# Patient Record
Sex: Male | Born: 1955 | Race: White | Hispanic: No | Marital: Married | Smoking: Never smoker
Health system: Southern US, Community
[De-identification: ages and names within clinical notes are randomized; demographics above are authoritative.]

## PROBLEM LIST (undated history)

## (undated) DIAGNOSIS — E785 Hyperlipidemia, unspecified: Secondary | ICD-10-CM

## (undated) DIAGNOSIS — E119 Type 2 diabetes mellitus without complications: Secondary | ICD-10-CM

## (undated) DIAGNOSIS — I10 Essential (primary) hypertension: Secondary | ICD-10-CM

## (undated) DIAGNOSIS — N189 Chronic kidney disease, unspecified: Secondary | ICD-10-CM

## (undated) DIAGNOSIS — C44329 Squamous cell carcinoma of skin of other parts of face: Secondary | ICD-10-CM

## (undated) DIAGNOSIS — Z9989 Dependence on other enabling machines and devices: Secondary | ICD-10-CM

## (undated) DIAGNOSIS — E781 Pure hyperglyceridemia: Secondary | ICD-10-CM

## (undated) DIAGNOSIS — Z8739 Personal history of other diseases of the musculoskeletal system and connective tissue: Secondary | ICD-10-CM

## (undated) DIAGNOSIS — Z98811 Dental restoration status: Secondary | ICD-10-CM

## (undated) DIAGNOSIS — M1990 Unspecified osteoarthritis, unspecified site: Secondary | ICD-10-CM

## (undated) DIAGNOSIS — E66813 Obesity, class 3: Secondary | ICD-10-CM

## (undated) DIAGNOSIS — G4733 Obstructive sleep apnea (adult) (pediatric): Secondary | ICD-10-CM

## (undated) DIAGNOSIS — H5440 Blindness, one eye, unspecified eye: Secondary | ICD-10-CM

## (undated) DIAGNOSIS — M24562 Contracture, left knee: Secondary | ICD-10-CM

## (undated) DIAGNOSIS — Z87442 Personal history of urinary calculi: Secondary | ICD-10-CM

## (undated) DIAGNOSIS — Z8489 Family history of other specified conditions: Secondary | ICD-10-CM

## (undated) DIAGNOSIS — G4730 Sleep apnea, unspecified: Secondary | ICD-10-CM

## (undated) HISTORY — DX: Hyperlipidemia, unspecified: E78.5

## (undated) HISTORY — DX: Essential (primary) hypertension: I10

## (undated) HISTORY — DX: Type 2 diabetes mellitus without complications: E11.9

## (undated) HISTORY — PX: LIPOMA EXCISION: SHX5283

## (undated) HISTORY — PX: BACK SURGERY: SHX140

## (undated) HISTORY — DX: Morbid (severe) obesity due to excess calories: E66.01

## (undated) HISTORY — PX: INGUINAL HERNIA REPAIR: SUR1180

## (undated) HISTORY — PX: JOINT REPLACEMENT: SHX530

## (undated) HISTORY — DX: Chronic kidney disease, unspecified: N18.9

## (undated) HISTORY — DX: Sleep apnea, unspecified: G47.30

## (undated) HISTORY — DX: Unspecified osteoarthritis, unspecified site: M19.90

## (undated) HISTORY — DX: Obesity, class 3: E66.813

## (undated) HISTORY — PX: KNEE ARTHROSCOPY: SHX127

---
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
## 1998-01-19 ENCOUNTER — Emergency Department: Admit: 1998-01-19 | Payer: Self-pay | Source: Emergency Department | Admitting: Emergency Medicine

## 1998-10-09 ENCOUNTER — Emergency Department: Admit: 1998-10-09 | Payer: Self-pay | Source: Emergency Department | Admitting: Emergency Medicine

## 2001-06-18 ENCOUNTER — Observation Stay
Admission: AD | Admit: 2001-06-18 | Disposition: A | Discharge status: Home / Self Care | Payer: Self-pay | Source: Ambulatory Visit | Admitting: Internal Medicine

## 2004-03-22 ENCOUNTER — Ambulatory Visit
Admit: 2004-03-22 | Disposition: A | Discharge status: Home / Self Care | Payer: Self-pay | Source: Ambulatory Visit | Admitting: Family Medicine

## 2005-05-25 ENCOUNTER — Ambulatory Visit: Payer: Self-pay | Admitting: Family Medicine

## 2005-06-15 ENCOUNTER — Ambulatory Visit: Payer: Self-pay | Admitting: Family Medicine

## 2005-06-22 ENCOUNTER — Ambulatory Visit: Payer: Self-pay | Admitting: Family Medicine

## 2005-06-23 ENCOUNTER — Ambulatory Visit: Payer: Self-pay | Admitting: Pulmonary Disease

## 2005-07-22 ENCOUNTER — Ambulatory Visit (HOSPITAL_BASED_OUTPATIENT_CLINIC_OR_DEPARTMENT_OTHER): Admission: RE | Admit: 2005-07-22 | Discharge: 2005-07-22 | Payer: Self-pay | Admitting: Pulmonary Disease

## 2005-08-01 ENCOUNTER — Ambulatory Visit: Payer: Self-pay | Admitting: Pulmonary Disease

## 2005-08-17 ENCOUNTER — Ambulatory Visit: Payer: Self-pay | Admitting: Gastroenterology

## 2005-08-29 ENCOUNTER — Ambulatory Visit: Payer: Self-pay | Admitting: Gastroenterology

## 2005-08-29 HISTORY — PX: COLONOSCOPY: SHX174

## 2005-09-07 ENCOUNTER — Ambulatory Visit: Payer: Self-pay | Admitting: Pulmonary Disease

## 2005-10-10 ENCOUNTER — Ambulatory Visit: Payer: Self-pay | Admitting: Pulmonary Disease

## 2006-01-13 ENCOUNTER — Ambulatory Visit: Payer: Self-pay | Admitting: Family Medicine

## 2006-01-26 ENCOUNTER — Ambulatory Visit: Payer: Self-pay | Admitting: Pulmonary Disease

## 2006-03-13 ENCOUNTER — Ambulatory Visit: Payer: Self-pay | Admitting: Family Medicine

## 2006-06-12 ENCOUNTER — Ambulatory Visit: Payer: Self-pay | Admitting: Family Medicine

## 2006-09-05 ENCOUNTER — Ambulatory Visit: Payer: Self-pay | Admitting: Family Medicine

## 2006-09-15 ENCOUNTER — Encounter: Admission: RE | Admit: 2006-09-15 | Discharge: 2006-09-15 | Payer: Self-pay | Admitting: Orthopedic Surgery

## 2006-10-27 ENCOUNTER — Ambulatory Visit: Payer: Self-pay | Admitting: Family Medicine

## 2007-03-26 DIAGNOSIS — I10 Essential (primary) hypertension: Secondary | ICD-10-CM

## 2007-03-26 DIAGNOSIS — J309 Allergic rhinitis, unspecified: Secondary | ICD-10-CM | POA: Insufficient documentation

## 2007-04-20 ENCOUNTER — Ambulatory Visit: Payer: Self-pay | Admitting: Family Medicine

## 2007-04-20 DIAGNOSIS — L919 Hypertrophic disorder of the skin, unspecified: Secondary | ICD-10-CM

## 2007-04-20 DIAGNOSIS — E785 Hyperlipidemia, unspecified: Secondary | ICD-10-CM | POA: Insufficient documentation

## 2007-04-20 DIAGNOSIS — L909 Atrophic disorder of skin, unspecified: Secondary | ICD-10-CM | POA: Insufficient documentation

## 2007-04-20 DIAGNOSIS — E119 Type 2 diabetes mellitus without complications: Secondary | ICD-10-CM | POA: Insufficient documentation

## 2007-04-24 LAB — CONVERTED CEMR LAB
BUN: 11 mg/dL (ref 6–23)
CO2: 31 meq/L (ref 19–32)
Creatinine,U: 231.8 mg/dL
GFR calc Af Amer: 114 mL/min
Glucose, Bld: 131 mg/dL — ABNORMAL HIGH (ref 70–99)
Microalb, Ur: 1 mg/dL (ref 0.0–1.9)
Potassium: 3.9 meq/L (ref 3.5–5.1)
Sodium: 139 meq/L (ref 135–145)
VLDL: 38 mg/dL (ref 0–40)

## 2007-05-22 ENCOUNTER — Ambulatory Visit: Payer: Self-pay | Admitting: Pulmonary Disease

## 2007-08-07 ENCOUNTER — Telehealth: Payer: Self-pay | Admitting: Family Medicine

## 2007-08-13 ENCOUNTER — Ambulatory Visit: Payer: Self-pay | Admitting: Family Medicine

## 2007-08-13 DIAGNOSIS — R972 Elevated prostate specific antigen [PSA]: Secondary | ICD-10-CM | POA: Insufficient documentation

## 2007-08-14 LAB — CONVERTED CEMR LAB: PSA: 0.38 ng/mL (ref 0.10–4.00)

## 2007-10-02 ENCOUNTER — Ambulatory Visit: Payer: Self-pay | Admitting: Family Medicine

## 2007-10-02 DIAGNOSIS — M109 Gout, unspecified: Secondary | ICD-10-CM | POA: Insufficient documentation

## 2008-01-08 ENCOUNTER — Telehealth: Payer: Self-pay | Admitting: Internal Medicine

## 2008-05-21 ENCOUNTER — Telehealth: Payer: Self-pay | Admitting: Family Medicine

## 2008-06-16 ENCOUNTER — Telehealth: Payer: Self-pay | Admitting: Family Medicine

## 2008-06-18 ENCOUNTER — Ambulatory Visit: Payer: Self-pay | Admitting: Family Medicine

## 2008-06-18 LAB — CONVERTED CEMR LAB
Ketones, urine, test strip: NEGATIVE
Nitrite: NEGATIVE
Specific Gravity, Urine: 1.015
pH: 8.5

## 2008-06-20 ENCOUNTER — Telehealth: Payer: Self-pay | Admitting: Family Medicine

## 2008-06-20 LAB — CONVERTED CEMR LAB
BUN: 11 mg/dL (ref 6–23)
Basophils Absolute: 0.1 10*3/uL (ref 0.0–0.1)
CO2: 32 meq/L (ref 19–32)
Calcium: 9.1 mg/dL (ref 8.4–10.5)
Cholesterol: 171 mg/dL (ref 0–200)
Creatinine, Ser: 0.8 mg/dL (ref 0.4–1.5)
Eosinophils Absolute: 0.4 10*3/uL (ref 0.0–0.7)
Eosinophils Relative: 6.8 % — ABNORMAL HIGH (ref 0.0–5.0)
Glucose, Bld: 137 mg/dL — ABNORMAL HIGH (ref 70–99)
LDL Cholesterol: 110 mg/dL — ABNORMAL HIGH (ref 0–99)
Lymphocytes Relative: 23.3 % (ref 12.0–46.0)
MCHC: 34.8 g/dL (ref 30.0–36.0)
MCV: 90.8 fL (ref 78.0–100.0)
Monocytes Absolute: 0.6 10*3/uL (ref 0.1–1.0)
Monocytes Relative: 8.7 % (ref 3.0–12.0)
Neutrophils Relative %: 60.4 % (ref 43.0–77.0)
Platelets: 266 10*3/uL (ref 150–400)
Potassium: 3.5 meq/L (ref 3.5–5.1)
Sodium: 142 meq/L (ref 135–145)
Total Protein: 8 g/dL (ref 6.0–8.3)
Triglycerides: 146 mg/dL (ref 0–149)
VLDL: 29 mg/dL (ref 0–40)
WBC: 6.4 10*3/uL (ref 4.5–10.5)

## 2008-06-23 ENCOUNTER — Ambulatory Visit: Payer: Self-pay | Admitting: Family Medicine

## 2008-07-02 ENCOUNTER — Ambulatory Visit: Payer: Self-pay | Admitting: Family Medicine

## 2008-07-02 DIAGNOSIS — M7660 Achilles tendinitis, unspecified leg: Secondary | ICD-10-CM

## 2008-08-26 ENCOUNTER — Ambulatory Visit: Payer: Self-pay | Admitting: Family Medicine

## 2009-07-15 ENCOUNTER — Telehealth: Payer: Self-pay | Admitting: Family Medicine

## 2009-07-24 ENCOUNTER — Ambulatory Visit: Payer: Self-pay | Admitting: Family Medicine

## 2009-07-27 LAB — CONVERTED CEMR LAB
ALT: 29 units/L (ref 0–53)
Albumin: 4.2 g/dL (ref 3.5–5.2)
Alkaline Phosphatase: 47 units/L (ref 39–117)
Basophils Absolute: 0.1 10*3/uL (ref 0.0–0.1)
Basophils Relative: 0.8 % (ref 0.0–3.0)
Chloride: 101 meq/L (ref 96–112)
Cholesterol: 177 mg/dL (ref 0–200)
Creatinine, Ser: 0.8 mg/dL (ref 0.4–1.5)
Eosinophils Absolute: 0.5 10*3/uL (ref 0.0–0.7)
Eosinophils Relative: 7.2 % — ABNORMAL HIGH (ref 0.0–5.0)
HCT: 43.5 % (ref 39.0–52.0)
Lymphocytes Relative: 25.6 % (ref 12.0–46.0)
Lymphs Abs: 1.8 10*3/uL (ref 0.7–4.0)
Microalb Creat Ratio: 4.3 mg/g (ref 0.0–30.0)
Monocytes Relative: 7.9 % (ref 3.0–12.0)
PSA: 0.49 ng/mL (ref 0.10–4.00)
Potassium: 2.9 meq/L — ABNORMAL LOW (ref 3.5–5.1)
RBC: 4.61 M/uL (ref 4.22–5.81)
RDW: 12.8 % (ref 11.5–14.6)
Sodium: 138 meq/L (ref 135–145)
Total Bilirubin: 0.8 mg/dL (ref 0.3–1.2)
Total CHOL/HDL Ratio: 5
Total Protein: 8.3 g/dL (ref 6.0–8.3)
Triglycerides: 203 mg/dL — ABNORMAL HIGH (ref 0.0–149.0)
VLDL: 40.6 mg/dL — ABNORMAL HIGH (ref 0.0–40.0)

## 2009-07-28 ENCOUNTER — Ambulatory Visit: Payer: Self-pay | Admitting: Family Medicine

## 2009-08-04 DIAGNOSIS — R3129 Other microscopic hematuria: Secondary | ICD-10-CM | POA: Insufficient documentation

## 2009-08-04 LAB — CONVERTED CEMR LAB
Bilirubin Urine: NEGATIVE
Glucose, Urine, Semiquant: NEGATIVE
Ketones, urine, test strip: NEGATIVE
Nitrite: NEGATIVE
Protein, U semiquant: NEGATIVE
Specific Gravity, Urine: 1.02
Urobilinogen, UA: 1
WBC Urine, dipstick: NEGATIVE
pH: 6.5

## 2009-08-15 HISTORY — PX: CYSTOSCOPY: SUR368

## 2009-08-24 ENCOUNTER — Encounter: Payer: Self-pay | Admitting: Family Medicine

## 2009-09-04 ENCOUNTER — Encounter: Payer: Self-pay | Admitting: Family Medicine

## 2009-09-24 ENCOUNTER — Encounter: Payer: Self-pay | Admitting: Family Medicine

## 2009-10-21 ENCOUNTER — Ambulatory Visit: Payer: Self-pay | Admitting: Family Medicine

## 2009-10-22 LAB — CONVERTED CEMR LAB
ALT: 39 units/L (ref 0–53)
Albumin: 3.9 g/dL (ref 3.5–5.2)
Alkaline Phosphatase: 56 units/L (ref 39–117)
BUN: 15 mg/dL (ref 6–23)
Basophils Absolute: 0.1 10*3/uL (ref 0.0–0.1)
Bilirubin Urine: NEGATIVE
CO2: 28 meq/L (ref 19–32)
Creatinine, Ser: 0.8 mg/dL (ref 0.4–1.5)
Eosinophils Absolute: 0.5 10*3/uL (ref 0.0–0.7)
Eosinophils Relative: 7 % — ABNORMAL HIGH (ref 0.0–5.0)
HCT: 40.6 % (ref 39.0–52.0)
Ketones, ur: NEGATIVE mg/dL
Leukocytes, UA: NEGATIVE
Lymphocytes Relative: 27 % (ref 12.0–46.0)
MCV: 91.9 fL (ref 78.0–100.0)
Monocytes Relative: 9.9 % (ref 3.0–12.0)
Neutrophils Relative %: 54.9 % (ref 43.0–77.0)
RBC: 4.42 M/uL (ref 4.22–5.81)
RDW: 12.5 % (ref 11.5–14.6)
TSH: 1.86 microintl units/mL (ref 0.35–5.50)
Total Protein, Urine: NEGATIVE mg/dL
Total Protein: 7.6 g/dL (ref 6.0–8.3)
Urine Glucose: NEGATIVE mg/dL
VLDL: 80 mg/dL — ABNORMAL HIGH (ref 0.0–40.0)
pH: 6 (ref 5.0–8.0)

## 2009-10-28 ENCOUNTER — Ambulatory Visit: Payer: Self-pay | Admitting: Family Medicine

## 2009-10-28 DIAGNOSIS — M1712 Unilateral primary osteoarthritis, left knee: Secondary | ICD-10-CM | POA: Insufficient documentation

## 2009-10-30 ENCOUNTER — Ambulatory Visit: Payer: Self-pay | Admitting: Family Medicine

## 2010-06-23 ENCOUNTER — Ambulatory Visit: Payer: Self-pay | Admitting: Family Medicine

## 2010-06-24 LAB — CONVERTED CEMR LAB
AST: 27 units/L (ref 0–37)
BUN: 17 mg/dL (ref 6–23)
Bilirubin, Direct: 0.1 mg/dL (ref 0.0–0.3)
CO2: 31 meq/L (ref 19–32)
Calcium: 9.7 mg/dL (ref 8.4–10.5)
Chloride: 102 meq/L (ref 96–112)
Cholesterol: 165 mg/dL (ref 0–200)
Creatinine, Ser: 0.8 mg/dL (ref 0.4–1.5)
HCT: 43 % (ref 39.0–52.0)
HDL: 34.8 mg/dL — ABNORMAL LOW (ref >39.00)
Hgb A1c MFr Bld: 6.4 % (ref 4.6–6.5)
LDL Cholesterol: 92 mg/dL (ref 0–99)
Lymphocytes Relative: 27.3 % (ref 12.0–46.0)
MCHC: 34.5 g/dL (ref 30.0–36.0)
Neutrophils Relative %: 58.7 % (ref 43.0–77.0)
Potassium: 4.4 meq/L (ref 3.5–5.1)
RBC: 4.67 M/uL (ref 4.22–5.81)
Sodium: 142 meq/L (ref 135–145)
Total Bilirubin: 0.6 mg/dL (ref 0.3–1.2)
Total CHOL/HDL Ratio: 5
Triglycerides: 193 mg/dL — ABNORMAL HIGH (ref 0.0–149.0)

## 2010-09-02 ENCOUNTER — Telehealth: Payer: Self-pay | Admitting: Family Medicine

## 2010-09-13 ENCOUNTER — Telehealth: Payer: Self-pay | Admitting: Family Medicine

## 2010-09-14 NOTE — Assessment & Plan Note (Signed)
Summary: med check/refills/pt coming in fasting/cjr   Vital Signs:  Patient profile:   55 year old male Weight:      315 pounds O2 Sat:      94 % Temp:     97.9 degrees F Pulse rate:   73 / minute BP sitting:   134 / 80  (left arm) Cuff size:   large  Vitals Entered By: Pura Spice, RN (June 23, 2010 8:43 AM)  Contraindications/Deferment of Procedures/Staging:    Test/Procedure: FLU VAX    Reason for deferment: patient declined  CC: med ck and refils states has not taking tricor or k+ since august   History of Present Illness: Here for follow up. He is fasting. He feels well although he is still putting up with chronic left knee pain. uses Advil as needed . He stopped taking the Tricor and the potassium pills 3 months ago, and he cannot really tell me why today. he does not check his BP or his glucoses.   Allergies: No Known Drug Allergies  Past History:  Past Medical History: Reviewed history from 10/28/2009 and no changes required. Allergic rhinitis Hypertension Gout Diabetes mellitus, type II Hyperlipidemia Osteoarthritis microhematuria, benign, worked up with cystoscopy and CT 09-2009 per Dr. Larey Dresser  Review of Systems  The patient denies anorexia, fever, weight loss, weight gain, vision loss, decreased hearing, hoarseness, chest pain, syncope, dyspnea on exertion, peripheral edema, prolonged cough, headaches, hemoptysis, abdominal pain, melena, hematochezia, severe indigestion/heartburn, hematuria, incontinence, genital sores, muscle weakness, suspicious skin lesions, transient blindness, difficulty walking, depression, unusual weight change, abnormal bleeding, enlarged lymph nodes, angioedema, breast masses, and testicular masses.    Physical Exam  General:  overweight-appearing.   Neck:  No deformities, masses, or tenderness noted. Lungs:  Normal respiratory effort, chest expands symmetrically. Lungs are clear to auscultation, no crackles or  wheezes. Heart:  Normal rate and regular rhythm. S1 and S2 normal without gallop, murmur, click, rub or other extra sounds.   Impression & Recommendations:  Problem # 1:  DIABETES MELLITUS, TYPE II (ICD-250.00)  His updated medication list for this problem includes:    Metformin Hcl 500 Mg Tabs (Metformin hcl) ..... One by mouth two times a day  Orders: Venipuncture (16109) TLB-Lipid Panel (80061-LIPID) TLB-BMP (Basic Metabolic Panel-BMET) (80048-METABOL) TLB-CBC Platelet - w/Differential (85025-CBCD) TLB-Hepatic/Liver Function Pnl (80076-HEPATIC) TLB-TSH (Thyroid Stimulating Hormone) (84443-TSH) TLB-A1C / Hgb A1C (Glycohemoglobin) (83036-A1C)  Problem # 2:  HYPERLIPIDEMIA (ICD-272.4)  His updated medication list for this problem includes:    Tricor 145 Mg Tabs (Fenofibrate) .Marland Kitchen... 1 by mouth once daily  Problem # 3:  HYPERTENSION (ICD-401.9)  His updated medication list for this problem includes:    Tenoretic 50 50-25 Mg Tabs (Atenolol-chlorthalidone) .Marland Kitchen... Take 1 tablet by mouth once a day  Complete Medication List: 1)  Tenoretic 50 50-25 Mg Tabs (Atenolol-chlorthalidone) .... Take 1 tablet by mouth once a day 2)  Metformin Hcl 500 Mg Tabs (Metformin hcl) .... One by mouth two times a day 3)  Klor-con M20 20 Meq Cr-tabs (Potassium chloride crys cr) .Marland Kitchen.. 1 by mouth two times a day 4)  Tricor 145 Mg Tabs (Fenofibrate) .Marland Kitchen.. 1 by mouth once daily  Patient Instructions: 1)  get labs today. 2)  It is important that you exercise reguarly at least 20 minutes 5 times a week. If you develop chest pain, have severe difficulty breathing, or feel very tired, stop exercising immediately and seek medical attention.  3)  You need to  lose weight. Consider a lower calorie diet and regular exercise.  Prescriptions: METFORMIN HCL 500 MG TABS (METFORMIN HCL) one by mouth two times a day  #60 x 11   Entered and Authorized by:   Nelwyn Salisbury MD   Signed by:   Nelwyn Salisbury MD on 06/23/2010    Method used:   Electronically to        [facility] [address] [phone]* (retail)       [address]       [place], [place]  [address]       Ph: [phone] or [phone]       Fax: [phone]   RxID:   [id number] TENORETIC 50 50-25 MG  TABS (ATENOLOL-CHLORTHALIDONE) Take 1 tablet by mouth once a day  #30 x 11   Entered and Authorized by:   Nelwyn Salisbury MD   Signed by:   Nelwyn Salisbury MD on 06/23/2010   Method used:   Electronically to        [facility] [address] [phone]* (retail)       [address]       [place], [place]  [address]       Ph: [phone] or [phone]       Fax: [phone]   RxID:   [id number]    Orders Added: 1)  Est. Patient Level IV [70623] 2)  Venipuncture [76283] 3)  TLB-Lipid Panel [80061-LIPID] 4)  TLB-BMP (Basic Metabolic Panel-BMET) [80048-METABOL] 5)  TLB-CBC Platelet - w/Differential [85025-CBCD] 6)  TLB-Hepatic/Liver Function Pnl [80076-HEPATIC] 7)  TLB-TSH (Thyroid Stimulating Hormone) [84443-TSH] 8)  TLB-A1C / Hgb A1C (Glycohemoglobin) [83036-A1C]  Appended Document: Orders Update    Clinical Lists Changes  Orders: Added new Service order of Specimen Handling (15176) - Signed

## 2010-09-14 NOTE — Letter (Signed)
Summary: [facility]  [facility]   Imported By: Maryln Gottron 09/30/2009 14:34:12  _____________________________________________________________________  External Attachment:    Type:   Image     Comment:   External Document

## 2010-09-14 NOTE — Assessment & Plan Note (Signed)
Summary: [name] // RS   Vital Signs:  Patient profile:   55 year old male Weight:      316 pounds BMI:     43.01 BP sitting:   138 / 70  (left arm) Cuff size:   large  Vitals Entered By: Raechel Ache, RN (October 28, 2009 1:43 PM) CC: [name], labs done.   History of Present Illness: 55 yr old male for cpx. He feels good in general except for his chronic left knee pain. he has severe end stage arthritis in the knee, and the onpy real next step is a total replacement. he has seen Dr. August Saucer about htis in the pasy, and now he is considering getting a second opinion from Dr. Farris Has. He tries to watch his diet but gets very little exercise.   Preventive Screening-Counseling & Management  Alcohol-Tobacco     Smoking Status: never  Allergies (verified): No Known Drug Allergies  Past History:  Past Medical History: Allergic rhinitis Hypertension Gout Diabetes mellitus, type II Hyperlipidemia Osteoarthritis microhematuria, benign, worked up with cystoscopy and CT 09-2009 per Dr. Larey Dresser  Past Surgical History: traumatic loss of left eye 1961 left inguinal hernia repair age 25 excision of lipoma left upper back 1997 left knee arthroscopy 1999 colonoscopy 08-29-05 per Dr. Jarold Motto, diverticulosis only, repeat in 10 yrs cystoscopy 09-2009 per Dr. Vonita Moss, clear  Family History: Reviewed history and no changes required. Family History of Alcoholism/Addiction Family History of CAD Male 1st degree relative <50 Family History Hypertension Family History Kidney disease Family History of Stroke F 1st degree relative <60 lupus  Social History: Reviewed history and no changes required. Occupation: Psychologist, occupational Married Never Smoked Alcohol use-yes Occupation:  employed Smoking Status:  never  Review of Systems  The patient denies anorexia, fever, weight loss, weight gain, vision loss, decreased hearing, hoarseness, chest pain, syncope, dyspnea on exertion, peripheral edema,  prolonged cough, headaches, hemoptysis, abdominal pain, melena, hematochezia, severe indigestion/heartburn, hematuria, incontinence, genital sores, muscle weakness, suspicious skin lesions, transient blindness, difficulty walking, depression, unusual weight change, abnormal bleeding, enlarged lymph nodes, angioedema, breast masses, and testicular masses.    Physical Exam  General:  overweight-appearing.   Head:  Normocephalic and atraumatic without obvious abnormalities. No apparent alopecia or balding. Eyes:  No corneal or conjunctival inflammation noted. EOMI. Perrla. Funduscopic exam benign, without hemorrhages, exudates or papilledema. Vision grossly normal. Ears:  External ear exam shows no significant lesions or deformities.  Otoscopic examination reveals clear canals, tympanic membranes are intact bilaterally without bulging, retraction, inflammation or discharge. Hearing is grossly normal bilaterally. Nose:  External nasal examination shows no deformity or inflammation. Nasal mucosa are pink and moist without lesions or exudates. Mouth:  Oral mucosa and oropharynx without lesions or exudates.  Teeth in good repair. Neck:  No deformities, masses, or tenderness noted. Chest Wall:  No deformities, masses, tenderness or gynecomastia noted. Lungs:  Normal respiratory effort, chest expands symmetrically. Lungs are clear to auscultation, no crackles or wheezes. Heart:  Normal rate and regular rhythm. S1 and S2 normal without gallop, murmur, click, rub or other extra sounds. EKG normal Abdomen:  Bowel sounds positive,abdomen soft and non-tender without masses, organomegaly or hernias noted. Rectal:  No external abnormalities noted. Normal sphincter tone. No rectal masses or tenderness. Heme neg.  Genitalia:  Testes bilaterally descended without nodularity, tenderness or masses. No scrotal masses or lesions. No penis lesions or urethral discharge. Prostate:  Prostate gland firm and smooth, no  enlargement, nodularity, tenderness, mass, asymmetry or  induration. Msk:  No deformity or scoliosis noted of thoracic or lumbar spine.   Pulses:  R and L carotid,radial,femoral,dorsalis pedis and posterior tibial pulses are full and equal bilaterally Extremities:  No clubbing, cyanosis, edema, or deformity noted with normal full range of motion of all joints.   Neurologic:  No cranial nerve deficits noted. Station and gait are normal. Plantar reflexes are down-going bilaterally. DTRs are symmetrical throughout. Sensory, motor and coordinative functions appear intact. Skin:  Intact without suspicious lesions or rashes Cervical Nodes:  No lymphadenopathy noted Axillary Nodes:  No palpable lymphadenopathy Inguinal Nodes:  No significant adenopathy Psych:  Cognition and judgment appear intact. Alert and cooperative with normal attention span and concentration. No apparent delusions, illusions, hallucinations   Impression & Recommendations:  Problem # 1:  PHYSICAL EXAMINATION (ICD-V70.0)  Orders: EKG w/ Interpretation (93000) Hemoccult Guaiac-1 spec.(in office) (82270)  Complete Medication List: 1)  Tenoretic 50 50-25 Mg Tabs (Atenolol-chlorthalidone) .... Take 1 tablet by mouth once a day 2)  Metformin Hcl 500 Mg Tabs (Metformin hcl) .... One by mouth two times a day 3)  Klor-con M20 20 Meq Cr-tabs (Potassium chloride crys cr) .Marland Kitchen.. 1 by mouth two times a day 4)  Tricor 145 Mg Tabs (Fenofibrate) .Marland Kitchen.. 1 by mouth once daily  Other Orders: Tdap => 17yrs IM (36644) Admin 1st Vaccine (03474)  Patient Instructions: 1)  It is important that you exercise reguarly at least 20 minutes 5 times a week. If you develop chest pain, have severe difficulty breathing, or feel very tired, stop exercising immediately and seek medical attention.  2)  You need to lose weight. Consider a lower calorie diet and regular exercise.  3)  we will draw an A1c soon to follow his diabetes.  4)  See Dr. Farris Has about his  knee   Immunizations Administered:  Tetanus Vaccine:    Vaccine Type: Tdap    Site: left deltoid    Mfr: GlaxoSmithKline    Dose: 0.5 ml    Route: IM    Given by: Raechel Ache, RN    Exp. Date: 06/10/2010    Lot #: [id number]    VIS given: 07/03/07 version given October 28, 2009.

## 2010-09-14 NOTE — Consult Note (Signed)
Summary: [facility]  [facility]   Imported By: Maryln Gottron 09/01/2009 10:28:49  _____________________________________________________________________  External Attachment:    Type:   Image     Comment:   External Document

## 2010-09-16 NOTE — Progress Notes (Signed)
Summary: please send  alternate  Phone Note Call from Patient Call back at Work Phone [phone]   Caller: Patient---live call Summary of Call:  atenolol/hctz is no longer available at his pharmacy. please of alternate. please send to [facility]. pt is out of med. Initial call taken by: Warnell Forester,  September 02, 2010 4:28 PM  Follow-up for Phone Call        [facility] wants to know if can split it to atenolol and chlorthalidone  separately? Follow-up by: Pura Spice, RN,  September 03, 2010 8:04 AM  Additional Follow-up for Phone Call Additional follow up Details #1::        okay, chage to Chlorthalidone 25 mg once daily and Atenolol 50 mg once daily . Call in one year of both  Additional Follow-up by: Nelwyn Salisbury MD,  September 03, 2010 2:30 PM    Additional Follow-up for Phone Call Additional follow up Details #2::    called in  Follow-up by: Pura Spice, RN,  September 03, 2010 2:50 PM  New/Updated Medications: ATENOLOL 50 MG TABS (ATENOLOL) 1 by mouth once daily CHLORTHALIDONE 25 MG TABS (CHLORTHALIDONE) 1 by mouth once daily Prescriptions: CHLORTHALIDONE 25 MG TABS (CHLORTHALIDONE) 1 by mouth once daily  #30 x 11   Entered by:   Pura Spice, RN   Authorized by:   Nelwyn Salisbury MD   Signed by:   Pura Spice, RN on 09/03/2010   Method used:   Electronically to        [facility] [phone]* (retail)       [address]       [place], [place]  [address]       Ph: [phone]       Fax: [phone]   RxID:   [id number] ATENOLOL 50 MG TABS (ATENOLOL) 1 by mouth once daily  #30 x 11   Entered by:   Pura Spice, RN   Authorized by:   Nelwyn Salisbury MD   Signed by:   Pura Spice, RN on 09/03/2010   Method used:   Electronically to        [facility] [phone]* (retail)       [address]       [place], [place]  [address]       Ph: [phone]       Fax: [phone]   RxID:   [id number]

## 2010-09-22 NOTE — Progress Notes (Signed)
Summary: ? MED   Phone Note Call from Patient   Caller: Patient Call For: Nelwyn Salisbury MD Summary of Call: Pt is having a gout flare in Left big toe.  Extremely painful.  Is taking Atenolol and wonders if that is the reason it is worse. Karin Golden ([place])  Initial call taken by: Lynann Beaver CMA AAMA,  September 13, 2010 8:27 AM  Follow-up for Phone Call        No I doubt the Atenolol has much to do with this  Follow-up by: Nelwyn Salisbury MD,  September 13, 2010 8:55 AM  Additional Follow-up for Phone Call Additional follow up Details #1::        Needs meds, please. Pt is calling back for meds.....states in a lot of pain. Additional Follow-up by: Lynann Beaver CMA AAMA,  September 13, 2010 10:53 AM    Additional Follow-up for Phone Call Additional follow up Details #2::    call in Indocin 50 mg three times a day as needed for gout, #60 with 5 rf  Follow-up by: Nelwyn Salisbury MD,  September 13, 2010 10:06 AM  New/Updated Medications: INDOMETHACIN 50 MG CAPS (INDOMETHACIN) one three times a day Prescriptions: INDOMETHACIN 50 MG CAPS (INDOMETHACIN) one three times a day  #60 x 3   Entered by:   Lynann Beaver CMA AAMA   Authorized by:   Nelwyn Salisbury MD   Signed by:   Lynann Beaver CMA AAMA on 09/13/2010   Method used:   Electronically to        [facility] [address] (retail)       [address]       [place], [place]  [address]       Ph: [phone] or [phone]       Fax: [phone]   RxID:   [id number]  Notified pt.

## 2010-12-31 NOTE — Procedures (Signed)
NAME:  Jermaine James, BOZE NO.:  [id number]   MEDICAL RECORD NO.:  [id number]          PATIENT TYPE:  OUT   LOCATION:  SLEEP CENTER                 FACILITY:  [facility]   PHYSICIAN:  Marcelyn Bruins, M.D. [facility] DATE OF BIRTH:  04/22/1956   DATE OF STUDY:  07/22/2005                              NOCTURNAL POLYSOMNOGRAM   REFERRING PHYSICIAN:  Marcelyn Bruins, M.D. [facility].   DATE OF STUDY:  July 22, 2005.   INDICATION FOR STUDY:  Hypersomnia with sleep apnea.   EPWORTH SLEEPINESS SCORE:  10.   SLEEP ARCHITECTURE:  The patient had a total sleep time of 284 minutes but  never achieved REM or slow wave sleep. Sleep onset latency was normal at 12  minutes. Sleep efficiency was poor and quite fragmented at 75%.   RESPIRATORY DATA:  The patient was found to have 466 apneas with 1 central  apnea and 12 hypopneas. This gave the patient a respiratory disturbance  index of 101 events per hour. Events were not positional but there was  moderate snoring noted.   OXYGEN DATA:  The patient had O2 desaturation as low as 69% with his  obstructive events.   CARDIAC DATA:  No clinically significant cardiac arrhythmias.   MOVEMENT/PARASOMNIAS:  None.   IMPRESSION/RECOMMENDATIONS:  Very severe obstructive sleep apnea with a  respiratory disturbance index of 101 events per hour and O2 desaturation as  low as 69%. Treatment for this degree of sleep apnea should focus on weight  loss coupled with C-PAP.           ______________________________  Marcelyn Bruins, M.D. [facility]  Diplomate, American Board of Sleep  Medicine     KC/MEDQ  D:  08/03/2005 11:32:45  T:  08/03/2005 22:41:39  Job:  [id number]

## 2011-02-14 ENCOUNTER — Ambulatory Visit (HOSPITAL_COMMUNITY)
Admission: RE | Admit: 2011-02-14 | Discharge: 2011-02-14 | Disposition: A | Discharge status: Home / Self Care | Payer: BC Managed Care – PPO | Source: Ambulatory Visit | Attending: Family Medicine | Admitting: Family Medicine

## 2011-02-14 ENCOUNTER — Ambulatory Visit (INDEPENDENT_AMBULATORY_CARE_PROVIDER_SITE_OTHER): Payer: BC Managed Care – PPO | Admitting: Family Medicine

## 2011-02-14 ENCOUNTER — Encounter: Payer: Self-pay | Admitting: Family Medicine

## 2011-02-14 VITALS — BP 110/72 | HR 86 | Temp 97.7°F

## 2011-02-14 DIAGNOSIS — M545 Low back pain, unspecified: Secondary | ICD-10-CM | POA: Insufficient documentation

## 2011-02-14 DIAGNOSIS — M25559 Pain in unspecified hip: Secondary | ICD-10-CM

## 2011-02-14 DIAGNOSIS — M25551 Pain in right hip: Secondary | ICD-10-CM

## 2011-02-14 DIAGNOSIS — M47817 Spondylosis without myelopathy or radiculopathy, lumbosacral region: Secondary | ICD-10-CM | POA: Insufficient documentation

## 2011-02-14 MED ORDER — HYDROCODONE-ACETAMINOPHEN 7.5-500 MG PO TABS: 1.0000 | ORAL_TABLET | Freq: Four times a day (QID) | ORAL | Status: AC | PRN

## 2011-02-14 MED ORDER — KETOROLAC TROMETHAMINE 60 MG/2ML IM SOLN
60.0000 mg | Freq: Once | INTRAMUSCULAR | Status: AC
  Administered 2011-02-14: 60 mg via INTRAMUSCULAR

## 2011-02-14 MED ORDER — PREDNISONE (PAK) 10 MG PO TABS: ORAL_TABLET | ORAL | Status: DC

## 2011-02-14 NOTE — Progress Notes (Signed)
  Subjective:    Patient ID: Jermaine James, male    DOB: 01/28/1956, 55 y.o.   MRN: [id number]  HPI Here for the sudden onset of severe sharp pain in the right lower back just above the hip area. This started 3 days ago when he woke up with it. No recent falls or trauma. He has never felt this before. The pain does not radiate anywhere. No leg symptoms. Using Motrin.    Review of Systems  Constitutional: Negative.   Musculoskeletal: Positive for back pain.       Objective:   Physical Exam  Constitutional:       In severe pain, can barely walk   Musculoskeletal:       Tender over the left lower back just above the pelvis. Some spasm is present. Reduced ROM of the spine. The hip appears to be intact          Assessment & Plan:  This seems to be coming from the lower spine, but we will get Xrays of the lumbar spine and hip. Use prednisone and Vicodin. Use ice packs.

## 2011-02-15 ENCOUNTER — Telehealth: Payer: Self-pay | Admitting: Family Medicine

## 2011-02-15 NOTE — Telephone Encounter (Signed)
Pt called req to get xray results. Pls call asap.  °

## 2011-02-17 ENCOUNTER — Telehealth: Payer: Self-pay | Admitting: *Deleted

## 2011-02-17 NOTE — Progress Notes (Signed)
See phone note

## 2011-02-17 NOTE — Telephone Encounter (Signed)
Spoke with pt and gave results. 

## 2011-02-17 NOTE — Telephone Encounter (Signed)
See the report. 

## 2011-02-17 NOTE — Telephone Encounter (Signed)
I spoke with pt and gave results. He is doing much better.

## 2011-02-17 NOTE — Telephone Encounter (Signed)
Message copied by Romualdo Bolk on Thu Feb 17, 2011 11:36 AM ------      Message from: Gershon Crane A      Created: Tue Feb 15, 2011 12:07 PM       Mild arthritis only. If the pain does not improve in a few days, have him see me again

## 2011-02-17 NOTE — Telephone Encounter (Signed)
Left message to call back  

## 2011-03-17 ENCOUNTER — Telehealth: Payer: Self-pay | Admitting: Family Medicine

## 2011-03-17 DIAGNOSIS — E119 Type 2 diabetes mellitus without complications: Secondary | ICD-10-CM

## 2011-03-17 NOTE — Telephone Encounter (Signed)
The referral was ordered and DM said they would contact the pt.

## 2011-03-17 NOTE — Telephone Encounter (Signed)
Diabetes Management called and stated that pt should have a referral order.

## 2011-03-17 NOTE — Telephone Encounter (Signed)
done

## 2011-04-14 ENCOUNTER — Encounter: Payer: BC Managed Care – PPO | Attending: Family Medicine | Admitting: *Deleted

## 2011-04-14 DIAGNOSIS — Z713 Dietary counseling and surveillance: Secondary | ICD-10-CM | POA: Insufficient documentation

## 2011-04-14 DIAGNOSIS — E119 Type 2 diabetes mellitus without complications: Secondary | ICD-10-CM | POA: Insufficient documentation

## 2011-04-14 NOTE — Progress Notes (Signed)
  Medical Nutrition Therapy:  Appt start time:  8:00a  end time:  9:00a  Assessment:  Primary concerns today: T2DM. Pt here for T2DM education (most recent A1c 6.4%; 06/2008).  Reports many dietary changes since last A1c. Used to drink (3-4) 20 oz regular cokes daily; none since 04/2010. Loves ice cream and has daily for dessert (1.5 c).  Has decreased red meat, eats more chicken.  Eats out daily for lunch and often dinner too.  Excessive CHO noted in recall.  Pt exercises 2-3 d/wk at pool.   MEDICATIONS:  . atenolol (TENORMIN) 50 MG tablet  . chlorthalidone (HYGROTON) 25 MG tablet  . metFORMIN (GLUCOPHAGE) 500 MG tablet  . indomethacin (INDOCIN) 50 MG capsule  . predniSONE (DELTASONE) 10 MG tablet pack    DIETARY INTAKE:  Usual eating pattern includes 3 meals and 1-2 snacks per day.  Everyday foods include ice cream.  Avoided foods include none.   24-hr recall:  B (AM): Cheerios (1.5 c) w/ 1% milk; sometimes OJ (not often)  Snk (AM): 20 oz water  L (PM): Eats out daily; [employer] or diner - tuna on wheat (6"), chips; unsweet tea w/ 1 sugar pkt Snk ( PM): None or 2-3 hersheys "chunks"; rest of tea from lunch D (PM): Kick [employer] - chicken tenders and shrimp w/ honey mustard, salad w/ thousand island dressing, (1) beer OR grilled chicken or steak w/ green beans, corn OR pasta;  Snk ( PM): ice cream  Usual physical activity: 2-3x/week at gym - pool walking, swimming.  Estimated energy needs: 1600-1800 calories 180-200 g carbohydrates 120-130 g protein 45-50 g fat  Progress Towards Goal(s):  NEW.   Nutritional Diagnosis:  [place]-2.1 Inpaired nutrition utilization related to excessive CHO intake as evidenced by patient-reported 24-hour dietary and MD referral for DM assessment.    Intervention/Goals:  Follow Diabetes Meal Plan as instructed (see yellow card).  Limit carbohydrate intake to 60 grams/meal and 15 grams/snack.  Add lean protein foods to all meals/snacks.  Daily Nutrient  Goals: 25-30 g fiber, <2000 mg sodium, <300 mg dietary cholesterol  Limit concentrated sweets, high fat/fried foods, and meals away from home.   Aim for 60 mins of physical activity daily (2-3 day/week, then gradually increase as able).  Handouts given during visit include:  60 g CHO menu  CHO/Pro snack list  Understanding DM booklet ([employer])  Foot care handout  Fiber foods list  Monitoring/Evaluation:  Dietary intake, exercise, A1c, and body weight in 4-6 week(s).

## 2011-04-14 NOTE — Patient Instructions (Addendum)
Goals:  Follow Diabetes Meal Plan as instructed (see yellow card).  Limit carbohydrate intake to 60 grams/meal and 15 grams/snack.  Add lean protein foods to all meals/snacks.  Daily Nutrient Goals: 25-30 g fiber, <2000 mg sodium, <300 mg dietary cholesterol  Limit concentrated sweets, high fat/fried foods, and meals away from home.   Aim for 60 mins of physical activity daily (2-3 day/week, then gradually increase as able).

## 2011-04-15 ENCOUNTER — Encounter: Payer: Self-pay | Admitting: *Deleted

## 2011-07-04 ENCOUNTER — Encounter: Payer: Self-pay | Admitting: Family Medicine

## 2011-07-04 ENCOUNTER — Ambulatory Visit (INDEPENDENT_AMBULATORY_CARE_PROVIDER_SITE_OTHER): Payer: BC Managed Care – PPO | Admitting: Family Medicine

## 2011-07-04 VITALS — BP 128/84 | HR 85 | Temp 98.4°F | Wt 314.0 lb

## 2011-07-04 DIAGNOSIS — E119 Type 2 diabetes mellitus without complications: Secondary | ICD-10-CM

## 2011-07-04 DIAGNOSIS — I10 Essential (primary) hypertension: Secondary | ICD-10-CM

## 2011-07-04 DIAGNOSIS — N139 Obstructive and reflux uropathy, unspecified: Secondary | ICD-10-CM

## 2011-07-04 DIAGNOSIS — N401 Enlarged prostate with lower urinary tract symptoms: Secondary | ICD-10-CM

## 2011-07-04 MED ORDER — METFORMIN HCL 500 MG PO TABS: 500.0000 mg | ORAL_TABLET | Freq: Two times a day (BID) | ORAL | Status: DC

## 2011-07-04 MED ORDER — ATENOLOL-CHLORTHALIDONE 50-25 MG PO TABS: 1.0000 | ORAL_TABLET | Freq: Every day | ORAL | Status: DC

## 2011-07-04 NOTE — Progress Notes (Signed)
  Subjective:    Patient ID: Jermaine James, male    DOB: 1956/04/07, 55 y.o.   MRN: [id number]  HPI Doing well. Trying to watch his diet. No complaints.    Review of Systems  Constitutional: Negative.   Respiratory: Negative.   Cardiovascular: Negative.        Objective:   Physical Exam  Constitutional: He appears well-developed and well-nourished.  Neck: No thyromegaly present.  Cardiovascular: Normal rate, regular rhythm, normal heart sounds and intact distal pulses.   Pulmonary/Chest: Effort normal and breath sounds normal.  Lymphadenopathy:    He has no cervical adenopathy.          Assessment & Plan:  Get more exercise. Set up fasting labs

## 2011-07-05 ENCOUNTER — Other Ambulatory Visit (INDEPENDENT_AMBULATORY_CARE_PROVIDER_SITE_OTHER): Payer: BC Managed Care – PPO

## 2011-07-05 ENCOUNTER — Other Ambulatory Visit: Payer: Self-pay | Admitting: Family Medicine

## 2011-07-05 DIAGNOSIS — N138 Other obstructive and reflux uropathy: Secondary | ICD-10-CM

## 2011-07-05 DIAGNOSIS — E119 Type 2 diabetes mellitus without complications: Secondary | ICD-10-CM

## 2011-07-05 LAB — HEMOGLOBIN A1C: Hgb A1c MFr Bld: 6.6 % — ABNORMAL HIGH (ref 4.6–6.5)

## 2011-07-05 LAB — TSH: TSH: 1.65 u[IU]/mL (ref 0.35–5.50)

## 2011-07-05 LAB — HEPATIC FUNCTION PANEL
ALT: 31 U/L (ref 0–53)
AST: 25 U/L (ref 0–37)
Alkaline Phosphatase: 55 U/L (ref 39–117)
Bilirubin, Direct: 0.1 mg/dL (ref 0.0–0.3)
Total Bilirubin: 0.7 mg/dL (ref 0.3–1.2)

## 2011-07-05 LAB — CBC WITH DIFFERENTIAL/PLATELET
Eosinophils Relative: 5.9 % — ABNORMAL HIGH (ref 0.0–5.0)
Lymphocytes Relative: 28.4 % (ref 12.0–46.0)
Monocytes Absolute: 0.6 10*3/uL (ref 0.1–1.0)
Monocytes Relative: 7.5 % (ref 3.0–12.0)
Neutrophils Relative %: 57.5 % (ref 43.0–77.0)
Platelets: 272 10*3/uL (ref 150.0–400.0)
WBC: 7.5 10*3/uL (ref 4.5–10.5)

## 2011-07-05 LAB — BASIC METABOLIC PANEL
BUN: 17 mg/dL (ref 6–23)
Creatinine, Ser: 0.9 mg/dL (ref 0.4–1.5)
Glucose, Bld: 135 mg/dL — ABNORMAL HIGH (ref 70–99)
Sodium: 142 mEq/L (ref 135–145)

## 2011-07-05 LAB — MICROALBUMIN / CREATININE URINE RATIO
Creatinine,U: 232.5 mg/dL
Microalb Creat Ratio: 0.3 mg/g (ref 0.0–30.0)
Microalb, Ur: 0.8 mg/dL (ref 0.0–1.9)

## 2011-07-13 NOTE — Progress Notes (Signed)
Quick Note:  Left voice message ______ 

## 2012-07-01 ENCOUNTER — Observation Stay (HOSPITAL_COMMUNITY)
Admission: EM | Admit: 2012-07-01 | Discharge: 2012-07-02 | Disposition: A | Discharge status: Home / Self Care | Payer: BC Managed Care – PPO | Attending: Internal Medicine | Admitting: Internal Medicine

## 2012-07-01 ENCOUNTER — Encounter (HOSPITAL_COMMUNITY): Payer: Self-pay | Admitting: Emergency Medicine

## 2012-07-01 DIAGNOSIS — L02419 Cutaneous abscess of limb, unspecified: Principal | ICD-10-CM | POA: Insufficient documentation

## 2012-07-01 DIAGNOSIS — M1990 Unspecified osteoarthritis, unspecified site: Secondary | ICD-10-CM

## 2012-07-01 DIAGNOSIS — M109 Gout, unspecified: Secondary | ICD-10-CM

## 2012-07-01 DIAGNOSIS — R972 Elevated prostate specific antigen [PSA]: Secondary | ICD-10-CM

## 2012-07-01 DIAGNOSIS — E785 Hyperlipidemia, unspecified: Secondary | ICD-10-CM | POA: Insufficient documentation

## 2012-07-01 DIAGNOSIS — I10 Essential (primary) hypertension: Secondary | ICD-10-CM | POA: Insufficient documentation

## 2012-07-01 DIAGNOSIS — Z79899 Other long term (current) drug therapy: Secondary | ICD-10-CM | POA: Insufficient documentation

## 2012-07-01 DIAGNOSIS — E119 Type 2 diabetes mellitus without complications: Secondary | ICD-10-CM | POA: Insufficient documentation

## 2012-07-01 DIAGNOSIS — R3129 Other microscopic hematuria: Secondary | ICD-10-CM

## 2012-07-01 DIAGNOSIS — D696 Thrombocytopenia, unspecified: Secondary | ICD-10-CM | POA: Insufficient documentation

## 2012-07-01 DIAGNOSIS — D72829 Elevated white blood cell count, unspecified: Secondary | ICD-10-CM | POA: Insufficient documentation

## 2012-07-01 DIAGNOSIS — L909 Atrophic disorder of skin, unspecified: Secondary | ICD-10-CM

## 2012-07-01 DIAGNOSIS — J309 Allergic rhinitis, unspecified: Secondary | ICD-10-CM

## 2012-07-01 DIAGNOSIS — L0291 Cutaneous abscess, unspecified: Secondary | ICD-10-CM

## 2012-07-01 DIAGNOSIS — G4730 Sleep apnea, unspecified: Secondary | ICD-10-CM | POA: Insufficient documentation

## 2012-07-01 DIAGNOSIS — L0390 Cellulitis, unspecified: Secondary | ICD-10-CM | POA: Diagnosis present

## 2012-07-01 DIAGNOSIS — E876 Hypokalemia: Secondary | ICD-10-CM | POA: Insufficient documentation

## 2012-07-01 LAB — CBC WITH DIFFERENTIAL/PLATELET
Basophils Absolute: 0 10*3/uL (ref 0.0–0.1)
Eosinophils Relative: 1 % (ref 0–5)
Lymphocytes Relative: 8 % — ABNORMAL LOW (ref 12–46)
MCV: 88.2 fL (ref 78.0–100.0)
Neutro Abs: 15.2 10*3/uL — ABNORMAL HIGH (ref 1.7–7.7)
Neutrophils Relative %: 84 % — ABNORMAL HIGH (ref 43–77)
Platelets: 229 10*3/uL (ref 150–400)
RDW: 12.6 % (ref 11.5–15.5)
WBC: 18.1 10*3/uL — ABNORMAL HIGH (ref 4.0–10.5)

## 2012-07-01 LAB — COMPREHENSIVE METABOLIC PANEL
ALT: 24 U/L (ref 0–53)
AST: 22 U/L (ref 0–37)
CO2: 32 mEq/L (ref 19–32)
Calcium: 9.4 mg/dL (ref 8.4–10.5)
GFR calc non Af Amer: 77 mL/min — ABNORMAL LOW (ref >90)
Potassium: 3.2 mEq/L — ABNORMAL LOW (ref 3.5–5.1)
Sodium: 138 mEq/L (ref 135–145)

## 2012-07-01 LAB — URINALYSIS, ROUTINE W REFLEX MICROSCOPIC
Glucose, UA: NEGATIVE mg/dL
Specific Gravity, Urine: 1.022 (ref 1.005–1.030)

## 2012-07-01 LAB — GLUCOSE, CAPILLARY
Glucose-Capillary: 139 mg/dL — ABNORMAL HIGH (ref 70–99)
Glucose-Capillary: 216 mg/dL — ABNORMAL HIGH (ref 70–99)

## 2012-07-01 MED ORDER — SODIUM CHLORIDE 0.9 % IV SOLN
Freq: Once | INTRAVENOUS | Status: AC
  Administered 2012-07-01: 16:00:00 via INTRAVENOUS

## 2012-07-01 MED ORDER — ONDANSETRON HCL 4 MG/2ML IJ SOLN: 4.0000 mg | Freq: Three times a day (TID) | INTRAMUSCULAR | Status: AC | PRN

## 2012-07-01 MED ORDER — SODIUM CHLORIDE 0.9 % IV SOLN
INTRAVENOUS | Status: AC
  Administered 2012-07-01: 18:00:00 via INTRAVENOUS

## 2012-07-01 MED ORDER — POTASSIUM CHLORIDE CRYS ER 20 MEQ PO TBCR
40.0000 meq | EXTENDED_RELEASE_TABLET | Freq: Two times a day (BID) | ORAL | Status: DC
  Administered 2012-07-01: 40 meq via ORAL
  Filled 2012-07-01 (×2): qty 2

## 2012-07-01 MED ORDER — ACETAMINOPHEN 500 MG PO TABS
1000.0000 mg | ORAL_TABLET | Freq: Once | ORAL | Status: AC
  Administered 2012-07-01: 1000 mg via ORAL
  Filled 2012-07-01: qty 2

## 2012-07-01 MED ORDER — VANCOMYCIN HCL 1000 MG IV SOLR
1250.0000 mg | Freq: Two times a day (BID) | INTRAVENOUS | Status: DC
  Administered 2012-07-02: 1250 mg via INTRAVENOUS
  Filled 2012-07-01 (×3): qty 1250

## 2012-07-01 MED ORDER — INSULIN ASPART 100 UNIT/ML ~~LOC~~ SOLN: 0.0000 [IU] | Freq: Three times a day (TID) | SUBCUTANEOUS | Status: DC

## 2012-07-01 MED ORDER — HYDROCHLOROTHIAZIDE 25 MG PO TABS
25.0000 mg | ORAL_TABLET | Freq: Every day | ORAL | Status: DC
  Administered 2012-07-01 – 2012-07-02 (×2): 25 mg via ORAL
  Filled 2012-07-01 (×2): qty 1

## 2012-07-01 MED ORDER — ENOXAPARIN SODIUM 80 MG/0.8ML ~~LOC~~ SOLN
70.0000 mg | SUBCUTANEOUS | Status: DC
  Filled 2012-07-01 (×2): qty 0.8

## 2012-07-01 MED ORDER — METFORMIN HCL 500 MG PO TABS
500.0000 mg | ORAL_TABLET | Freq: Two times a day (BID) | ORAL | Status: DC
  Administered 2012-07-02: 500 mg via ORAL
  Filled 2012-07-01 (×4): qty 1

## 2012-07-01 MED ORDER — PIPERACILLIN-TAZOBACTAM 3.375 G IVPB
3.3750 g | Freq: Once | INTRAVENOUS | Status: AC
  Administered 2012-07-01: 3.375 g via INTRAVENOUS
  Filled 2012-07-01: qty 50

## 2012-07-01 MED ORDER — INSULIN ASPART 100 UNIT/ML ~~LOC~~ SOLN: 0.0000 [IU] | Freq: Every day | SUBCUTANEOUS | Status: DC

## 2012-07-01 MED ORDER — VANCOMYCIN HCL IN DEXTROSE 1-5 GM/200ML-% IV SOLN
1000.0000 mg | Freq: Once | INTRAVENOUS | Status: AC
  Administered 2012-07-01: 1000 mg via INTRAVENOUS
  Filled 2012-07-01: qty 200

## 2012-07-01 MED ORDER — ATENOLOL 50 MG PO TABS
50.0000 mg | ORAL_TABLET | Freq: Every day | ORAL | Status: DC
  Administered 2012-07-01 – 2012-07-02 (×2): 50 mg via ORAL
  Filled 2012-07-01 (×2): qty 1

## 2012-07-01 MED ORDER — ATENOLOL-CHLORTHALIDONE 50-25 MG PO TABS: 1.0000 | ORAL_TABLET | Freq: Every day | ORAL | Status: DC

## 2012-07-01 MED ORDER — PIPERACILLIN-TAZOBACTAM 3.375 G IVPB
3.3750 g | Freq: Three times a day (TID) | INTRAVENOUS | Status: DC
  Administered 2012-07-01 – 2012-07-02 (×2): 3.375 g via INTRAVENOUS
  Filled 2012-07-01 (×4): qty 50

## 2012-07-01 MED ORDER — HYDROMORPHONE HCL PF 1 MG/ML IJ SOLN: 1.0000 mg | INTRAMUSCULAR | Status: AC | PRN

## 2012-07-01 NOTE — ED Notes (Signed)
MD at bedside. 

## 2012-07-01 NOTE — ED Notes (Signed)
Cellulitis area on RLE marked with skin marker.

## 2012-07-01 NOTE — ED Notes (Signed)
ZOX:[id number]<AV> Expected date:07/01/12<BR> Expected time: 2:25 PM<BR> Means of arrival:<BR> Comments:<BR> Cellulitus

## 2012-07-01 NOTE — H&P (Signed)
[facility] History and Physical  HANS RUSHER ZOX:[id number] DOB: [birth year] DOA: 07/01/2012  Referring physician: dr Manus Gunning PCP: Nelwyn Salisbury, MD  Specialists: none  Chief Complaint: right leg erythema since 2 days.   HPI: Jermaine James is a 56 y.o. male with h/o hypertension and Diabetes came in for right leg erythema and swelling and mild tenderness since two days. Associated with fever and chills yesterday. On arrival to ED, he was found to have diffuse right leg cellulitis,with leukocytosis, and hypokalemia. He is being admitted for management of cellulitis.  Review of Systems: The patient denies anorexia,  weight loss,, vision loss, decreased hearing, hoarseness, chest pain, syncope, dyspnea on exertion, balance deficits, hemoptysis, abdominal pain, melena, hematochezia, severe indigestion/heartburn, hematuria, incontinence, genital sores, muscle weakness, suspicious skin lesions, transient blindness, difficulty walking, depression, unusual weight change, abnormal bleeding, enlarged lymph nodes, angioedema, and breast masses.    Past Medical History  Diagnosis Date  . Allergy   . Hypertension   . Diabetes mellitus     type II  . Hyperlipidemia   . Gout   . Osteoarthritis   . Microhematuria     benign, worked up with cystoscopy and CT 09/2009 per Dr. Larey Dresser  . Obesity, Class III, BMI 40-49.9 (morbid obesity)   . Sleep apnea    Past Surgical History  Procedure Date  . Traumatic loss of left eye 1961  . Left inguinal hernia repair     age 39  . Excision of lipoma left upper back 1997  . Left knee arthroscopy 1999  . Colonscopy 08-29-05    per Dr. Jarold Motto, diverticulosis, only repeat in 10 years  . Cystoscopy 09-2009    per Dr. Reymundo Poll, clear  . Eye surgery 1961    LEFT   Social History:  reports that he has never smoked. He has never used smokeless tobacco. He reports that he drinks alcohol. He reports that he does not use illicit drugs.  where does  patient live--home,  No Known Allergies  Family History  Problem Relation Age of Onset  . Alcohol abuse Other   . Coronary artery disease Other   . Hypertension Other   . Nephrolithiasis Other   . Stroke Other       Prior to Admission medications   Medication Sig Start Date End Date Taking? Authorizing Provider  acetaminophen (TYLENOL) 500 MG tablet Take 1,000 mg by mouth once.   Yes Historical Provider, MD  atenolol-chlorthalidone (TENORETIC) 50-25 MG per tablet Take 1 tablet by mouth daily. 07/04/11 07/03/13 Yes Nelwyn Salisbury, MD  ibuprofen (ADVIL,MOTRIN) 200 MG tablet Take 400 mg by mouth every 6 (six) hours as needed. pain   Yes Historical Provider, MD  metFORMIN (GLUCOPHAGE) 500 MG tablet Take 1 tablet (500 mg total) by mouth 2 (two) times daily. 07/04/11  Yes Nelwyn Salisbury, MD   Physical Exam: Filed Vitals:   07/01/12 1550 07/01/12 1647 07/01/12 1700 07/01/12 1900  BP: 111/58 112/62 105/57 134/68  Pulse: 92 91 87 106  Temp: 99.7 F (37.6 C) 99.3 F (37.4 C)  99.4 F (37.4 C)  TempSrc: Oral Oral  Oral  Resp: 18 18  20   Height:    6' (1.829 m)  Weight:    141.159 kg (311 lb 3.2 oz)  SpO2: 98% 100% 96% 99%    Constitutional: Vital signs reviewed.  Patient is a well-developed and well-nourished  in no acute distress and cooperative with exam. Alert and oriented x3.  Head: Normocephalic  and atraumatic Mouth: no erythema or exudates, MMM Eyes: PERRL, EOMI, conjunctivae normal, No scleral icterus.  Neck: Supple, Trachea midline normal ROM, No JVD, mass, thyromegaly, or carotid bruit present.  Cardiovascular: RRR, S1 normal, S2 normal, no MRG, pulses symmetric and intact bilaterally Pulmonary/Chest: CTAB, no wheezes, rales, or rhonchi Abdominal: Soft. Non-tender, non-distended, bowel sounds are normal, no masses, organomegaly, or guarding present.  GU: no CVA tenderness Musculoskeletal: right leg erythema and swelling from the ankle up to the knee. An area of induration over  the heel  Hematology: no cervical, inginal, or axillary adenopathy.  Neurological: A&O x3, Strength is normal and symmetric bilaterally, cranial nerve II-XII are grossly intact, no focal motor deficit, sensory intact to light touch bilaterally.  Skin: Warm, dry and intact. No rash, cyanosis, or clubbing.  Psychiatric: Normal mood and affect. speech and behavior is normal. Judgment and thought content normal. Cognition and memory are normal.     Labs on Admission:  Basic Metabolic Panel:  Lab 07/01/12 5784  NA 138  K 3.2*  CL 94*  CO2 32  GLUCOSE 116*  BUN 19  CREATININE 1.04  CALCIUM 9.4  MG --  PHOS --   Liver Function Tests:  Lab 07/01/12 1605  AST 22  ALT 24  ALKPHOS 61  BILITOT 0.7  PROT 8.3  ALBUMIN 3.8   No results found for this basename: LIPASE:5,AMYLASE:5 in the last 168 hours No results found for this basename: AMMONIA:5 in the last 168 hours CBC:  Lab 07/01/12 1605  WBC 18.1*  NEUTROABS 15.2*  HGB 14.5  HCT 41.3  MCV 88.2  PLT 229   Cardiac Enzymes: No results found for this basename: CKTOTAL:5,CKMB:5,CKMBINDEX:5,TROPONINI:5 in the last 168 hours  BNP (last 3 results) No results found for this basename: PROBNP:3 in the last 8760 hours CBG: No results found for this basename: GLUCAP:5 in the last 168 hours  Radiological Exams on Admission: No results found.  EKG: pending.  Assessment/Plan Active Problems:    1. Cellulitis of the right leg:  - admit to med surg - he was given vanc and zosyn in ED - Elevate the leg. - get X ray of teh right foot.   2. Hypertension: controlled. Resume home medications.   3. Diabetes Mellitus: CBG (last 3)   Basename 07/01/12 2109 07/01/12 1900  GLUCAP 216* 139*    Resume home medications. SSI.   4. Leukocytosis: probably related to cellulitis.  Pt febrile and blood cultures obtained.   5. Hypokalemia: replete as needed.   6. DVT Prophylaxis    Code Status:full code Family Communication:  wife at bedside Disposition Plan: 1 to 2 days.     [facility] Pager [phone]  If 7PM-7AM, please contact night-coverage [email or web address] Password [email or web address] 07/01/2012, 7:21 PM

## 2012-07-01 NOTE — Progress Notes (Signed)
ANTICOAGULATION CONSULT NOTE   Pharmacy Consult for Lovenox dose adjustment Indication: VTE prophylaxis  No Known Allergies  Patient Measurements: Height: 6' (182.9 cm) Weight: 311 lb 3.2 oz (141.159 kg) IBW/kg (Calculated) : 77.6    Vital Signs: Temp: 99.4 F (37.4 C) (11/17 1900) Temp src: Oral (11/17 1900) BP: 134/68 mmHg (11/17 1900) Pulse Rate: 106  (11/17 1900)  Labs:  Basename 07/01/12 1605  HGB 14.5  HCT 41.3  PLT 229  APTT --  LABPROT --  INR --  HEPARINUNFRC --  CREATININE 1.04  CKTOTAL --  CKMB --  TROPONINI --    Estimated Creatinine Clearance: 112.8 ml/min (by C-G formula based on Cr of 1.04).   Medical History: Past Medical History  Diagnosis Date  . Allergy   . Hypertension   . Diabetes mellitus     type II  . Hyperlipidemia   . Gout   . Osteoarthritis   . Microhematuria     benign, worked up with cystoscopy and CT 09/2009 per Dr. Larey Dresser  . Obesity, Class III, BMI 40-49.9 (morbid obesity)   . Sleep apnea     Medications:  Prescriptions prior to admission  Medication Sig Dispense Refill  . acetaminophen (TYLENOL) 500 MG tablet Take 1,000 mg by mouth once.      Marland Kitchen atenolol-chlorthalidone (TENORETIC) 50-25 MG per tablet Take 1 tablet by mouth daily.      Marland Kitchen ibuprofen (ADVIL,MOTRIN) 200 MG tablet Take 400 mg by mouth every 6 (six) hours as needed. pain      . metFORMIN (GLUCOPHAGE) 500 MG tablet Take 1 tablet (500 mg total) by mouth 2 (two) times daily.  60 tablet  11   Scheduled:    . [COMPLETED] sodium chloride   Intravenous Once  . sodium chloride   Intravenous STAT  . acetaminophen  1,000 mg Oral Once  . atenolol  50 mg Oral Daily   And  . hydrochlorothiazide  25 mg Oral Daily  . enoxaparin (LOVENOX) injection  70 mg Subcutaneous Q24H  . insulin aspart  0-5 Units Subcutaneous QHS  . insulin aspart  0-9 Units Subcutaneous TID WC  . metFORMIN  500 mg Oral BID WC  . piperacillin-tazobactam (ZOSYN)  IV  3.375 g Intravenous  Once  . potassium chloride  40 mEq Oral BID  . vancomycin  1,000 mg Intravenous Once  . [DISCONTINUED] atenolol-chlorthalidone  1 tablet Oral Daily    Assessment: 56 YOM - ordered ppx Lovenox, pharmacy may adjust Wt = 141kg, BMI 42 CBC ok , CrCL > 100  Goal of Therapy:  Appropriate ppx dose of Lovenox    Plan:  Lovenox 70mg  sq q24h Will s/o - reconsult if necessary  Gwen Her 07/01/2012,7:25 PM

## 2012-07-01 NOTE — ED Provider Notes (Signed)
History     CSN: [id number]  Arrival date & time 07/01/12  1439   First MD Initiated Contact with Patient 07/01/12 1543      Chief Complaint  Patient presents with  . Cellulitis    (Consider location/radiation/quality/duration/timing/severity/associated sxs/prior treatment) HPI Comments: Patient presents with 36 hour history of right lower leg pain, swelling and redness. He states he has cellulitis that she's had previously. He states he had a small abrasion to his medial malleolus last week that has resolved. He noted spreading redness morning. Fever to 101 yesterday denies any nausea or vomiting. Does not feel ill. He is a diabetic with history of hypertension and hyperlipidemia. He is not on antibiotics recently. He denies any recent car trips or travel. He states his pain is minimal. There is no pain with range of motion of his ankle and knee.   The history is provided by the patient.    Past Medical History  Diagnosis Date  . Allergy   . Hypertension   . Diabetes mellitus     type II  . Hyperlipidemia   . Gout   . Osteoarthritis   . Microhematuria     benign, worked up with cystoscopy and CT 09/2009 per Dr. Larey Dresser  . Obesity, Class III, BMI 40-49.9 (morbid obesity)   . Sleep apnea     Past Surgical History  Procedure Date  . Traumatic loss of left eye 1961  . Left inguinal hernia repair     age 56  . Excision of lipoma left upper back 1997  . Left knee arthroscopy 1999  . Colonscopy 08-29-05    per Dr. Jarold Motto, diverticulosis, only repeat in 10 years  . Cystoscopy 09-2009    per Dr. Reymundo Poll, clear  . Eye surgery 1961    LEFT    Family History  Problem Relation Age of Onset  . Alcohol abuse Other   . Coronary artery disease Other   . Hypertension Other   . Nephrolithiasis Other   . Stroke Other     History  Substance Use Topics  . Smoking status: Never Smoker   . Smokeless tobacco: Never Used  . Alcohol Use: Yes     Comment: social       Review of Systems  Constitutional: Positive for fever. Negative for activity change and appetite change.  HENT: Negative for congestion and rhinorrhea.   Respiratory: Negative for cough and shortness of breath.   Cardiovascular: Negative for chest pain.  Gastrointestinal: Negative for nausea, vomiting and abdominal pain.  Genitourinary: Negative for dysuria and hematuria.  Musculoskeletal: Negative for back pain.  Skin: Positive for rash and wound.  Neurological: Negative for dizziness, weakness and headaches.    Allergies  Review of patient's allergies indicates no known allergies.  Home Medications   Current Outpatient Rx  Name  Route  Sig  Dispense  Refill  . ACETAMINOPHEN 500 MG PO TABS   Oral   Take 1,000 mg by mouth once.         . ATENOLOL-CHLORTHALIDONE 50-25 MG PO TABS   Oral   Take 1 tablet by mouth daily.         . IBUPROFEN 200 MG PO TABS   Oral   Take 400 mg by mouth every 6 (six) hours as needed. pain         . METFORMIN HCL 500 MG PO TABS   Oral   Take 1 tablet (500 mg total) by mouth 2 (two) times daily.  60 tablet   11     BP 112/62  Pulse 91  Temp 99.3 F (37.4 C) (Oral)  Resp 18  SpO2 100%  Physical Exam  Constitutional: He is oriented to person, place, and time. He appears well-developed and well-nourished. No distress.  HENT:  Head: Normocephalic and atraumatic.  Mouth/Throat: Oropharynx is clear and moist. No oropharyngeal exudate.  Eyes: Conjunctivae normal and EOM are normal. Pupils are equal, round, and reactive to light.  Neck: Normal range of motion. Neck supple.  Cardiovascular: Normal rate, regular rhythm and normal heart sounds.   Pulmonary/Chest: Effort normal and breath sounds normal. No respiratory distress.  Abdominal: Soft. There is no tenderness. There is no rebound and no guarding.  Musculoskeletal: He exhibits edema and tenderness.       Circumferential erythema and edema to right lower extremity from knee  to ankle. Multiple overlying facial abrasions. +2 DP and PT pulse. Full range of motion of ankle and knee without pain. No palpable cords.  Neurological: He is alert and oriented to person, place, and time. No cranial nerve deficit. He exhibits normal muscle tone. Coordination normal.  Skin: Skin is warm.    ED Course  Procedures (including critical care time)  Labs Reviewed  CBC WITH DIFFERENTIAL - Abnormal; Notable for the following:    WBC 18.1 (*)     Neutrophils Relative 84 (*)     Neutro Abs 15.2 (*)     Lymphocytes Relative 8 (*)     Monocytes Absolute 1.2 (*)     All other components within normal limits  COMPREHENSIVE METABOLIC PANEL - Abnormal; Notable for the following:    Potassium 3.2 (*)     Chloride 94 (*)     Glucose, Bld 116 (*)     GFR calc non Af Amer 77 (*)     GFR calc Af Amer 90 (*)     All other components within normal limits  SEDIMENTATION RATE - Abnormal; Notable for the following:    Sed Rate 34 (*)     All other components within normal limits  CULTURE, BLOOD (ROUTINE X 2)  CULTURE, BLOOD (ROUTINE X 2)  C-REACTIVE PROTEIN   No results found.   1. Cellulitis       MDM  Erysipelas versus cellulitis of right lower extremity. Fever at home. History of diabetes. We'll dose of antibiotics, obtain labs likely need admission.  Patient has fever at home with significant leukocytosis here. Will start on broad spectrum IV antibiotics. Blood cultures obtained. Given his comorbidities I feel observation for IV antibiotics is reasonable.     Glynn Octave, MD 07/01/12 1739

## 2012-07-01 NOTE — ED Notes (Signed)
Pt has blister on back of right heel that he has been treating for week with neosporin and bandaids. Pt states that around 24hours ago he had small reddened area on right ankle that has spread up to right knee.

## 2012-07-02 DIAGNOSIS — M1990 Unspecified osteoarthritis, unspecified site: Secondary | ICD-10-CM

## 2012-07-02 LAB — CBC
MCH: 31.2 pg (ref 26.0–34.0)
Platelets: 76 10*3/uL — ABNORMAL LOW (ref 150–400)
RBC: 4.42 MIL/uL (ref 4.22–5.81)
WBC: 14.1 10*3/uL — ABNORMAL HIGH (ref 4.0–10.5)

## 2012-07-02 LAB — HEMOGLOBIN A1C: Mean Plasma Glucose: 140 mg/dL — ABNORMAL HIGH (ref <117)

## 2012-07-02 LAB — BASIC METABOLIC PANEL
CO2: 30 mEq/L (ref 19–32)
Calcium: 9.2 mg/dL (ref 8.4–10.5)
Sodium: 135 mEq/L (ref 135–145)

## 2012-07-02 LAB — GLUCOSE, CAPILLARY: Glucose-Capillary: 161 mg/dL — ABNORMAL HIGH (ref 70–99)

## 2012-07-02 MED ORDER — CLINDAMYCIN HCL 300 MG PO CAPS: 300.0000 mg | ORAL_CAPSULE | Freq: Three times a day (TID) | ORAL | Status: DC

## 2012-07-02 MED ORDER — ACETAMINOPHEN 325 MG PO TABS
650.0000 mg | ORAL_TABLET | Freq: Once | ORAL | Status: AC
  Administered 2012-07-02: 650 mg via ORAL
  Filled 2012-07-02: qty 2

## 2012-07-02 MED ORDER — POTASSIUM CHLORIDE CRYS ER 20 MEQ PO TBCR
40.0000 meq | EXTENDED_RELEASE_TABLET | Freq: Once | ORAL | Status: AC
  Administered 2012-07-02: 40 meq via ORAL
  Filled 2012-07-02: qty 2

## 2012-07-02 NOTE — Progress Notes (Signed)
ANTIBIOTIC CONSULT NOTE - INITIAL  Pharmacy Consult for Vancomycin and Zosyn  Indication: cellulitis  No Known Allergies  Patient Measurements: Height: 6' (182.9 cm) Weight: 311 lb 3.2 oz (141.159 kg) IBW/kg (Calculated) : 77.6  Adjusted Body Weight:   Vital Signs: Temp: 101 F (38.3 C) (11/17 2333) Temp src: Oral (11/17 2110) BP: 112/56 mmHg (11/17 2110) Pulse Rate: 100  (11/17 2110) Intake/Output from previous day: 11/17 0701 - 11/18 0700 In: 280 [P.O.:280] Out: -  Intake/Output from this shift: Total I/O In: 280 [P.O.:280] Out: -   Labs:  Basename 07/01/12 1605  WBC 18.1*  HGB 14.5  PLT 229  LABCREA --  CREATININE 1.04   Estimated Creatinine Clearance: 112.8 ml/min (by C-G formula based on Cr of 1.04). No results found for this basename: VANCOTROUGH:2,VANCOPEAK:2,VANCORANDOM:2,GENTTROUGH:2,GENTPEAK:2,GENTRANDOM:2,TOBRATROUGH:2,TOBRAPEAK:2,TOBRARND:2,AMIKACINPEAK:2,AMIKACINTROU:2,AMIKACIN:2, in the last 72 hours   Microbiology: No results found for this or any previous visit (from the past 720 hour(s)).  Medical History: Past Medical History  Diagnosis Date  . Allergy   . Hypertension   . Diabetes mellitus     type II  . Hyperlipidemia   . Gout   . Osteoarthritis   . Microhematuria     benign, worked up with cystoscopy and CT 09/2009 per Dr. Larey Dresser  . Obesity, Class III, BMI 40-49.9 (morbid obesity)   . Sleep apnea     Medications:  Anti-infectives     Start     Dose/Rate Route Frequency Ordered Stop   07/02/12 0000   vancomycin (VANCOCIN) 1,250 mg in sodium chloride 0.9 % 250 mL IVPB        1,250 mg 166.7 mL/hr over 90 Minutes Intravenous Every 12 hours 07/01/12 2239     07/01/12 2245   piperacillin-tazobactam (ZOSYN) IVPB 3.375 g        3.375 g 12.5 mL/hr over 240 Minutes Intravenous 3 times per day 07/01/12 2239     07/01/12 1700   vancomycin (VANCOCIN) IVPB 1000 mg/200 mL premix        1,000 mg 200 mL/hr over 60 Minutes Intravenous   Once 07/01/12 1552 07/01/12 2110   07/01/12 1700   piperacillin-tazobactam (ZOSYN) IVPB 3.375 g        3.375 g 12.5 mL/hr over 240 Minutes Intravenous  Once 07/01/12 1552 07/01/12 2021         Assessment: Patient with cellulitis.  First dose of antibiotics already given in ED.  Goal of Therapy:  Vancomycin trough level 10-15 mcg/ml Zosyn based on renal function   Plan:  Measure antibiotic drug levels at steady state Follow up culture results Vancomycin 1250mg  iv q12hr (dose given early to adjust for only 1gm given in pt with wt > 100kg.  Zosyn 3.375g IV Q8H infused over 4hrs.  Darlina Guys, Jacquenette Shone Crowford 07/02/2012,1:14 AM

## 2012-07-02 NOTE — Discharge Summary (Signed)
Physician Discharge Summary  Jermaine James AVW:[id number] DOB: [birth year] DOA: 07/01/2012  PCP: Nelwyn Salisbury, MD  Admit date: 07/01/2012 Discharge date: 07/02/2012  Recommendations for Outpatient Follow-up:  1. Follow up with PCP in next 4 days to evaluate resolution of cellulitis  Discharge Diagnoses:  Principal Problem:  *Cellulitis Active Problems:  DM w/o Complication Type II  HYPERLIPIDEMIA  HYPERTENSION  Discharge Condition: medically stable for discharge home today with clindamycin for 7 days  Diet recommendation: carb modified  History of present illness:  56 year old male with history of diabetes and HTN presented with right lower extremity redness, tenderness. Patient was found to have cellulitis. He was initially treated with vanco and zosyn. I spoke with ID on call and recommendation was to discharge patient home (as the patient has requested) with clindamycin for next 7 days.  Hospital Course:  Principal Problem:  *Cellulitis, right LE - we will prescribe clindamycin for 7 days - patient reports no pain in right LE  Active Problems:  DM w/o Complication Type II - continue metformin - we have drawn A1c prior to discharge but the patient will follow up with PCP in regards to the results    HYPERTENSION - continue home meds  Thrombocytopenia - perhaps due to vanco - trended up to ~ 90 - No signs of active bleed - I informed the patient about this finding and told him to follow up with PCP to recheck CBC to make sure platelet count trends further up  Procedures:  None   Consultations:  ID (Dr. Orvan Falconer) - phone call only  Discharge Exam: Filed Vitals:   07/02/12 1351  BP: 111/71  Pulse: 72  Temp: 97.5 F (36.4 C)  Resp: 18   Filed Vitals:   07/02/12 0148 07/02/12 0515 07/02/12 0801 07/02/12 1351  BP:  97/55 117/64 111/71  Pulse:  80 72 72  Temp: 99.4 F (37.4 C) 100.2 F (37.9 C) 98.7 F (37.1 C) 97.5 F (36.4 C)  TempSrc: Oral Oral  Oral Oral  Resp:  18 18 18   Height:      Weight:      SpO2:  96% 98% 97%    General: Pt is alert, follows commands appropriately, not in acute distress Cardiovascular: Regular rate and rhythm, S1/S2 +, no murmurs, no rubs, no gallops Respiratory: Clear to auscultation bilaterally, no wheezing, no crackles, no rhonchi Abdominal: Soft, non tender, non distended, bowel sounds +, no guarding Extremities: right lower extremity cellulitis, improving, pulses palpable bilaterally DP and PT Neuro: Grossly nonfocal  Discharge Instructions  Discharge Orders    Future Appointments: Provider: Department: Dept Phone: Center:   07/03/2012 4:00 PM Barbaraann Share, MD [facility] [phone] None     Future Orders Please Complete By Expires   Ambulatory referral to Nutrition and Diabetic Education      Diet - low sodium heart healthy      Increase activity slowly      Discharge instructions      Comments:   Please follow up with PCP in about 4 days to evaluate the resolution of cellulitis Please repeat CBC to see the trend for platelet count.   Call MD for:  persistant nausea and vomiting      Call MD for:  severe uncontrolled pain      Call MD for:  difficulty breathing, headache or visual disturbances      Call MD for:  persistant dizziness or light-headedness  Medication List     As of 07/02/2012  1:56 PM    TAKE these medications         acetaminophen 500 MG tablet   Commonly known as: TYLENOL   Take 1,000 mg by mouth once.      atenolol-chlorthalidone 50-25 MG per tablet   Commonly known as: TENORETIC   Take 1 tablet by mouth daily.      clindamycin 300 MG capsule   Commonly known as: CLEOCIN   Take 1 capsule (300 mg total) by mouth 3 (three) times daily.      ibuprofen 200 MG tablet   Commonly known as: ADVIL,MOTRIN   Take 400 mg by mouth every 6 (six) hours as needed. pain      metFORMIN 500 MG tablet   Commonly known as: GLUCOPHAGE   Take 1 tablet  (500 mg total) by mouth 2 (two) times daily.           Follow-up Information    Follow up with Nelwyn Salisbury, MD. Schedule an appointment as soon as possible for a visit on 07/06/2012.   Contact information:   [address] [place] [address] [phone]           The results of significant diagnostics from this hospitalization (including imaging, microbiology, ancillary and laboratory) are listed below for reference.    Significant Diagnostic Studies: No results found.  Microbiology: Recent Results (from the past 240 hour(s))  CULTURE, BLOOD (ROUTINE X 2)     Status: Normal (Preliminary result)   Collection Time   07/01/12  4:05 PM      Component Value Range Status Comment   Specimen Description BLOOD LEFT ARM   Final    Special Requests BOTTLES DRAWN AEROBIC AND ANAEROBIC 5CC EACH   Final    Culture  Setup Time 07/01/2012 23:40   Final    Culture     Final    Value:        BLOOD CULTURE RECEIVED NO GROWTH TO DATE CULTURE WILL BE HELD FOR 5 DAYS BEFORE ISSUING A FINAL NEGATIVE REPORT   Report Status PENDING   Incomplete   CULTURE, BLOOD (ROUTINE X 2)     Status: Normal (Preliminary result)   Collection Time   07/01/12  4:40 PM      Component Value Range Status Comment   Specimen Description BLOOD RIGHT ARM   Final    Special Requests BOTTLES DRAWN AEROBIC AND ANAEROBIC 4CC EACH   Final    Culture  Setup Time 07/01/2012 23:40   Final    Culture     Final    Value:        BLOOD CULTURE RECEIVED NO GROWTH TO DATE CULTURE WILL BE HELD FOR 5 DAYS BEFORE ISSUING A FINAL NEGATIVE REPORT   Report Status PENDING   Incomplete      Labs: Basic Metabolic Panel:  Lab 07/02/12 9147 07/01/12 1605  NA 135 138  K 3.4* 3.2*  CL 95* 94*  CO2 30 32  GLUCOSE 151* 116*  BUN 14 19  CREATININE 1.11 1.04  CALCIUM 9.2 9.4   Liver Function Tests:  Lab 07/01/12 1605  AST 22  ALT 24  ALKPHOS 61  BILITOT 0.7  PROT 8.3  ALBUMIN 3.8   No results found for this  basename: LIPASE:5,AMYLASE:5 in the last 168 hours No results found for this basename: AMMONIA:5 in the last 168 hours CBC:  Lab 07/02/12 1020 07/02/12 0413 07/01/12 1605  WBC -- 14.1* 18.1*  NEUTROABS -- -- 15.2*  HGB -- 13.8 14.5  HCT -- 39.4 41.3  MCV -- 89.1 88.2  PLT 91* 76* 229   CBG:  Lab 07/02/12 1154 07/02/12 0753 07/01/12 2109 07/01/12 1900  GLUCAP 164* 161* 216* 139*    Time coordinating discharge: Over 30 minutes  Signed:  Manson Passey, MD  [facility] 07/02/2012, 1:56 PM  Pager #: [phone]

## 2012-07-02 NOTE — Progress Notes (Signed)
Inpatient Diabetes Program Recommendations  AACE/ADA: New Consensus Statement on Inpatient Glycemic Control (2013)  Target Ranges:  Prepandial:   less than 140 mg/dL      Peak postprandial:   less than 180 mg/dL (1-2 hours)      Critically ill patients:  140 - 180 mg/dL   Reason for Visit: Hyperglycemia  Results for KARLO, GOEDEN (MRN [id number]) as of 07/02/2012 10:51  Ref. Range 07/01/2012 19:00 07/01/2012 21:09 07/02/2012 07:53  Glucose-Capillary Latest Range: 70-99 mg/dL 409 (H) 811 (H) 914 (H)    Inpatient Diabetes Program Recommendations HgbA1C: Needs updated HgbA1C - last one was 6.6% on 07/05/11  Note: Will follow.

## 2012-07-02 NOTE — Progress Notes (Signed)
Patient discharged per MD order. DC instructions reviewed with patient and wife at bedside. One prescription for an antibiotic given to patient. Pt refused wheelchair and said he would walk to the car. Patient's wife assisted patient upon exiting the hospital. Angelena Form, RN

## 2012-07-02 NOTE — Progress Notes (Signed)
Inpatient Diabetes Program Recommendations  AACE/ADA: New Consensus Statement on Inpatient Glycemic Control (2013)  Target Ranges:  Prepandial:   less than 140 mg/dL      Peak postprandial:   less than 180 mg/dL (1-2 hours)      Critically ill patients:  140 - 180 mg/dL   Reason for Visit: Hyperglycemia  Long discussion with pt regarding glycemic control.  Stressed importance of controlling blood sugars at home.  Pt states he's gained weight recently and verbalizes need to make lifestyle changes to control diabetes.  Willing to attend outpatient diabetes education classes and will f/u with Dr. Sharl Ma.  Will get HgbA1C today and results will be given at MD appt.  Recommended pt get meter and check blood sugars and take info back to endo for adjustments in medication.  Pt has been refusing insulin, states no one explained to him why he should take it.  Assured him that a correction insulin was ordered and he wouldn't receive any insulin if blood sugars were within normal range.  Pt stated he understands.  Gave copy of Living Well With Diabetes packet and encouraged pt to view diabetes videos on pt ed channel.  Stressed how weight loss, exercise, and CHO mod diet will help pt use his own insulin better. Will order OP Diabetes Education consult.    Discussed with RN and MD.  Inpatient Diabetes Program Recommendations HgbA1C: Needs updated HgbA1C - last one was 6.6% on 07/05/11

## 2012-07-02 NOTE — Progress Notes (Signed)
On-call provider notified Temp 100.2, HR - 80, RR 18, B/P 97/55 and patient complained of headache @ 0556. New order was placed for Tylenol 650mg   And was given to patient.

## 2012-07-03 ENCOUNTER — Encounter: Payer: Self-pay | Admitting: Pulmonary Disease

## 2012-07-03 ENCOUNTER — Ambulatory Visit (INDEPENDENT_AMBULATORY_CARE_PROVIDER_SITE_OTHER): Payer: BC Managed Care – PPO | Admitting: Pulmonary Disease

## 2012-07-03 VITALS — BP 124/76 | HR 72 | Temp 97.8°F | Ht 72.0 in | Wt 312.0 lb

## 2012-07-03 DIAGNOSIS — G4733 Obstructive sleep apnea (adult) (pediatric): Secondary | ICD-10-CM

## 2012-07-03 NOTE — Progress Notes (Signed)
  Subjective:    Patient ID: Jermaine James, male    DOB: [birth year], 56 y.o.   MRN: [id number]  HPI The patient comes in today for followup of his obstructive sleep apnea.  He was diagnosed many years ago with very severe OSA, with an AHI greater than 100 events per hour.  He was started on CPAP and responded very very well, but he has not followed up in many years.  He comes in today where he is overdue for supplies, and his CPAP machine is also giving him trouble.  The on/off switch is not functioning properly.  The patient has not had any breakthrough snoring on the CPAP, and feels that he sleeps well with excellent alertness during the day.  He denies any sleepiness with watching television, movies, or driving.  His weight is actually fairly stable from his last visit with [place].  His Epworth score today is only one.  Sleep Questionnaire: What time do you typically go to bed?( Between what hours) 10pm and midnight How long does it take you to fall asleep? less than 15 min How many times during the night do you wake up? 0 What time do you get out of bed to start your day? 0715 Do you drive or operate heavy machinery in your occupation? No How much has your weight changed (up or down) over the past two years? (In pounds) 10 lb (4.536 kg) Have you ever had a sleep study before? Yes If yes, location of study? [facility] If yes, date of study? 10/2005 Do you currently use CPAP? Yes If so, what pressure? 14 Do you wear oxygen at any time? No    Review of Systems  Constitutional: Negative for fever and unexpected weight change. Fatigue:  left eye   HENT: Positive for congestion and postnasal drip. Negative for ear pain, nosebleeds, sore throat, rhinorrhea, sneezing, trouble swallowing, dental problem and sinus pressure.   Eyes: Positive for redness. Negative for itching.  Respiratory: Positive for cough (am only ). Negative for chest tightness, shortness of breath and wheezing.   Cardiovascular:  Positive for leg swelling. Negative for palpitations.  Gastrointestinal: Negative for nausea and vomiting.  Genitourinary: Negative for dysuria.  Musculoskeletal: Negative for joint swelling.  Skin: Negative for rash.  Neurological: Negative for headaches.  Hematological: Does not bruise/bleed easily.  Psychiatric/Behavioral: Negative for dysphoric mood. The patient is not nervous/anxious.        Objective:   Physical Exam Obese male in no acute distress No skin breakdown or pressure necrosis from the CPAP mask Nose without purulence or discharge noted Neck without JVD or lymphadenopathy Chest clear to auscultation Cardiac exam is regular rate and rhythm Lower extremities with no significant edema, no cyanosis Alert and oriented, moves all 4 extremities.       Assessment & Plan:

## 2012-07-03 NOTE — Patient Instructions (Addendum)
Will send an order to your dme for new supplies and to consider a new cpap device. Work on weight loss followup with me in one year.

## 2012-07-03 NOTE — Assessment & Plan Note (Signed)
The patient has a history of very severe obstructive sleep apnea, but has done quite well with CPAP.  He feels that he is sleeping well, has excellent daytime alertness, and has not had any breakthrough snoring while wearing the device.  He is overdue for supplies at this time, and he is also having issues with his CPAP machine.  The device is 58 years old, and I think needs to be replaced.

## 2012-07-04 ENCOUNTER — Ambulatory Visit (INDEPENDENT_AMBULATORY_CARE_PROVIDER_SITE_OTHER): Payer: BC Managed Care – PPO | Admitting: Family Medicine

## 2012-07-04 ENCOUNTER — Encounter: Payer: Self-pay | Admitting: Family Medicine

## 2012-07-04 VITALS — BP 138/74 | HR 87 | Temp 98.5°F | Wt 311.0 lb

## 2012-07-04 DIAGNOSIS — L0390 Cellulitis, unspecified: Secondary | ICD-10-CM

## 2012-07-04 DIAGNOSIS — L0291 Cutaneous abscess, unspecified: Secondary | ICD-10-CM

## 2012-07-04 MED ORDER — METFORMIN HCL 500 MG PO TABS: 500.0000 mg | ORAL_TABLET | Freq: Two times a day (BID) | ORAL | Status: DC

## 2012-07-04 MED ORDER — ATENOLOL-CHLORTHALIDONE 50-25 MG PO TABS: 1.0000 | ORAL_TABLET | Freq: Every day | ORAL | Status: DC

## 2012-07-04 NOTE — Progress Notes (Signed)
  Subjective:    Patient ID: Jermaine James, male    DOB: [birth year], 56 y.o.   MRN: [id number]  HPI Here to follow up a hospital stay from 07-01-12 to 11-18 for cellulitis on the right lower leg. This started about 2 weeks ago after he had a mild abrasion jut above the foot. The redness and pain spread so he went to the ER. His fever was as high as 101 degrees but this resolved quickly with IV Vancomycin and Zosyn. His WBC jumped to 18 on admission but had decreased to 14 at discharge. His admission platelet count was 229 but this dropped to 76 by DC. This was felt to be a reaction to the antibiotics. His glucoses went up to the 200s at this time but his admission A1c was excellent at 6.5. Blood cultures were negative. He was sent home on 7 days Clindamycin. He feels better now with no fever and no pain in the leg at all. Some erythema remains. He is back to work.    Review of Systems  Constitutional: Negative.   Skin: Positive for color change.       Objective:   Physical Exam  Constitutional: He appears well-developed and well-nourished.  Skin:       Slight erythema over the lower right leg with slight edema. Not warm or tender           Assessment & Plan:  The cellulitis seems to be resolving nicely. He will finish out the Clindamycin. We will get a BMET and a CBC today to follow the glucose and the cell counts.

## 2012-07-05 LAB — CBC WITH DIFFERENTIAL/PLATELET
Basophils Absolute: 0.1 10*3/uL (ref 0.0–0.1)
Eosinophils Relative: 4.3 % (ref 0.0–5.0)
Lymphocytes Relative: 22.5 % (ref 12.0–46.0)
Lymphs Abs: 2.5 10*3/uL (ref 0.7–4.0)
Monocytes Relative: 7.4 % (ref 3.0–12.0)
Neutrophils Relative %: 64.9 % (ref 43.0–77.0)
Platelets: 209 10*3/uL (ref 150.0–400.0)
RDW: 12.9 % (ref 11.5–14.6)
WBC: 11 10*3/uL — ABNORMAL HIGH (ref 4.5–10.5)

## 2012-07-05 LAB — BASIC METABOLIC PANEL
BUN: 18 mg/dL (ref 6–23)
Calcium: 9.6 mg/dL (ref 8.4–10.5)
Creatinine, Ser: 1 mg/dL (ref 0.4–1.5)
GFR: 81.53 mL/min (ref >60.00)
Glucose, Bld: 125 mg/dL — ABNORMAL HIGH (ref 70–99)

## 2012-07-07 LAB — CULTURE, BLOOD (ROUTINE X 2): Culture: NO GROWTH

## 2012-07-09 NOTE — Progress Notes (Signed)
Quick Note:  I left voice message with results. ______ 

## 2012-08-20 ENCOUNTER — Other Ambulatory Visit (INDEPENDENT_AMBULATORY_CARE_PROVIDER_SITE_OTHER): Payer: BC Managed Care – PPO

## 2012-08-20 DIAGNOSIS — Z0000 Encounter for general adult medical examination without abnormal findings: Secondary | ICD-10-CM

## 2012-08-20 LAB — POCT URINALYSIS DIPSTICK: pH, UA: 6.5

## 2012-08-20 LAB — HEPATIC FUNCTION PANEL
AST: 23 U/L (ref 0–37)
Albumin: 3.9 g/dL (ref 3.5–5.2)
Alkaline Phosphatase: 46 U/L (ref 39–117)
Total Protein: 7.7 g/dL (ref 6.0–8.3)

## 2012-08-20 LAB — BASIC METABOLIC PANEL
CO2: 30 mEq/L (ref 19–32)
Calcium: 9 mg/dL (ref 8.4–10.5)
Chloride: 98 mEq/L (ref 96–112)
Potassium: 3.7 mEq/L (ref 3.5–5.1)
Sodium: 137 mEq/L (ref 135–145)

## 2012-08-20 LAB — CBC WITH DIFFERENTIAL/PLATELET
Basophils Relative: 1 % (ref 0.0–3.0)
Eosinophils Absolute: 0.5 10*3/uL (ref 0.0–0.7)
HCT: 41.7 % (ref 39.0–52.0)
Hemoglobin: 14 g/dL (ref 13.0–17.0)
Lymphocytes Relative: 29.3 % (ref 12.0–46.0)
Lymphs Abs: 2.2 10*3/uL (ref 0.7–4.0)
MCHC: 33.7 g/dL (ref 30.0–36.0)
Monocytes Relative: 7.7 % (ref 3.0–12.0)
Neutro Abs: 4.2 10*3/uL (ref 1.4–7.7)
RBC: 4.62 Mil/uL (ref 4.22–5.81)

## 2012-08-20 LAB — LIPID PANEL
Cholesterol: 164 mg/dL (ref 0–200)
HDL: 34.3 mg/dL — ABNORMAL LOW (ref >39.00)
Triglycerides: 211 mg/dL — ABNORMAL HIGH (ref 0.0–149.0)
VLDL: 42.2 mg/dL — ABNORMAL HIGH (ref 0.0–40.0)

## 2012-08-20 LAB — MICROALBUMIN / CREATININE URINE RATIO: Microalb Creat Ratio: 0.7 mg/g (ref 0.0–30.0)

## 2012-08-22 NOTE — Progress Notes (Signed)
Quick Note:  I spoke with pt ______ 

## 2012-08-27 ENCOUNTER — Ambulatory Visit (INDEPENDENT_AMBULATORY_CARE_PROVIDER_SITE_OTHER): Payer: BC Managed Care – PPO | Admitting: Family Medicine

## 2012-08-27 ENCOUNTER — Encounter: Payer: Self-pay | Admitting: Family Medicine

## 2012-08-27 VITALS — BP 120/80 | HR 91 | Temp 98.3°F | Ht 71.5 in | Wt 317.0 lb

## 2012-08-27 DIAGNOSIS — Z0000 Encounter for general adult medical examination without abnormal findings: Secondary | ICD-10-CM

## 2012-08-27 MED ORDER — LISINOPRIL-HYDROCHLOROTHIAZIDE 10-12.5 MG PO TABS: 1.0000 | ORAL_TABLET | Freq: Every day | ORAL | Status: DC

## 2012-08-28 ENCOUNTER — Encounter: Payer: Self-pay | Admitting: Family Medicine

## 2012-08-28 NOTE — Progress Notes (Signed)
  Subjective:    Patient ID: Jermaine James, male    DOB: 1956/02/21, 57 y.o.   MRN: [id number]  HPI 57 yr old male for a cpx. He feels fine but admits to not exercising at all over the holidays and overeating. He has gained about 7 lbs. Consequently his A1c is up to 7.0. He is positive now for urine microalbumen.    Review of Systems  Constitutional: Negative.   HENT: Negative.   Eyes: Negative.   Respiratory: Negative.   Cardiovascular: Negative.   Gastrointestinal: Negative.   Genitourinary: Negative.   Musculoskeletal: Negative.   Skin: Negative.   Neurological: Negative.   Hematological: Negative.   Psychiatric/Behavioral: Negative.        Objective:   Physical Exam  Constitutional: He is oriented to person, place, and time. He appears well-developed and well-nourished. No distress.  HENT:  Head: Normocephalic and atraumatic.  Right Ear: External ear normal.  Left Ear: External ear normal.  Nose: Nose normal.  Mouth/Throat: Oropharynx is clear and moist. No oropharyngeal exudate.  Eyes: Conjunctivae normal and EOM are normal. Pupils are equal, round, and reactive to light. Right eye exhibits no discharge. Left eye exhibits no discharge. No scleral icterus.  Neck: Neck supple. No JVD present. No tracheal deviation present. No thyromegaly present.  Cardiovascular: Normal rate, regular rhythm, normal heart sounds and intact distal pulses.  Exam reveals no gallop and no friction rub.   No murmur heard.      EKG normal   Pulmonary/Chest: Effort normal and breath sounds normal. No respiratory distress. He has no wheezes. He has no rales. He exhibits no tenderness.  Abdominal: Soft. Bowel sounds are normal. He exhibits no distension and no mass. There is no tenderness. There is no rebound and no guarding.  Genitourinary: Rectum normal, prostate normal and penis normal. Guaiac negative stool. No penile tenderness.  Musculoskeletal: Normal range of motion. He exhibits no edema and  no tenderness.  Lymphadenopathy:    He has no cervical adenopathy.  Neurological: He is alert and oriented to person, place, and time. He has normal reflexes. No cranial nerve deficit. He exhibits normal muscle tone. Coordination normal.  Skin: Skin is warm and dry. No rash noted. He is not diaphoretic. No erythema. No pallor.  Psychiatric: He has a normal mood and affect. His behavior is normal. Judgment and thought content normal.          Assessment & Plan:  Well exam. We discussed diet and exercise. Switch from Tenoretic to Lisinopril HCT so as to get him on an ACE inhibitor. Recheck one month.

## 2012-08-31 ENCOUNTER — Telehealth: Payer: Self-pay | Admitting: Family Medicine

## 2012-08-31 MED ORDER — CIPROFLOXACIN HCL 500 MG PO TABS: 500.0000 mg | ORAL_TABLET | Freq: Two times a day (BID) | ORAL | Status: DC

## 2012-08-31 NOTE — Telephone Encounter (Signed)
done

## 2012-08-31 NOTE — Telephone Encounter (Signed)
Pt needs the antibiotic sent in to [facility] ( Cipro )

## 2012-09-19 ENCOUNTER — Telehealth: Payer: Self-pay | Admitting: Family Medicine

## 2012-09-19 DIAGNOSIS — N50819 Testicular pain, unspecified: Secondary | ICD-10-CM

## 2012-09-19 NOTE — Telephone Encounter (Signed)
I did a referral to Urology

## 2012-09-19 NOTE — Telephone Encounter (Signed)
Pt reports being seen in the office on 08/27/12 for a physical.  Pt states during the physical he told Dr Clent Ridges about some trouble in his groin.  Dr Clent Ridges diagnosed it as 'epididymitis' and put the patient on Cipro.  Pt has completed the medication as of 09/18/12.  Pt states that there has not been any change.  Pt wants to know if Dr Clent Ridges wants to try another medication or if he needs a referral to a urologist.  OFFICE PLEASE FOLLOW UP WITH PATIENT.

## 2012-09-20 NOTE — Telephone Encounter (Signed)
I spoke with pt  

## 2012-11-02 ENCOUNTER — Telehealth: Payer: Self-pay | Admitting: Pulmonary Disease

## 2012-11-02 NOTE — Telephone Encounter (Signed)
Called, spoke with [facility].  States they faxed over a release of info to have pt's sleep study faxed to them for anesthesiologist.  Per Harvin Hazel, pt doesn't need clearance from [facility].  They just need sleep study from medical records.  I have given her medical records # to check on the status of this.  Tresa Endo verbalized understanding of this and voiced no further questions or concerns at this time.

## 2012-11-05 ENCOUNTER — Encounter (HOSPITAL_BASED_OUTPATIENT_CLINIC_OR_DEPARTMENT_OTHER): Payer: Self-pay | Admitting: *Deleted

## 2012-11-05 NOTE — Progress Notes (Signed)
Wife is ER nurse Tenna Child is a compliant cpap pt-no cardiac or resp problems To bring cpap.all meds and overnight bag in case he needs to stay

## 2012-11-06 ENCOUNTER — Encounter (HOSPITAL_BASED_OUTPATIENT_CLINIC_OR_DEPARTMENT_OTHER)
Admission: RE | Admit: 2012-11-06 | Discharge: 2012-11-06 | Disposition: A | Discharge status: Home / Self Care | Payer: BC Managed Care – PPO | Source: Ambulatory Visit | Attending: Orthopedic Surgery | Admitting: Orthopedic Surgery

## 2012-11-06 LAB — BASIC METABOLIC PANEL
BUN: 15 mg/dL (ref 6–23)
Calcium: 9.7 mg/dL (ref 8.4–10.5)
Creatinine, Ser: 0.79 mg/dL (ref 0.50–1.35)
GFR calc non Af Amer: >90 mL/min (ref >90)
Glucose, Bld: 146 mg/dL — ABNORMAL HIGH (ref 70–99)

## 2012-11-07 NOTE — H&P (Signed)
Jermaine James/[facility] [address]   [address] [place],  [address] [phone] A Division of [facility]  Jermaine James, M.D.   Jermaine James, M.D.   Jermaine James, M.D.   Jermaine James, M.D.   Jermaine James, M.D Jermaine James, M.D.  Jermaine James, M.D.   Jermaine James, M.D.   Melina Fiddler, M.D. Jermaine Churn. Barry Dienes, PA-C            Jermaine A. Shepperson, PA-C Jermaine [place], PA-C [place], [place]   RE: Jermaine James, Jermaine James                                [id number]      DOB: [birth year] INITIAL EVALUATION:  10-23-12 SUBJECTIVE: Jermaine James is a 58 year-old gentleman, new patient to me who comes to the office with complaints of bilateral knee pain.  He complains of stiffness with the left knee.  He has been told that he has osteoarthritis.  Underwent a left knee arthroscopy approximately 10-12 years ago.  At that time he underwent an arthroscopy for a meniscus tear and was told he had arthritis in either side of his knee and that ultimately it would come down to a knee replacement.  Not a lot of pain with the left knee, more stiffness and morning discomfort.  No night pain.  No mechanical symptoms.  His right knee has been troublesome over the past several weeks.  Pain, catching and popping to the inner side of the knee.  He was going up the stairs about three weeks ago and he felt a pop to the inner side of the knee and he has had pain ever since.  Intermittent swelling.  Pain with stooping and squatting.  A sense of catching in the knee.  No previous significant problems with his right knee.  His health upon review shows that he has no allergies.  He has a history of diabetes and a history of hypertension.  No history of hyperlipidemia.  He is on Metformin, as well as Lisinopril.  Past surgical history is significant for left knee arthroscopy.  Family history is significant for hypertension.  He is a nonsmoker, married and  employed in Photographer at [employer].  OBJECTIVE: Height: 6?0.  Weight: 315 pounds.  Blood pressure: 140/86.  He sits comfortably in the exam room.  Exam of the left knee shows full extension, flexion is to 100 degrees.  Boggy synovitis.  No effusion.  Tenderness over the medial joint line.  2+ retropatellar grind.  Stable knee.  He is neurovascularly intact distally.  Exam of the right knee shows discreet tenderness over the medial joint line.  Trace retropatellar grind.  Positive medial McMurray's.  Full motion.  No effusion.  He is neurovascularly intact distally there as well.      X-RAYS: Bilateral four views of each knee show on the left he is bone on bone in the medial compartment with significant retropatellar spurring and diminished patellofemoral joint space height.  Plain films of the right knee show minimal degenerative changes.  Subtle retropatellar spurring.  Well maintained medial joint space height.    ASSESSMENT: 1. End stage left knee DJD. 2. Right knee medial meniscus tear.    PLAN: Discussed Corticosteroid injection and/or Visco supplementation for his symptomatic left knee, as well as the merits of total knee replacement, which he is not ready  for.  For his symptomatic right knee I am going to obtain an MRI.  Follow up after imaging studies.  Obtain surgical consultation in anticipation of right knee arthroscopy at that time.  He does not want to proceed with proactive therapeutic intervention for his left knee at this James in time, other than continued weight loss and therapeutic exercise, which we discussed at length.    Jermaine James, M.D.   Electronically verified by Jermaine James, M.D. JSK:jjh D 10-23-12 T 10-24-12  Jermaine James/[facility] [address]   [address] [place], [place] [address] [phone] A Division of [facility]  Jermaine James, M.D.   Izsak A. Thurston James, M.D.   Jermaine James, M.D.   Jermaine James, M.D.   Jermaine James, M.D Jermaine James, M.D.  Jermaine James, M.D.   Jermaine James, M.D.   Melina Fiddler, M.D. Jermaine Churn. Barry Dienes, PA-C            Jermaine A. Shepperson, PA-C Jermaine [place], PA-C Jermaine James, [place]   RE: Jermaine James, Jermaine James                                [id number]      DOB: 1955/09/25 PROGRESS NOTE: 10-30-12 Jermaine James is seen for evaluation and treatment recommendation, referred by Dr. Farris Has for consultation.  This is for his right knee.  He is getting more and more miserable.  He has progressed to a James where he is having to use a cane to walk.  This is a 57 year-old male who has known end stage degenerative arthritis in his left knee.  Despite that being bone on bone he really has put up with it well.  His complaint is a little soreness and stiffness.  He is therefore relatively tough in regards to pain control.  The right knee however has become increasingly more intolerable.  Marked sharp pain and mechanical symptoms medially.  Getting worse rather than better.  Previous x-rays have shown end stage changes in his left knee, essentially bone on bone.  On the right he has well maintained joint space. There is really only a paucity of degenerative changes.  Given the persistent symptoms he underwent an MRI scan on October 25, 2012.  I have looked at that report, as well as the scan itself.  This shows a radial tear posterior horn medial meniscus.  A little thickening of the MCL which I think is probably old.  Some moderate thinning tricompartmentally, but does not look too extreme.  His history, signs and symptoms are all very consistent with an increasingly symptomatic medial meniscus tear.  He is 57 years old.  Underlying diabetes, hypertension and obesity.  The right knee has gotten to a James that he cannot do anything in regards to exercise to help with weight control and his diabetes.  He would like to pursue definitive treatment.  I spent more than 25 minutes with him  discussing options and reviewing his x-rays and scan.     EXAMINATION: On the left he has varus thrust, tibiofemoral crepitus, but maintaining reasonable motion.  Not a lot of pain.  Not a lot of swelling.  On the right exquisitely tender medial joint line.  Positive medial McMurray's.  Good motion.  Stable ligaments.  No pain with stress of the MCL.  Neurovascularly intact distally.    DISPOSITION:  I think the  best approach is to do an arthroscopy and address his medial meniscus tear on the right.  He wholeheartedly agrees and would like to proceed.  Procedure, risks, benefits and complications reviewed.  Paperwork complete.  What to anticipate intraoperative and James-op reviewed.  We are going to set this up in the near future.    Jermaine James, M.D.   Electronically verified by Jermaine James, M.D. DFM:jjh D 10-31-12 T 11-01-12

## 2012-11-08 ENCOUNTER — Ambulatory Visit (HOSPITAL_BASED_OUTPATIENT_CLINIC_OR_DEPARTMENT_OTHER)
Admission: RE | Admit: 2012-11-08 | Discharge: 2012-11-08 | Disposition: A | Discharge status: Home / Self Care | Payer: BC Managed Care – PPO | Source: Ambulatory Visit | Attending: Orthopedic Surgery | Admitting: Orthopedic Surgery

## 2012-11-08 ENCOUNTER — Encounter (HOSPITAL_BASED_OUTPATIENT_CLINIC_OR_DEPARTMENT_OTHER): Payer: Self-pay | Admitting: Certified Registered Nurse Anesthetist

## 2012-11-08 ENCOUNTER — Ambulatory Visit (HOSPITAL_BASED_OUTPATIENT_CLINIC_OR_DEPARTMENT_OTHER): Payer: BC Managed Care – PPO | Admitting: Certified Registered Nurse Anesthetist

## 2012-11-08 ENCOUNTER — Encounter (HOSPITAL_BASED_OUTPATIENT_CLINIC_OR_DEPARTMENT_OTHER)
Admission: RE | Disposition: A | Discharge status: Home / Self Care | Payer: Self-pay | Source: Ambulatory Visit | Attending: Orthopedic Surgery

## 2012-11-08 DIAGNOSIS — X500XXA Overexertion from strenuous movement or load, initial encounter: Secondary | ICD-10-CM | POA: Insufficient documentation

## 2012-11-08 DIAGNOSIS — S83289A Other tear of lateral meniscus, current injury, unspecified knee, initial encounter: Secondary | ICD-10-CM | POA: Insufficient documentation

## 2012-11-08 DIAGNOSIS — I10 Essential (primary) hypertension: Secondary | ICD-10-CM | POA: Insufficient documentation

## 2012-11-08 DIAGNOSIS — M1710 Unilateral primary osteoarthritis, unspecified knee: Secondary | ICD-10-CM | POA: Insufficient documentation

## 2012-11-08 DIAGNOSIS — IMO0002 Reserved for concepts with insufficient information to code with codable children: Secondary | ICD-10-CM | POA: Insufficient documentation

## 2012-11-08 DIAGNOSIS — E119 Type 2 diabetes mellitus without complications: Secondary | ICD-10-CM | POA: Insufficient documentation

## 2012-11-08 DIAGNOSIS — Z6841 Body Mass Index (BMI) 40.0 and over, adult: Secondary | ICD-10-CM | POA: Insufficient documentation

## 2012-11-08 DIAGNOSIS — Z9889 Other specified postprocedural states: Secondary | ICD-10-CM

## 2012-11-08 DIAGNOSIS — G4730 Sleep apnea, unspecified: Secondary | ICD-10-CM | POA: Insufficient documentation

## 2012-11-08 DIAGNOSIS — Z79899 Other long term (current) drug therapy: Secondary | ICD-10-CM | POA: Insufficient documentation

## 2012-11-08 HISTORY — PX: KNEE ARTHROSCOPY WITH MEDIAL MENISECTOMY: SHX5651

## 2012-11-08 LAB — GLUCOSE, CAPILLARY: Glucose-Capillary: 137 mg/dL — ABNORMAL HIGH (ref 70–99)

## 2012-11-08 SURGERY — ARTHROSCOPY, KNEE, WITH MEDIAL MENISCECTOMY
Anesthesia: General | Site: Knee | Laterality: Right | Wound class: Clean

## 2012-11-08 MED ORDER — ONDANSETRON HCL 4 MG/2ML IJ SOLN: 4.0000 mg | Freq: Once | INTRAMUSCULAR | Status: DC | PRN

## 2012-11-08 MED ORDER — ONDANSETRON HCL 4 MG/2ML IJ SOLN
INTRAMUSCULAR | Status: DC | PRN
  Administered 2012-11-08: 4 mg via INTRAVENOUS

## 2012-11-08 MED ORDER — MIDAZOLAM HCL 5 MG/5ML IJ SOLN
INTRAMUSCULAR | Status: DC | PRN
  Administered 2012-11-08: 2 mg via INTRAVENOUS

## 2012-11-08 MED ORDER — DEXAMETHASONE SODIUM PHOSPHATE 10 MG/ML IJ SOLN
INTRAMUSCULAR | Status: DC | PRN
  Administered 2012-11-08: 5 mg via INTRAVENOUS

## 2012-11-08 MED ORDER — OXYCODONE HCL 5 MG PO TABS: 5.0000 mg | ORAL_TABLET | Freq: Once | ORAL | Status: DC | PRN

## 2012-11-08 MED ORDER — HYDROMORPHONE HCL PF 1 MG/ML IJ SOLN
0.2500 mg | INTRAMUSCULAR | Status: DC | PRN
  Administered 2012-11-08 (×2): 0.5 mg via INTRAVENOUS

## 2012-11-08 MED ORDER — OXYCODONE HCL 5 MG/5ML PO SOLN: 5.0000 mg | Freq: Once | ORAL | Status: DC | PRN

## 2012-11-08 MED ORDER — SODIUM CHLORIDE 0.9 % IR SOLN
Status: DC | PRN
  Administered 2012-11-08: 4500 mL

## 2012-11-08 MED ORDER — BUPIVACAINE HCL (PF) 0.5 % IJ SOLN
INTRAMUSCULAR | Status: DC | PRN
  Administered 2012-11-08: 20 mL via INTRA_ARTICULAR

## 2012-11-08 MED ORDER — PROPOFOL 10 MG/ML IV BOLUS
INTRAVENOUS | Status: DC | PRN
  Administered 2012-11-08: 300 mg via INTRAVENOUS

## 2012-11-08 MED ORDER — DEXTROSE 5 % IV SOLN
3.0000 g | Freq: Once | INTRAVENOUS | Status: AC
  Administered 2012-11-08: 3 g via INTRAVENOUS

## 2012-11-08 MED ORDER — MIDAZOLAM HCL 2 MG/2ML IJ SOLN: 1.0000 mg | INTRAMUSCULAR | Status: DC | PRN

## 2012-11-08 MED ORDER — LIDOCAINE HCL (CARDIAC) 20 MG/ML IV SOLN
INTRAVENOUS | Status: DC | PRN
  Administered 2012-11-08: 80 mg via INTRAVENOUS

## 2012-11-08 MED ORDER — FENTANYL CITRATE 0.05 MG/ML IJ SOLN
INTRAMUSCULAR | Status: DC | PRN
  Administered 2012-11-08: 100 ug via INTRAVENOUS

## 2012-11-08 MED ORDER — METHYLPREDNISOLONE ACETATE 80 MG/ML IJ SUSP
INTRAMUSCULAR | Status: DC | PRN
  Administered 2012-11-08: 80 mg via INTRA_ARTICULAR

## 2012-11-08 MED ORDER — KETOROLAC TROMETHAMINE 30 MG/ML IJ SOLN
INTRAMUSCULAR | Status: DC | PRN
  Administered 2012-11-08: 30 mg via INTRAVENOUS

## 2012-11-08 MED ORDER — LACTATED RINGERS IV SOLN
INTRAVENOUS | Status: DC
  Administered 2012-11-08 (×2): via INTRAVENOUS

## 2012-11-08 MED ORDER — OXYCODONE-ACETAMINOPHEN 5-325 MG PO TABS: 1.0000 | ORAL_TABLET | ORAL | Status: DC | PRN

## 2012-11-08 MED ORDER — FENTANYL CITRATE 0.05 MG/ML IJ SOLN: 50.0000 ug | INTRAMUSCULAR | Status: DC | PRN

## 2012-11-08 MED ORDER — CEFAZOLIN SODIUM-DEXTROSE 2-3 GM-% IV SOLR: 2.0000 g | INTRAVENOUS | Status: DC

## 2012-11-08 SURGICAL SUPPLY — 41 items
BANDAGE ELASTIC 6 VELCRO ST LF (GAUZE/BANDAGES/DRESSINGS) ×2 IMPLANT
BLADE CUDA 5.5 (BLADE) IMPLANT
BLADE CUDA GRT WHITE 3.5 (BLADE) IMPLANT
BLADE CUTTER GATOR 3.5 (BLADE) ×2 IMPLANT
BLADE CUTTER MENIS 5.5 (BLADE) IMPLANT
BLADE GREAT WHITE 4.2 (BLADE) ×2 IMPLANT
BUR OVAL 4.0 (BURR) IMPLANT
CANISTER OMNI JUG 16 LITER (MISCELLANEOUS) ×2 IMPLANT
CANISTER SUCTION 2500CC (MISCELLANEOUS) IMPLANT
CLOTH BEACON ORANGE TIMEOUT ST (SAFETY) ×2 IMPLANT
CUTTER MENISCUS  4.2MM (BLADE)
CUTTER MENISCUS 4.2MM (BLADE) IMPLANT
DRAPE ARTHROSCOPY W/POUCH 90 (DRAPES) ×2 IMPLANT
DURAPREP 26ML APPLICATOR (WOUND CARE) ×2 IMPLANT
ELECT MENISCUS 165MM 90D (ELECTRODE) IMPLANT
ELECT REM PT RETURN 9FT ADLT (ELECTROSURGICAL)
ELECTRODE REM PT RTRN 9FT ADLT (ELECTROSURGICAL) IMPLANT
GAUZE XEROFORM 1X8 LF (GAUZE/BANDAGES/DRESSINGS) ×2 IMPLANT
GLOVE BIO SURGEON STRL SZ 6.5 (GLOVE) ×2 IMPLANT
GLOVE BIOGEL PI IND STRL 7.0 (GLOVE) ×1 IMPLANT
GLOVE BIOGEL PI IND STRL 8 (GLOVE) ×1 IMPLANT
GLOVE BIOGEL PI INDICATOR 7.0 (GLOVE) ×1
GLOVE BIOGEL PI INDICATOR 8 (GLOVE) ×1
GLOVE EXAM NITRILE EXT CUFF MD (GLOVE) ×2 IMPLANT
GLOVE ORTHO TXT STRL SZ7.5 (GLOVE) ×4 IMPLANT
GOWN PREVENTION PLUS XLARGE (GOWN DISPOSABLE) ×2 IMPLANT
GOWN PREVENTION PLUS XXLARGE (GOWN DISPOSABLE) ×2 IMPLANT
GOWN STRL REIN 2XL XLG LVL4 (GOWN DISPOSABLE) ×2 IMPLANT
HOLDER KNEE FOAM BLUE (MISCELLANEOUS) ×2 IMPLANT
IV NS IRRIG 3000ML ARTHROMATIC (IV SOLUTION) ×4 IMPLANT
KNEE WRAP E Z 3 GEL PACK (MISCELLANEOUS) ×2 IMPLANT
PACK ARTHROSCOPY DSU (CUSTOM PROCEDURE TRAY) ×2 IMPLANT
PACK BASIN DAY SURGERY FS (CUSTOM PROCEDURE TRAY) ×2 IMPLANT
PENCIL BUTTON HOLSTER BLD 10FT (ELECTRODE) IMPLANT
SET ARTHROSCOPY TUBING (MISCELLANEOUS) ×2
SET ARTHROSCOPY TUBING LN (MISCELLANEOUS) ×1 IMPLANT
SPONGE GAUZE 4X4 12PLY (GAUZE/BANDAGES/DRESSINGS) ×4 IMPLANT
SUT ETHILON 3 0 PS 1 (SUTURE) ×2 IMPLANT
SUT VIC AB 3-0 FS2 27 (SUTURE) IMPLANT
TOWEL OR 17X24 6PK STRL BLUE (TOWEL DISPOSABLE) ×2 IMPLANT
WATER STERILE IRR 1000ML POUR (IV SOLUTION) ×2 IMPLANT

## 2012-11-08 NOTE — Anesthesia Preprocedure Evaluation (Signed)
Anesthesia Evaluation  Patient identified by MRN, date of birth, ID band Patient awake    Reviewed: Allergy & Precautions, H&P , NPO status , Patient's Chart, lab work & pertinent test results  Airway Mallampati: II TM Distance: >3 FB Neck ROM: Full    Dental  (+) Teeth Intact and Dental Advisory Given   Pulmonary sleep apnea and Continuous Positive Airway Pressure Ventilation ,          Cardiovascular hypertension, Pt. 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  Reproductive/Obstetrics                           Anesthesia Physical Anesthesia Plan  ASA: III  Anesthesia Plan: General   Post-op Pain Management:    Induction: Intravenous  Airway Management Planned: LMA  Additional Equipment:   Intra-op Plan:   Post-operative Plan: Extubation in OR  Informed Consent: I have reviewed the patients History and Physical, chart, labs and discussed the procedure including the risks, benefits and alternatives for the proposed anesthesia with the patient or authorized representative who has indicated his/her understanding and acceptance.     Plan Discussed with: Anesthesiologist, CRNA and Surgeon  Anesthesia Plan Comments:         Anesthesia Quick Evaluation

## 2012-11-08 NOTE — Anesthesia Procedure Notes (Signed)
Procedure Name: LMA Insertion Date/Time: 11/08/2012 12:39 PM Performed by: Burna Cash Pre-anesthesia Checklist: Patient identified, Emergency Drugs available, Suction available and Patient being monitored Patient Re-evaluated:Patient Re-evaluated prior to inductionOxygen Delivery Method: Circle System Utilized Preoxygenation: Pre-oxygenation with 100% oxygen Intubation Type: IV induction Ventilation: Mask ventilation without difficulty LMA: LMA inserted LMA Size: 5.0 Number of attempts: 1 Airway Equipment and Method: bite block Placement Confirmation: positive ETCO2 Tube secured with: Tape Dental Injury: Teeth and Oropharynx as per pre-operative assessment

## 2012-11-08 NOTE — Transfer of Care (Signed)
Immediate Anesthesia Transfer of Care Note  Patient: Jermaine James  Procedure(s) Performed: Procedure(s) with comments: RIGHT KNEE ARTHROSCOPY WITH PARTIAL MEDIAL AND LATERAL MENISECTOMIES, CHONDROPLASTY (Right) - RIGHT KNEE SCOPE WITH PARTIAL MEDIAL AND LATERAL  MENISCECTOMIES  AND CHONDROPLASTY  Patient Location: PACU  Anesthesia Type:General  Level of Consciousness: awake, alert  and oriented  Airway & Oxygen Therapy: Patient Spontanous Breathing and Patient connected to face mask oxygen  Post-op Assessment: Report given to PACU RN and Post -op Vital signs reviewed and stable  Post vital signs: Reviewed and stable  Complications: No apparent anesthesia complications

## 2012-11-08 NOTE — Brief Op Note (Signed)
11/08/2012  2:44 PM  PATIENT:  Jermaine James  57 y.o. male  PRE-OPERATIVE DIAGNOSIS:  RIGHT KNEE MENISCUS MEDIAL 836   POST-OPERATIVE DIAGNOSIS:  RIGHT KNEE MENISCUS MEDIAL 836 and CHONDROMALACIA  and LATERAL MENISCUS TEAR  PROCEDURE:  Procedure(s) with comments: RIGHT KNEE ARTHROSCOPY WITH PARTIAL MEDIAL AND LATERAL MENISECTOMIES, CHONDROPLASTY (Right) - RIGHT KNEE SCOPE WITH PARTIAL MEDIAL AND LATERAL  MENISCECTOMIES  AND CHONDROPLASTY  SURGEON:  Surgeon(s) and Role:    * Loreta Ave, MD - Primary  PHYSICIAN ASSISTANT: Zonia Kief M     ANESTHESIA:   general  EBL:  Total I/O In: 1422 [P.O.:222; I.V.:1200] Out: -    SPECIMEN:  No Specimen  DISPOSITION OF SPECIMEN:  N/A  COUNTS:  YES  TOURNIQUET:  * No tourniquets in log *  PATIENT DISPOSITION:  PACU - hemodynamically stable.

## 2012-11-08 NOTE — Interval H&P Note (Signed)
History and Physical Interval Note:  11/08/2012 7:26 AM  Jermaine James  has presented today for surgery, with the diagnosis of RIGHT KNEE MENISCUS MEDIAL 836  The various methods of treatment have been discussed with the patient and family. After consideration of risks, benefits and other options for treatment, the patient has consented to  Procedure(s) with comments: RIGHT KNEE ARTHROSCOPY WITH MEDIAL MENISECTOMY (Right) - RIGHT KNEE SCOPE MEDIAL MENISCECTOMY as a surgical intervention .  The patient's history has been reviewed, patient examined, no change in status, stable for surgery.  I have reviewed the patient's chart and labs.  Questions were answered to the patient's satisfaction.     [name]

## 2012-11-09 ENCOUNTER — Encounter (HOSPITAL_BASED_OUTPATIENT_CLINIC_OR_DEPARTMENT_OTHER): Payer: Self-pay | Admitting: Orthopedic Surgery

## 2012-11-09 NOTE — Anesthesia Postprocedure Evaluation (Signed)
  Anesthesia Post-op Note  Patient: Jermaine James  Procedure(s) Performed: Procedure(s) with comments: RIGHT KNEE ARTHROSCOPY WITH PARTIAL MEDIAL AND LATERAL MENISECTOMIES, CHONDROPLASTY (Right) - RIGHT KNEE SCOPE WITH PARTIAL MEDIAL AND LATERAL  MENISCECTOMIES  AND CHONDROPLASTY  Patient Location: PACU  Anesthesia Type:General  Level of Consciousness: awake, alert  and oriented  Airway and Oxygen Therapy: Patient Spontanous Breathing  Post-op Pain: mild  Post-op Assessment: Post-op Vital signs reviewed  Post-op Vital Signs: Reviewed  Complications: No apparent anesthesia complications

## 2012-11-09 NOTE — Op Note (Signed)
NAME:  Jermaine James, Jermaine James NO.:  [id number]  MEDICAL RECORD NO.:  [id number]  LOCATION:                                 FACILITY:  PHYSICIAN:  Loreta Ave, M.D. DATE OF BIRTH:  06-08-1956  DATE OF PROCEDURE:  11/08/2012 DATE OF DISCHARGE:                              OPERATIVE REPORT   PREOPERATIVE DIAGNOSIS:  Right knee medial meniscus tear.  POSTOPERATIVE DIAGNOSIS:  Right knee medial meniscus tear with also flap and radial tears lateral meniscus.  Tricompartmental grade 3 degenerative joint disease.  PROCEDURE:  Right knee exam under anesthesia, arthroscopy.  Partial medial and lateral meniscectomy.  Tricompartmental chondroplasty.  SURGEON:  Loreta Ave, M.D.  ASSISTANT:  Genene Churn. Barry Dienes, [place].  ANESTHESIA:  General.  BLOOD LOSS:  Minimal.  SPECIMENS:  None.  CULTURES:  None.  COMPLICATION:  None.  DRESSINGS:  Soft compressive.  TOURNIQUET:  Not employed.  PROCEDURE:  The patient was brought to operating room, placed on the operating table in supine position.  After adequate anesthesia had been obtained, leg holder applied.  Leg prepped and draped in usual sterile fashion.  Two portals, one each medial and lateral parapatellar. Arthroscope was introduced.  Knee distended and inspected.  Grade 2 and 3 changes patellofemoral joint worse on the trochlea.  Chondroplasty to a stable surface.  Good tracking.  Hypertrophic synovitis throughout debrided.  ACL intact.  Medial compartment grade 3 changes throughout. Debrided.  Marked complex tearing, posterior 3rd medial meniscus. Saucerized out to a stable rim, tapered into remaining meniscus. Laterally diffuse grade 3 changes well mostly on the femur.  Debrided. Flap tear posterior 3rd radial tear, middle 3rd.  Both were saucerized out taping distally.  At completion, the entire knee examined.  No other findings were appreciated.  Cautery was used for hemostasis after synovectomy.   Instruments were removed.  Portals were closed with nylon.  Knee injected intra- articularly with Depo-Medrol and Marcaine.  Sterile compressive dressing applied.  Anesthesia reversed.  Brought to the recovery room.  Tolerated surgery well.  No complications.     Loreta Ave, M.D.     DFM/MEDQ  D:  11/08/2012  T:  11/09/2012  Job:  [id number]

## 2013-02-13 ENCOUNTER — Telehealth: Payer: Self-pay | Admitting: Family Medicine

## 2013-02-13 NOTE — Telephone Encounter (Signed)
If it has been going on for over a week, he should come in to be seen

## 2013-02-13 NOTE — Telephone Encounter (Signed)
Pt states he has a pretty severe cough - non productive x1wk+. Also has nasal congestion & sinus symptoms - no fever - no aches. I asked if he wanted an appt. He declined - he wants advice, as no OTC treatments seem to be improving his symptoms. Please advise.

## 2013-02-13 NOTE — Telephone Encounter (Signed)
I spoke with pt and he is going to schedule a visit for 02/14/13.

## 2013-02-14 ENCOUNTER — Ambulatory Visit (INDEPENDENT_AMBULATORY_CARE_PROVIDER_SITE_OTHER): Payer: BC Managed Care – PPO | Admitting: Family Medicine

## 2013-02-14 ENCOUNTER — Encounter: Payer: Self-pay | Admitting: Family Medicine

## 2013-02-14 VITALS — BP 136/80 | HR 98 | Temp 98.5°F | Wt 311.0 lb

## 2013-02-14 DIAGNOSIS — J209 Acute bronchitis, unspecified: Secondary | ICD-10-CM

## 2013-02-14 MED ORDER — AZITHROMYCIN 250 MG PO TABS: ORAL_TABLET | ORAL | Status: DC

## 2013-02-14 MED ORDER — HYDROCODONE-HOMATROPINE 5-1.5 MG/5ML PO SYRP: 5.0000 mL | ORAL_SOLUTION | ORAL | Status: DC | PRN

## 2013-02-14 NOTE — Progress Notes (Signed)
  Subjective:    Patient ID: Jermaine James, male    DOB: 07-01-1956, 57 y.o.   MRN: [id number]  HPI Here for 2 weeks of a cough producing clear sputum. He has stuffy head with PND and a ST. No fever.    Review of Systems  Constitutional: Negative.   HENT: Positive for congestion and postnasal drip.   Eyes: Negative.   Respiratory: Positive for cough.        Objective:   Physical Exam  Constitutional: He appears well-developed and well-nourished. No distress.  HENT:  Right Ear: External ear normal.  Left Ear: External ear normal.  Nose: Nose normal.  Mouth/Throat: Oropharynx is clear and moist.  Eyes: Conjunctivae are normal.  Pulmonary/Chest: Effort normal and breath sounds normal. No respiratory distress. He has no wheezes. He has no rales.  Lymphadenopathy:    He has no cervical adenopathy.          Assessment & Plan:  Add Mucinex.

## 2013-03-12 ENCOUNTER — Telehealth: Payer: Self-pay | Admitting: Family Medicine

## 2013-03-12 ENCOUNTER — Ambulatory Visit (INDEPENDENT_AMBULATORY_CARE_PROVIDER_SITE_OTHER): Payer: BC Managed Care – PPO | Admitting: Family Medicine

## 2013-03-12 ENCOUNTER — Encounter: Payer: Self-pay | Admitting: Family Medicine

## 2013-03-12 VITALS — BP 132/80 | HR 100 | Temp 98.5°F | Wt 302.0 lb

## 2013-03-12 DIAGNOSIS — R05 Cough: Secondary | ICD-10-CM

## 2013-03-12 MED ORDER — HYDROCODONE-HOMATROPINE 5-1.5 MG/5ML PO SYRP: 5.0000 mL | ORAL_SOLUTION | ORAL | Status: DC | PRN

## 2013-03-12 MED ORDER — AMOXICILLIN-POT CLAVULANATE 875-125 MG PO TABS: 1.0000 | ORAL_TABLET | Freq: Two times a day (BID) | ORAL | Status: DC

## 2013-03-12 NOTE — Telephone Encounter (Signed)
Patient Information:  Caller Name: Josuel  Phone: [phone]  Patient: Jermaine James, Jermaine James  Gender: Male  DOB: [birth year]  Age: 57 Years  PCP: Gershon Crane [facility])  Office Follow Up:  Does the office need to follow up with this patient?: No  Instructions For The Office: N/A  RN Note:  Wife of patient is an Charity fundraiser and is wanting pt to have BNP test.  Wife said that test could only be ran on Wednesday ? RN advised pt that I did not think test was restricted to that day.  Symptoms  Reason For Call & Symptoms: pt reports that he was seen in the office for cough and post nasal drip (02/14/13).  Pt is still having a cough and is not sleeping.  Reviewed Health History In EMR: Yes  Reviewed Medications In EMR: Yes  Reviewed Allergies In EMR: Yes  Reviewed Surgeries / Procedures: Yes  Date of Onset of Symptoms: 02/14/2013  Guideline(s) Used:  Cough  Disposition Per Guideline:   Go to Office Now  Reason For Disposition Reached:   Increasing ankle swelling  Advice Given:  N/A  Patient Will Follow Care Advice:  YES  Appointment Scheduled:  03/12/2013 16:00:00 Appointment Scheduled Provider:  Gershon Crane [facility])

## 2013-03-12 NOTE — Progress Notes (Signed)
  Subjective:    Patient ID: Jermaine James, male    DOB: 06/14/1956, 57 y.o.   MRN: [id number]  HPI Here for a continued hard dry cough. He was here on 02-18-13 for this, and at that time he also had PND and a ST. He took a Zpack and felt somewhat better for awhile, but now for the past week the cough is back. No chest pains. No fever.    Review of Systems  Constitutional: Negative.   HENT: Positive for congestion and postnasal drip.   Eyes: Negative.   Respiratory: Positive for cough.   Cardiovascular: Negative.        Objective:   Physical Exam  Constitutional: He appears well-developed and well-nourished.  HENT:  Right Ear: External ear normal.  Left Ear: External ear normal.  Nose: Nose normal.  Mouth/Throat: Oropharynx is clear and moist.  Eyes: Conjunctivae are normal.  Neck: No thyromegaly present.  Cardiovascular: Normal rate, regular rhythm, normal heart sounds and intact distal pulses.   Pulmonary/Chest: Effort normal and breath sounds normal. No respiratory distress. He has no wheezes. He has no rales.  Musculoskeletal:  Trace ankle edema   Lymphadenopathy:    He has no cervical adenopathy.          Assessment & Plan:  This could be a partially treated bronchitis, so we will give him a course of Augmentin. Doubt it is CHF, but we will get a CXR to be sure. Another possible etiology is an ACE inhibitor cough. If he does not improve with the Augmentin, we plan to stop his Lisinopril.

## 2013-03-13 ENCOUNTER — Ambulatory Visit (INDEPENDENT_AMBULATORY_CARE_PROVIDER_SITE_OTHER)
Admission: RE | Admit: 2013-03-13 | Discharge: 2013-03-13 | Disposition: A | Discharge status: Home / Self Care | Payer: BC Managed Care – PPO | Source: Ambulatory Visit | Attending: Family Medicine | Admitting: Family Medicine

## 2013-03-13 DIAGNOSIS — R05 Cough: Secondary | ICD-10-CM

## 2013-03-14 NOTE — Progress Notes (Signed)
Quick Note:  I left voice message with results. ______ 

## 2013-08-10 ENCOUNTER — Other Ambulatory Visit: Payer: Self-pay | Admitting: Family Medicine

## 2013-08-20 ENCOUNTER — Telehealth: Payer: Self-pay | Admitting: Pulmonary Disease

## 2013-08-20 NOTE — Telephone Encounter (Signed)
°  Called patient to schedule a follow up apt from the recall list. Left messages x3. Sent letter 08/20/12 °

## 2013-08-26 ENCOUNTER — Other Ambulatory Visit: Payer: Self-pay | Admitting: Family Medicine

## 2013-11-26 ENCOUNTER — Other Ambulatory Visit: Payer: Self-pay | Admitting: Family Medicine

## 2013-11-27 ENCOUNTER — Other Ambulatory Visit: Payer: Self-pay | Admitting: Family Medicine

## 2014-01-13 ENCOUNTER — Telehealth: Payer: Self-pay | Admitting: Family Medicine

## 2014-01-13 DIAGNOSIS — E119 Type 2 diabetes mellitus without complications: Secondary | ICD-10-CM

## 2014-01-13 NOTE — Telephone Encounter (Signed)
Can you call pt to schedule lab appointment? 

## 2014-01-13 NOTE — Telephone Encounter (Signed)
Pt calling in requesting lab work to be done so that he can have his lisinopril-hydrochlorothiazide (PRINZIDE,ZESTORETIC) 10-12.5 MG per tablet and metFORMIN (GLUCOPHAGE) 500 MG tablet. Ok to order and schedule labs? Please advise.

## 2014-01-13 NOTE — Telephone Encounter (Signed)
Labs were ordered

## 2014-01-14 NOTE — Telephone Encounter (Signed)
Pt has made lab appt and rov for his refill. Pt states he has 3 days of metFORMIN (GLUCOPHAGE) 500 MG tablet  left.  Can you send refill to [facility] [address]

## 2014-01-15 ENCOUNTER — Other Ambulatory Visit: Payer: BC Managed Care – PPO

## 2014-01-15 MED ORDER — METFORMIN HCL 500 MG PO TABS: ORAL_TABLET | ORAL | Status: DC

## 2014-01-15 NOTE — Telephone Encounter (Signed)
I sent script e-scribe and left a voice message for pt. 

## 2014-01-16 ENCOUNTER — Other Ambulatory Visit (INDEPENDENT_AMBULATORY_CARE_PROVIDER_SITE_OTHER): Payer: BC Managed Care – PPO

## 2014-01-16 DIAGNOSIS — E119 Type 2 diabetes mellitus without complications: Secondary | ICD-10-CM

## 2014-01-16 LAB — BASIC METABOLIC PANEL
BUN: 12 mg/dL (ref 6–23)
CALCIUM: 9.6 mg/dL (ref 8.4–10.5)
CO2: 30 mEq/L (ref 19–32)
CREATININE: 0.8 mg/dL (ref 0.4–1.5)
Chloride: 100 mEq/L (ref 96–112)
GFR: 100.56 mL/min (ref >60.00)
Glucose, Bld: 107 mg/dL — ABNORMAL HIGH (ref 70–99)
Potassium: 4.1 mEq/L (ref 3.5–5.1)
Sodium: 138 mEq/L (ref 135–145)

## 2014-01-16 LAB — HEMOGLOBIN A1C: HEMOGLOBIN A1C: 6.5 % (ref 4.6–6.5)

## 2014-01-23 ENCOUNTER — Ambulatory Visit (INDEPENDENT_AMBULATORY_CARE_PROVIDER_SITE_OTHER): Payer: BC Managed Care – PPO | Admitting: Family Medicine

## 2014-01-23 ENCOUNTER — Encounter: Payer: Self-pay | Admitting: Family Medicine

## 2014-01-23 VITALS — BP 136/78 | HR 91 | Temp 99.0°F | Ht 71.0 in | Wt 308.0 lb

## 2014-01-23 DIAGNOSIS — E119 Type 2 diabetes mellitus without complications: Secondary | ICD-10-CM

## 2014-01-23 DIAGNOSIS — M109 Gout, unspecified: Secondary | ICD-10-CM

## 2014-01-23 DIAGNOSIS — I10 Essential (primary) hypertension: Secondary | ICD-10-CM

## 2014-01-23 DIAGNOSIS — E785 Hyperlipidemia, unspecified: Secondary | ICD-10-CM

## 2014-01-23 MED ORDER — METFORMIN HCL 500 MG PO TABS: ORAL_TABLET | ORAL | Status: DC

## 2014-01-23 MED ORDER — LISINOPRIL-HYDROCHLOROTHIAZIDE 10-12.5 MG PO TABS: ORAL_TABLET | ORAL | Status: DC

## 2014-01-23 NOTE — Progress Notes (Signed)
   Subjective:    Patient ID: Jermaine James, male    DOB: 10-16-1955, 58 y.o.   MRN: [id number]  HPI Here to follow up on HTN and diabetes. His recent labs were excellent with an A1c of 6.5. He feels good. He recently started a weight loss program.    Review of Systems  Constitutional: Negative.   Respiratory: Negative.   Cardiovascular: Negative.        Objective:   Physical Exam  Constitutional: He appears well-developed and well-nourished.  Cardiovascular: Normal rate, regular rhythm, normal heart sounds and intact distal pulses.   Pulmonary/Chest: Effort normal and breath sounds normal.          Assessment & Plan:  Doing well.

## 2014-01-23 NOTE — Progress Notes (Signed)
Pre visit review using our clinic review tool, if applicable. No additional management support is needed unless otherwise documented below in the visit note. 

## 2014-01-24 ENCOUNTER — Telehealth: Payer: Self-pay | Admitting: Family Medicine

## 2014-01-24 NOTE — Telephone Encounter (Signed)
Relevant patient education assigned to patient using Emmi. ° °

## 2014-03-11 ENCOUNTER — Telehealth: Payer: Self-pay | Admitting: *Deleted

## 2014-03-11 DIAGNOSIS — E785 Hyperlipidemia, unspecified: Secondary | ICD-10-CM

## 2014-03-11 DIAGNOSIS — E119 Type 2 diabetes mellitus without complications: Secondary | ICD-10-CM

## 2014-03-11 NOTE — Telephone Encounter (Signed)
Patient will go to the [facility] lab for lipid panel Diabetic bundle

## 2014-03-13 ENCOUNTER — Other Ambulatory Visit (INDEPENDENT_AMBULATORY_CARE_PROVIDER_SITE_OTHER): Payer: BC Managed Care – PPO

## 2014-03-13 DIAGNOSIS — E785 Hyperlipidemia, unspecified: Secondary | ICD-10-CM

## 2014-03-13 DIAGNOSIS — E119 Type 2 diabetes mellitus without complications: Secondary | ICD-10-CM

## 2014-03-13 LAB — LIPID PANEL
CHOLESTEROL: 154 mg/dL (ref 0–200)
HDL: 41.1 mg/dL (ref >39.00)
LDL Cholesterol: 90 mg/dL (ref 0–99)
NonHDL: 112.9
Total CHOL/HDL Ratio: 4
Triglycerides: 113 mg/dL (ref 0.0–149.0)
VLDL: 22.6 mg/dL (ref 0.0–40.0)

## 2014-09-03 ENCOUNTER — Telehealth: Payer: Self-pay | Admitting: Family Medicine

## 2014-09-03 DIAGNOSIS — E119 Type 2 diabetes mellitus without complications: Secondary | ICD-10-CM

## 2014-09-03 NOTE — Telephone Encounter (Signed)
done

## 2014-09-18 ENCOUNTER — Emergency Department (HOSPITAL_COMMUNITY): Payer: BLUE CROSS/BLUE SHIELD

## 2014-09-18 ENCOUNTER — Encounter (HOSPITAL_COMMUNITY): Payer: Self-pay

## 2014-09-18 ENCOUNTER — Emergency Department (HOSPITAL_COMMUNITY)
Admission: EM | Admit: 2014-09-18 | Discharge: 2014-09-18 | Disposition: A | Discharge status: Home / Self Care | Payer: BLUE CROSS/BLUE SHIELD | Attending: Emergency Medicine | Admitting: Emergency Medicine

## 2014-09-18 DIAGNOSIS — N200 Calculus of kidney: Secondary | ICD-10-CM | POA: Insufficient documentation

## 2014-09-18 DIAGNOSIS — I10 Essential (primary) hypertension: Secondary | ICD-10-CM | POA: Insufficient documentation

## 2014-09-18 DIAGNOSIS — R109 Unspecified abdominal pain: Secondary | ICD-10-CM | POA: Diagnosis present

## 2014-09-18 DIAGNOSIS — E669 Obesity, unspecified: Secondary | ICD-10-CM | POA: Diagnosis not present

## 2014-09-18 DIAGNOSIS — Z79899 Other long term (current) drug therapy: Secondary | ICD-10-CM | POA: Insufficient documentation

## 2014-09-18 DIAGNOSIS — E119 Type 2 diabetes mellitus without complications: Secondary | ICD-10-CM | POA: Diagnosis not present

## 2014-09-18 DIAGNOSIS — Z8669 Personal history of other diseases of the nervous system and sense organs: Secondary | ICD-10-CM | POA: Insufficient documentation

## 2014-09-18 DIAGNOSIS — Z8739 Personal history of other diseases of the musculoskeletal system and connective tissue: Secondary | ICD-10-CM | POA: Insufficient documentation

## 2014-09-18 DIAGNOSIS — R52 Pain, unspecified: Secondary | ICD-10-CM

## 2014-09-18 LAB — URINALYSIS, ROUTINE W REFLEX MICROSCOPIC
Bilirubin Urine: NEGATIVE
GLUCOSE, UA: NEGATIVE mg/dL
Ketones, ur: 15 mg/dL — AB
LEUKOCYTES UA: NEGATIVE
NITRITE: NEGATIVE
PH: 5 (ref 5.0–8.0)
Protein, ur: 30 mg/dL — AB
Specific Gravity, Urine: 1.026 (ref 1.005–1.030)
Urobilinogen, UA: 0.2 mg/dL (ref 0.0–1.0)

## 2014-09-18 LAB — URINE MICROSCOPIC-ADD ON

## 2014-09-18 MED ORDER — HYDROMORPHONE HCL 1 MG/ML IJ SOLN
1.0000 mg | Freq: Once | INTRAMUSCULAR | Status: AC
  Administered 2014-09-18: 1 mg via INTRAVENOUS
  Filled 2014-09-18: qty 1

## 2014-09-18 MED ORDER — ONDANSETRON HCL 4 MG/2ML IJ SOLN
4.0000 mg | Freq: Once | INTRAMUSCULAR | Status: AC
  Administered 2014-09-18: 4 mg via INTRAVENOUS
  Filled 2014-09-18: qty 2

## 2014-09-18 MED ORDER — KETOROLAC TROMETHAMINE 30 MG/ML IJ SOLN
30.0000 mg | Freq: Once | INTRAMUSCULAR | Status: AC
  Administered 2014-09-18: 30 mg via INTRAVENOUS
  Filled 2014-09-18: qty 1

## 2014-09-18 MED ORDER — TAMSULOSIN HCL 0.4 MG PO CAPS: 0.4000 mg | ORAL_CAPSULE | Freq: Every day | ORAL | Status: DC

## 2014-09-18 MED ORDER — OXYCODONE-ACETAMINOPHEN 5-325 MG PO TABS: 1.0000 | ORAL_TABLET | ORAL | Status: DC | PRN

## 2014-09-18 NOTE — ED Notes (Signed)
Pt given urine strainer to use at home. Ambulated to exit with significant other without difficulty.

## 2014-09-18 NOTE — Discharge Instructions (Signed)

## 2014-09-18 NOTE — ED Notes (Signed)
Pt to CT at this time.

## 2014-09-18 NOTE — ED Notes (Signed)
Bed: [facility]2 Expected date:  Expected time:  Means of arrival:  Comments: 

## 2014-09-18 NOTE — ED Provider Notes (Signed)
CSN: [id number]     Arrival date & time 09/18/14  2010 History   First MD Initiated Contact with Patient 09/18/14 2030     Chief Complaint  Patient presents with  . Flank Pain     (Consider location/radiation/quality/duration/timing/severity/associated sxs/prior Treatment) HPI Comments: Patient here complaining of sudden onset of left-sided flank pain which began approximately 2 hours prior to arrival. Pain has been colicky and waiting to his groin. No associated hematuria dysuria. No penile drainage or discharge. No rashes noted to his flank. No prior history of the stone. Symptoms are persistent and no treatment use prior to arrival. Denies any syncope or near syncope.  Patient is a 59 y.o. male presenting with flank pain. The history is provided by the patient.  Flank Pain    Past Medical History  Diagnosis Date  . Allergy   . Hypertension   . Diabetes mellitus     type II  . Hyperlipidemia   . Gout   . Osteoarthritis   . Microhematuria     benign, worked up with cystoscopy and CT 09/2009 per Dr. Hessie Diener  . Obesity, Class III, BMI 40-49.9 (morbid obesity)   . 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 Sleep apnea     does use a cpap   Past Surgical History  Procedure Laterality Date  . Traumatic loss of left eye  1961  . Left inguinal hernia repair      age 20  . Excision of lipoma left upper back  1997  . Left knee arthroscopy  1999  . Colonscopy  08-29-05    per Dr. Sharlett Iles, diverticulosis, only repeat in 10 years  . Cystoscopy  09-2009    per Dr. Caryl Pina, clear  . Eye surgery  1961    LEFT  . Knee arthroscopy with medial menisectomy Right 11/08/2012    Procedure: RIGHT KNEE ARTHROSCOPY WITH PARTIAL MEDIAL AND LATERAL MENISECTOMIES, CHONDROPLASTY;  Surgeon: [name], MD;  Location: [place];  Service: Orthopedics;  Laterality: Right;  RIGHT KNEE SCOPE WITH PARTIAL MEDIAL AND LATERAL  MENISCECTOMIES  AND CHONDROPLASTY   Family  History  Problem Relation Age of Onset  . Alcohol abuse Other   . Coronary artery disease Other   . Hypertension Other   . Nephrolithiasis Other   . Stroke Other    History  Substance Use Topics  . Smoking status: Never Smoker   . Smokeless tobacco: Never Used  . Alcohol Use: 0.5 oz/week    1 drink(s) per week    Review of Systems  Genitourinary: Positive for flank pain.  All other systems reviewed and are negative.     Allergies  Review of patient's allergies indicates no known allergies.  Home Medications   Prior to Admission medications   Medication Sig Start Date End Date Taking? Authorizing Provider  ibuprofen (ADVIL,MOTRIN) 200 MG tablet Take 400 mg by mouth every 6 (six) hours as needed. pain   Yes Historical Provider, MD  lisinopril-hydrochlorothiazide (PRINZIDE,ZESTORETIC) 10-12.5 MG per tablet TAKE 1 TABLET BY MOUTH DAILY. 01/23/14  Yes Laurey Morale, MD  metFORMIN (GLUCOPHAGE) 500 MG tablet TAKE 1 TABLET (500 MG TOTAL) BY MOUTH 2 (TWO) TIMES DAILY. 01/23/14  Yes Laurey Morale, MD   BP 156/81 mmHg  Pulse 90  Temp(Src) 97.5 F (36.4 C) (Oral)  Resp 24  SpO2 100% Physical Exam  Constitutional: He is oriented to person, place, and time. He appears well-developed and  well-nourished.  Non-toxic appearance. No distress.  HENT:  Head: Normocephalic and atraumatic.  Eyes: Conjunctivae, EOM and lids are normal. Pupils are equal, round, and reactive to light.  Neck: Normal range of motion. Neck supple. No tracheal deviation present. No thyroid mass present.  Cardiovascular: Normal rate, regular rhythm and normal heart sounds.  Exam reveals no gallop.   No murmur heard. Pulmonary/Chest: Effort normal and breath sounds normal. No stridor. No respiratory distress. He has no decreased breath sounds. He has no wheezes. He has no rhonchi. He has no rales.  Abdominal: Soft. Normal appearance and bowel sounds are normal. He exhibits no distension. There is no tenderness. There  is no rebound and no CVA tenderness.  Musculoskeletal: Normal range of motion. He exhibits no edema or tenderness.  Neurological: He is alert and oriented to person, place, and time. He has normal strength. No cranial nerve deficit or sensory deficit. GCS eye subscore is 4. GCS verbal subscore is 5. GCS motor subscore is 6.  Skin: Skin is warm and dry. No abrasion and no rash noted.  Psychiatric: He has a normal mood and affect. His speech is normal and behavior is normal.  Nursing note and vitals reviewed.   ED Course  Procedures (including critical care time) Labs Review Labs Reviewed  URINALYSIS, ROUTINE W REFLEX MICROSCOPIC    Imaging Review No results found.   EKG Interpretation None      MDM   Final diagnoses:  Pain    Patient given medications for pain and feels better. Stable for d/c    Leota Jacobsen, MD 09/18/14 2258

## 2014-09-18 NOTE — ED Notes (Signed)
Pt complains of flank pain for two hours

## 2014-10-07 ENCOUNTER — Encounter: Payer: Self-pay | Admitting: *Deleted

## 2014-10-07 ENCOUNTER — Encounter: Payer: BLUE CROSS/BLUE SHIELD | Attending: Family Medicine | Admitting: *Deleted

## 2014-10-07 VITALS — Ht 72.0 in | Wt 302.1 lb

## 2014-10-07 DIAGNOSIS — E119 Type 2 diabetes mellitus without complications: Secondary | ICD-10-CM | POA: Insufficient documentation

## 2014-10-07 DIAGNOSIS — Z6841 Body Mass Index (BMI) 40.0 and over, adult: Secondary | ICD-10-CM | POA: Insufficient documentation

## 2014-10-07 DIAGNOSIS — Z713 Dietary counseling and surveillance: Secondary | ICD-10-CM | POA: Diagnosis present

## 2014-10-07 NOTE — Progress Notes (Signed)
Diabetes Self-Management Education  Appt. Start Time: 1630 Appt. End Time: 1800  10/07/2014  Mr. Jermaine James, identified by name and date of birth, is a 59 y.o. male with a diagnosis of Diabetes: Type 2.  Other people present during visit:  Patient presents with objectives of weight loss and decreasing A1c. His Wife Jermaine James is a Therapist, sports with [employer]. She works 3 days a week. Therefore she does the majority of the grocery shopping and cooking. He previously participated in a work weight loss program in which he lost 27# .  Since the program ended he has gained back approximately 18#. He notes that he felt better at the lesser weight. 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 He intention in the return on a regular basis. He activity is limited due to discomfort in his knees. He recognizes that the weight loss will relieve some of the discomfort on his knees.  ASSESSMENT  Height 6' (1.829 m), weight 302 lb 1.6 oz (137.032 kg). Body mass index is 40.96 kg/(m^2).  Initial Visit Information:  Are you currently following a meal plan?: No Are you taking your medications as prescribed?: Yes Are you checking your feet?: No How often do you need to have someone help you when you read instructions, pamphlets, or other written materials from your doctor or pharmacy?: 1 - Never   Psychosocial:   Patient Belief/Attitude about Diabetes: Motivated to manage diabetes Self-care barriers: None Self-management support: Doctor's office, Family, CDE visits Other persons present: Patient Patient Concerns: Nutrition/Meal planning, Healthy Lifestyle, Weight Control Special Needs: None Preferred Learning Style: No preference indicated Learning Readiness: Ready  Complications:   Last HgB A1C per patient/outside source: 7 mg/dL How often do you check your blood sugar?: Not recommended by provider Have you had a dilated eye exam in the past 12 months?: Yes Have you had a dental exam in the  past 12 months?: Yes  Diet Intake:  Breakfast: cheerios, 1% milk, water / 2 eggs, toast, water Snack (morning): none Lunch: salad, greens, grilled chicken, cheese, brocolli, carrots, (add croutons) Snack (afternoon): veggies Dinner: spaghetti, meat sauce,  salad / chicken, rice vegetables Snack (evening): chips & salsa,  Beverage(s): water  Exercise:  Exercise: ADL's  Individualized Plan for Diabetes Self-Management Training:   Learning Objective:  Patient will have a greater understanding of diabetes self-management. Patient education plan per assessed needs and concerns is to attend individual sessions      Education Topics Reviewed with Patient Today:  Factors that contribute to the development of diabetes Role of diet in the treatment of diabetes and the relationship between the three main macronutrients and blood glucose level, Food label reading, portion sizes and measuring food., Carbohydrate counting, Information on hints to eating out and maintain blood glucose control., Meal options for control of blood glucose level and chronic complications. Role of exercise on diabetes management, blood pressure control and cardiac health., Helped patient identify appropriate exercises in relation to his/her diabetes, diabetes complications and other health issue. Identified appropriate SMBG and/or A1C goals. Relationship between chronic complications and blood glucose control, Assessed and discussed foot care and prevention of foot problems, Reviewed with patient heart disease, higher risk of, and prevention  PATIENTS GOALS/Plan (Developed by the patient):  Nutrition: General guidelines for healthy choices and portions discussed Physical Activity: 30 minutes per day Medications: take my medication as prescribed Monitoring : Not Applicable Reducing Risk: do foot checks daily  Plan:   Patient Instructions  Plan:  Aim for 3 Carb Choices per meal (45 grams) +/- 1 either way  Aim  for 0-15 Carbs per snack if hungry  Include protein in moderation with your meals and snacks Consider reading food labels for Total Carbohydrate and Fat Grams of foods Consider  increasing your activity level by exercise/walk for 30 minutes daily as tolerated Continue taking medication as directed by MD  Expected Outcomes:  Demonstrated interest in learning. Expect positive outcomes  Education material provided: Living Well with Diabetes, A1C conversion sheet, Meal plan card, My Plate and Snack sheet  If problems or questions, patient to contact team via:  Phone  Future DSME appointment: PRN

## 2014-10-07 NOTE — Patient Instructions (Signed)
Plan:  Aim for 3 Carb Choices per meal (45 grams) +/- 1 either way  Aim for 0-15 Carbs per snack if hungry  Include protein in moderation with your meals and snacks Consider reading food labels for Total Carbohydrate and Fat Grams of foods Consider  increasing your activity level by exercise/walk for 30 minutes daily as tolerated Continue taking medication as directed by MD

## 2015-02-10 LAB — HM DIABETES EYE EXAM

## 2015-02-13 ENCOUNTER — Other Ambulatory Visit: Payer: Self-pay | Admitting: Family Medicine

## 2015-02-24 ENCOUNTER — Encounter: Payer: Self-pay | Admitting: Family Medicine

## 2015-04-15 ENCOUNTER — Telehealth: Payer: Self-pay | Admitting: Family Medicine

## 2015-04-15 NOTE — Telephone Encounter (Signed)
Pt needs refills on lisinopril-hctz #90 and metformin 500 mg #180 sent to [facility]. Pt has cpx sch for oct 2016

## 2015-04-16 MED ORDER — METFORMIN HCL 500 MG PO TABS: ORAL_TABLET | ORAL | Status: DC

## 2015-04-16 MED ORDER — LISINOPRIL-HYDROCHLOROTHIAZIDE 10-12.5 MG PO TABS: 1.0000 | ORAL_TABLET | Freq: Every day | ORAL | Status: DC

## 2015-04-16 NOTE — Telephone Encounter (Signed)
I sent both scripts e-scribe. 

## 2015-05-12 ENCOUNTER — Other Ambulatory Visit (INDEPENDENT_AMBULATORY_CARE_PROVIDER_SITE_OTHER): Payer: BLUE CROSS/BLUE SHIELD

## 2015-05-12 DIAGNOSIS — Z0000 Encounter for general adult medical examination without abnormal findings: Secondary | ICD-10-CM | POA: Diagnosis not present

## 2015-05-12 LAB — HEPATIC FUNCTION PANEL
ALBUMIN: 4.3 g/dL (ref 3.5–5.2)
ALK PHOS: 52 U/L (ref 39–117)
ALT: 21 U/L (ref 0–53)
AST: 18 U/L (ref 0–37)
BILIRUBIN DIRECT: 0.1 mg/dL (ref 0.0–0.3)
Total Bilirubin: 0.3 mg/dL (ref 0.2–1.2)
Total Protein: 7.4 g/dL (ref 6.0–8.3)

## 2015-05-12 LAB — LIPID PANEL
CHOL/HDL RATIO: 4
Cholesterol: 159 mg/dL (ref 0–200)
HDL: 40 mg/dL (ref >39.00)
LDL CALC: 84 mg/dL (ref 0–99)
NONHDL: 118.64
TRIGLYCERIDES: 174 mg/dL — AB (ref 0.0–149.0)
VLDL: 34.8 mg/dL (ref 0.0–40.0)

## 2015-05-12 LAB — CBC WITH DIFFERENTIAL/PLATELET
BASOS ABS: 0 10*3/uL (ref 0.0–0.1)
Basophils Relative: 0.7 % (ref 0.0–3.0)
EOS ABS: 0.4 10*3/uL (ref 0.0–0.7)
Eosinophils Relative: 5.7 % — ABNORMAL HIGH (ref 0.0–5.0)
HCT: 41.9 % (ref 39.0–52.0)
Hemoglobin: 13.9 g/dL (ref 13.0–17.0)
LYMPHS ABS: 1.9 10*3/uL (ref 0.7–4.0)
Lymphocytes Relative: 26.6 % (ref 12.0–46.0)
MCHC: 33.3 g/dL (ref 30.0–36.0)
MCV: 91.7 fl (ref 78.0–100.0)
MONO ABS: 0.5 10*3/uL (ref 0.1–1.0)
MONOS PCT: 7.4 % (ref 3.0–12.0)
NEUTROS ABS: 4.2 10*3/uL (ref 1.4–7.7)
NEUTROS PCT: 59.6 % (ref 43.0–77.0)
PLATELETS: 270 10*3/uL (ref 150.0–400.0)
RBC: 4.57 Mil/uL (ref 4.22–5.81)
RDW: 13.5 % (ref 11.5–15.5)
WBC: 7 10*3/uL (ref 4.0–10.5)

## 2015-05-12 LAB — POCT URINALYSIS DIPSTICK
BILIRUBIN UA: NEGATIVE
GLUCOSE UA: NEGATIVE
KETONES UA: NEGATIVE
LEUKOCYTES UA: NEGATIVE
NITRITE UA: NEGATIVE
Protein, UA: NEGATIVE
Spec Grav, UA: 1.01
Urobilinogen, UA: 0.2
pH, UA: 6

## 2015-05-12 LAB — TSH: TSH: 1.57 u[IU]/mL (ref 0.35–4.50)

## 2015-05-12 LAB — HEMOGLOBIN A1C: Hgb A1c MFr Bld: 6.1 % (ref 4.6–6.5)

## 2015-05-12 LAB — MICROALBUMIN / CREATININE URINE RATIO
Creatinine,U: 72.4 mg/dL
MICROALB/CREAT RATIO: 1 mg/g (ref 0.0–30.0)
Microalb, Ur: <0.7 mg/dL (ref 0.0–1.9)

## 2015-05-12 LAB — BASIC METABOLIC PANEL
BUN: 14 mg/dL (ref 6–23)
CALCIUM: 9.3 mg/dL (ref 8.4–10.5)
CO2: 29 mEq/L (ref 19–32)
Chloride: 101 mEq/L (ref 96–112)
Creatinine, Ser: 0.81 mg/dL (ref 0.40–1.50)
GFR: 102.97 mL/min (ref >60.00)
GLUCOSE: 122 mg/dL — AB (ref 70–99)
Potassium: 4.7 mEq/L (ref 3.5–5.1)
Sodium: 138 mEq/L (ref 135–145)

## 2015-05-12 LAB — PSA: PSA: 0.43 ng/mL (ref 0.10–4.00)

## 2015-05-19 ENCOUNTER — Ambulatory Visit (INDEPENDENT_AMBULATORY_CARE_PROVIDER_SITE_OTHER): Payer: BLUE CROSS/BLUE SHIELD | Admitting: Family Medicine

## 2015-05-19 ENCOUNTER — Encounter: Payer: Self-pay | Admitting: Family Medicine

## 2015-05-19 VITALS — BP 128/65 | HR 86 | Temp 98.6°F | Ht 72.0 in | Wt 304.0 lb

## 2015-05-19 DIAGNOSIS — Z0000 Encounter for general adult medical examination without abnormal findings: Secondary | ICD-10-CM

## 2015-05-19 MED ORDER — LISINOPRIL-HYDROCHLOROTHIAZIDE 10-12.5 MG PO TABS: 1.0000 | ORAL_TABLET | Freq: Every day | ORAL | Status: DC

## 2015-05-19 MED ORDER — METFORMIN HCL 500 MG PO TABS: ORAL_TABLET | ORAL | Status: DC

## 2015-05-19 NOTE — Progress Notes (Signed)
   Subjective:    Patient ID: Jermaine James, male    DOB: 04-Nov-1955, 59 y.o.   MRN: [id number]  HPI 59 yr old male for a cpx. He has been doing well except for 5 days of a mild intermittent left flank pain just under the rib cage. No burning or urgency to urinate. No blood in the urine. His BMs are regular. He did pass a kidney stone fm the left ureter this past spring, but that felt much different than the current pain does.    Review of Systems  Constitutional: Negative.   HENT: Negative.   Eyes: Negative.   Respiratory: Negative.   Cardiovascular: Negative.   Gastrointestinal: Negative.   Genitourinary: Positive for flank pain. Negative for dysuria, urgency, frequency, hematuria, decreased urine volume, discharge, penile swelling, scrotal swelling, enuresis, difficulty urinating, genital sores, penile pain and testicular pain.  Musculoskeletal: Negative.   Skin: Negative.   Neurological: Negative.   Psychiatric/Behavioral: Negative.        Objective:   Physical Exam  Constitutional: He is oriented to person, place, and time. He appears well-developed and well-nourished. No distress.  Morbidly obese   HENT:  Head: Normocephalic and atraumatic.  Right Ear: External ear normal.  Left Ear: External ear normal.  Nose: Nose normal.  Mouth/Throat: Oropharynx is clear and moist. No oropharyngeal exudate.  Eyes: Conjunctivae and EOM are normal. Pupils are equal, round, and reactive to light. Right eye exhibits no discharge. Left eye exhibits no discharge. No scleral icterus.  Neck: Neck supple. No JVD present. No tracheal deviation present. No thyromegaly present.  Cardiovascular: Normal rate, regular rhythm, normal heart sounds and intact distal pulses.  Exam reveals no gallop and no friction rub.   No murmur heard. EKG normal   Pulmonary/Chest: Effort normal and breath sounds normal. No respiratory distress. He has no wheezes. He has no rales. He exhibits no tenderness.    Abdominal: Soft. Bowel sounds are normal. He exhibits no distension and no mass. There is no tenderness. There is no rebound and no guarding.  Genitourinary: Rectum normal, prostate normal and penis normal. Guaiac negative stool. No penile tenderness.  Musculoskeletal: Normal range of motion. He exhibits no edema or tenderness.  Lymphadenopathy:    He has no cervical adenopathy.  Neurological: He is alert and oriented to person, place, and time. He has normal reflexes. No cranial nerve deficit. He exhibits normal muscle tone. Coordination normal.  Skin: Skin is warm and dry. No rash noted. He is not diaphoretic. No erythema. No pallor.  Psychiatric: He has a normal mood and affect. His behavior is normal. Judgment and thought content normal.          Assessment & Plan:  Well exam. His current flank pain seems to be muscular. He will monitor this and follow up if it gets any worse. Drink lots of water. Set up another colonoscopy.

## 2015-05-19 NOTE — Progress Notes (Signed)
Pre visit review using our clinic review tool, if applicable. No additional management support is needed unless otherwise documented below in the visit note. 

## 2015-09-02 ENCOUNTER — Telehealth: Payer: Self-pay | Admitting: Family Medicine

## 2015-09-02 NOTE — Telephone Encounter (Signed)
Rx called in as directed. Attempted to contact patient - but phone line kept ringing.

## 2015-09-02 NOTE — Telephone Encounter (Signed)
Patient notified that Rx called into pharmacy.

## 2015-09-02 NOTE — Telephone Encounter (Signed)
Call in Indomethacin 50 mg to take TID prn gout pain, #60 with 5 rf

## 2015-09-02 NOTE — Telephone Encounter (Signed)
Patient would like for the doctor to call something into his pharmacy [facility] on [address] for Gout pain.

## 2015-09-03 ENCOUNTER — Encounter: Payer: Self-pay | Admitting: Gastroenterology

## 2015-09-29 ENCOUNTER — Encounter: Payer: Self-pay | Admitting: Internal Medicine

## 2015-10-14 DIAGNOSIS — C44329 Squamous cell carcinoma of skin of other parts of face: Secondary | ICD-10-CM

## 2015-10-14 HISTORY — DX: Squamous cell carcinoma of skin of other parts of face: C44.329

## 2015-10-14 HISTORY — PX: MOHS SURGERY: SUR867

## 2015-12-18 ENCOUNTER — Telehealth: Payer: Self-pay | Admitting: Family Medicine

## 2015-12-18 NOTE — Telephone Encounter (Signed)
Can you call pt to schedule a 30 minute office visit for surgery clearance? I have paperwork at my desk.

## 2015-12-21 ENCOUNTER — Telehealth: Payer: Self-pay | Admitting: Family Medicine

## 2015-12-21 MED ORDER — METHYLPREDNISOLONE 4 MG PO TBPK: ORAL_TABLET | ORAL | Status: DC

## 2015-12-21 NOTE — Telephone Encounter (Signed)
Apply Benadryl lotion to the face, and also call in a Medrol dose pack

## 2015-12-21 NOTE — Telephone Encounter (Signed)
Pt called office directly and has been handled by Dr. Sarajane Jews and Aggie Hacker, RN  [place] - Client [place] Patient Name: Jermaine James Gender: Male DOB: 1956-08-13 Age: 60 Y 4 M 9 D Return Phone Number: [phone] (Primary) Address: City/State/Zip: [place] Client [place] [facility] - Client Client Site [place] - Day Physician Alysia Penna - MD Contact Type Call Who Is Calling Patient / Member / Family / Caregiver Call Type Triage / Clinical Relationship To Patient Self Return Phone Number [phone] (Primary) Chief Complaint Poison [place] Reason for Call Symptomatic / Request for [place] states c/o poison ivy Appointment Disposition EMR Caller Not Reached Info pasted into Epic No Translation No Nurse Assessment Guidelines Guideline Title Affirmed Question Affirmed Notes Nurse Date/Time (Eastern Time) Disp. Time Eilene Ghazi Time) Disposition Final User 12/21/2015 9:14:52 AM Send To Clinical Follow Up Rich Brave, Amy 12/21/2015 9:27:17 AM Attempt made - message left Luther Parody RN, Malachy Mood 12/21/2015 9:43:47 AM Attempt made - no message left Luther Parody, RN, Malachy Mood 12/21/2015 10:04:56 AM FINAL ATTEMPT MADE - message left

## 2015-12-21 NOTE — Telephone Encounter (Signed)
I sent script e-scribe and spoke with pt. 

## 2015-12-21 NOTE — Telephone Encounter (Signed)
Pt has been scheduled.  °

## 2015-12-21 NOTE — Telephone Encounter (Signed)
Pt would like to have something for the poison ivy that he has on his face it can be some over the counter or a Rx.

## 2016-04-12 ENCOUNTER — Encounter: Payer: Self-pay | Admitting: Pulmonary Disease

## 2016-04-12 ENCOUNTER — Ambulatory Visit (INDEPENDENT_AMBULATORY_CARE_PROVIDER_SITE_OTHER): Payer: BLUE CROSS/BLUE SHIELD | Admitting: Pulmonary Disease

## 2016-04-12 VITALS — BP 128/70 | HR 77 | Ht 72.0 in | Wt 306.4 lb

## 2016-04-12 DIAGNOSIS — G4733 Obstructive sleep apnea (adult) (pediatric): Secondary | ICD-10-CM | POA: Diagnosis not present

## 2016-04-12 DIAGNOSIS — Z9989 Dependence on other enabling machines and devices: Principal | ICD-10-CM

## 2016-04-12 DIAGNOSIS — Z6841 Body Mass Index (BMI) 40.0 and over, adult: Secondary | ICD-10-CM

## 2016-04-12 NOTE — Patient Instructions (Signed)
Will get copy of CPAP report  Follow up in 1 year 

## 2016-04-12 NOTE — Progress Notes (Signed)
   Subjective:    Patient ID: Jermaine James., male    DOB: [birth year], 60 y.o.   MRN: [id number]  HPI    Review of Systems  Constitutional: Negative for fever and unexpected weight change.  HENT: Negative for congestion, dental problem, ear pain, nosebleeds, postnasal drip, rhinorrhea, sinus pressure, sneezing, sore throat and trouble swallowing.   Eyes: Negative for redness and itching.  Respiratory: Positive for cough. Negative for chest tightness, shortness of breath and wheezing.   Cardiovascular: Negative for palpitations and leg swelling.  Gastrointestinal: Negative for nausea and vomiting.  Genitourinary: Negative for dysuria.  Musculoskeletal: Positive for joint swelling.  Skin: Negative for rash.  Neurological: Negative for headaches.  Hematological: Does not bruise/bleed easily.  Psychiatric/Behavioral: Negative for dysphoric mood. The patient is not nervous/anxious.        Objective:   Physical Exam        Assessment & Plan:

## 2016-04-12 NOTE — Progress Notes (Signed)
Past Surgical History He  has a past surgical history that includes traumatic loss of left eye (1961); left inguinal hernia repair; excision of lipoma left upper back (1997); left knee arthroscopy (1999); colonscopy (08-29-05); Cystoscopy (09-2009); Eye surgery (1961); and Knee arthroscopy with medial menisectomy (Right, 11/08/2012).  No Known Allergies  Family History His family history includes Alcohol abuse in his other; Coronary artery disease in his other; Hypertension in his other; Nephrolithiasis in his other; Stroke in his other.  Social History He  reports that he has never smoked. He has never used smokeless tobacco. He reports that he drinks about 0.6 - 1.2 oz of alcohol per week . He reports that he does not use drugs.  Review of systems Constitutional: Negative for fever and unexpected weight change.  HENT: Negative for congestion, dental problem, ear pain, nosebleeds, postnasal drip, rhinorrhea, sinus pressure, sneezing, sore throat and trouble swallowing.   Eyes: Negative for redness and itching.  Respiratory: Positive for cough. Negative for chest tightness, shortness of breath and wheezing.   Cardiovascular: Negative for palpitations and leg swelling.  Gastrointestinal: Negative for nausea and vomiting.  Genitourinary: Negative for dysuria.  Musculoskeletal: Positive for joint swelling.  Skin: Negative for rash.  Neurological: Negative for headaches.  Hematological: Does not bruise/bleed easily.  Psychiatric/Behavioral: Negative for dysphoric mood. The patient is not nervous/anxious.     Current Outpatient Prescriptions on File Prior to Visit  Medication Sig  . ibuprofen (ADVIL,MOTRIN) 200 MG tablet Take 800 mg by mouth every 6 (six) hours as needed. pain  . lisinopril-hydrochlorothiazide (PRINZIDE,ZESTORETIC) 10-12.5 MG tablet Take 1 tablet by mouth daily.  . metFORMIN (GLUCOPHAGE) 500 MG tablet TAKE 1 TABLET (500 MG TOTAL) BY MOUTH 2 (TWO) TIMES DAILY.   No current  facility-administered medications on file prior to visit.     Chief Complaint  Patient presents with  . Sleep Consult    Former [facility] patient: Current CPAP - using every night. Sleep study around 2011. Epworth Score: 1    Sleep tests PSG 07/22/05 >> AHI 101, SpO2 low 69%  Past medical history He  has a past medical history of Allergy; Blind right eye; Diabetes mellitus; Gout; Hyperlipidemia; Hypertension; Kidney stone (Feb. 2016 ); Microhematuria; Obesity, Class III, BMI 40-49.9 (morbid obesity) ([place]); Osteoarthritis; and Sleep apnea.  Vital signs BP 128/70 (BP Location: Right Arm, Cuff Size: Normal)   Pulse 77   Ht 6' (1.829 m)   Wt (!) 306 lb 6.4 oz (139 kg)   SpO2 96%   BMI 41.56 kg/m   History of Present Illness Jermaine James. is a 60 y.o. male for evaluation of sleep problems.  He had sleep study in 2006.  He was found to have severe OSA and started on CPAP.  He was seen by Dr. Gwenette Greet in 2013, and got a new CPAP machine.  He was doing well, and didn't think he needed follow up.  He thought it was time for him to get a new machine, and scheduled this appointment.  He uses full face mask.  No issues with mask fit.  He feels CPAP is working well.  He goes to sleep at 10 pm.  He falls asleep in 10 minutes.  He wakes up some times to use the bathroom.  He gets out of bed at 715 am.  He feels rested in the morning.  He denies morning headache.  He does not use anything to help him fall sleep or stay awake.  He denies  sleep walking, sleep talking, bruxism, or nightmares.  There is no history of restless legs.  He denies sleep hallucinations, sleep paralysis, or cataplexy.  He is working on his weight.  The Epworth score is 1 out of 24.   Physical Exam:  General - No distress ENT - No sinus tenderness, no oral exudate, no LAN, no thyromegaly, TM clear, pupils equal/reactive, blind Rt eye, MP 3 Cardiac - s1s2 regular, no murmur, pulses symmetric Chest - No  wheeze/rales/dullness, good air entry, normal respiratory excursion Back - No focal tenderness Abd - Soft, non-tender, no organomegaly, + bowel sounds Ext - No edema Neuro - Normal strength, cranial nerves intact Skin - No rashes Psych - Normal mood, and behavior  Discussion: He has hx of severe obstructive sleep apnea, and has been compliant with CPAP therapy.  He is needing to re-establish sleep medicine follow up.  Assessment/plan:  Obstructive sleep apnea. - will get copy of his CPAP download and call him with results - he will likely be eligible for new CPAP machine in 2018  Obesity. - encouraged him to continue with his weight loss regimen   Patient Instructions  Will get copy of CPAP report   Follow up in 1 year    Chesley Mires, M.D. Pager [phone] 04/12/2016, 4:32 PM

## 2016-04-19 ENCOUNTER — Ambulatory Visit: Payer: BLUE CROSS/BLUE SHIELD | Admitting: Family Medicine

## 2016-04-20 ENCOUNTER — Ambulatory Visit (INDEPENDENT_AMBULATORY_CARE_PROVIDER_SITE_OTHER): Payer: BLUE CROSS/BLUE SHIELD | Admitting: Family Medicine

## 2016-04-20 ENCOUNTER — Encounter: Payer: Self-pay | Admitting: Family Medicine

## 2016-04-20 VITALS — BP 128/60 | Temp 98.5°F | Ht 72.0 in | Wt 302.0 lb

## 2016-04-20 DIAGNOSIS — Z01818 Encounter for other preprocedural examination: Secondary | ICD-10-CM | POA: Diagnosis not present

## 2016-04-20 DIAGNOSIS — E119 Type 2 diabetes mellitus without complications: Secondary | ICD-10-CM | POA: Diagnosis not present

## 2016-04-20 DIAGNOSIS — I10 Essential (primary) hypertension: Secondary | ICD-10-CM

## 2016-04-20 LAB — BASIC METABOLIC PANEL
BUN: 14 mg/dL (ref 6–23)
CALCIUM: 9.4 mg/dL (ref 8.4–10.5)
CO2: 29 meq/L (ref 19–32)
CREATININE: 0.84 mg/dL (ref 0.40–1.50)
Chloride: 101 mEq/L (ref 96–112)
GFR: 98.43 mL/min (ref >60.00)
Glucose, Bld: 154 mg/dL — ABNORMAL HIGH (ref 70–99)
Potassium: 4 mEq/L (ref 3.5–5.1)
Sodium: 138 mEq/L (ref 135–145)

## 2016-04-20 LAB — HEPATIC FUNCTION PANEL
ALT: 21 U/L (ref 0–53)
AST: 17 U/L (ref 0–37)
Albumin: 4.4 g/dL (ref 3.5–5.2)
Alkaline Phosphatase: 53 U/L (ref 39–117)
BILIRUBIN DIRECT: 0.1 mg/dL (ref 0.0–0.3)
BILIRUBIN TOTAL: 0.5 mg/dL (ref 0.2–1.2)
TOTAL PROTEIN: 7.7 g/dL (ref 6.0–8.3)

## 2016-04-20 LAB — CBC WITH DIFFERENTIAL/PLATELET
Basophils Absolute: 0 10*3/uL (ref 0.0–0.1)
Basophils Relative: 0.5 % (ref 0.0–3.0)
Eosinophils Absolute: 0.2 10*3/uL (ref 0.0–0.7)
Eosinophils Relative: 3.5 % (ref 0.0–5.0)
HCT: 42.3 % (ref 39.0–52.0)
Hemoglobin: 14.5 g/dL (ref 13.0–17.0)
Lymphocytes Relative: 22.9 % (ref 12.0–46.0)
Lymphs Abs: 1.6 10*3/uL (ref 0.7–4.0)
MCHC: 34.2 g/dL (ref 30.0–36.0)
MCV: 89.6 fl (ref 78.0–100.0)
Monocytes Absolute: 0.5 10*3/uL (ref 0.1–1.0)
Monocytes Relative: 7.6 % (ref 3.0–12.0)
Neutro Abs: 4.5 10*3/uL (ref 1.4–7.7)
Neutrophils Relative %: 65.5 % (ref 43.0–77.0)
Platelets: 277 10*3/uL (ref 150.0–400.0)
RBC: 4.73 Mil/uL (ref 4.22–5.81)
RDW: 13.3 % (ref 11.5–15.5)
WBC: 6.9 10*3/uL (ref 4.0–10.5)

## 2016-04-20 LAB — POC URINALSYSI DIPSTICK (AUTOMATED)
Bilirubin, UA: NEGATIVE
Glucose, UA: NEGATIVE
KETONES UA: NEGATIVE
Leukocytes, UA: NEGATIVE
Nitrite, UA: NEGATIVE
PH UA: 7.5
SPEC GRAV UA: 1.015
UROBILINOGEN UA: 1

## 2016-04-20 LAB — HEMOGLOBIN A1C: HEMOGLOBIN A1C: 6.5 % (ref 4.6–6.5)

## 2016-04-20 NOTE — Progress Notes (Signed)
Pre visit review using our clinic review tool, if applicable. No additional management support is needed unless otherwise documented below in the visit note. 

## 2016-04-20 NOTE — Progress Notes (Signed)
   Subjective:    Patient ID: Jermaine James., male    DOB: [birth year], 60 y.o.   MRN: [id number]  HPI Here for a preoperative evaluation. He is scheduled on 05-11-16 for a left knee total arthroplasty per Dr. Percell Miller. Other than the knee pain he feels fine. His BP is stable. He does not check glucoses at home. He had a normal EKG last October.    Review of Systems  Constitutional: Negative.   HENT: Negative.   Eyes: Negative.   Respiratory: Negative.   Cardiovascular: Negative.   Gastrointestinal: Negative.   Genitourinary: Negative.   Musculoskeletal: Positive for arthralgias. Negative for back pain, gait problem, joint swelling, myalgias, neck pain and neck stiffness.  Skin: Negative.   Neurological: Negative.   Psychiatric/Behavioral: Negative.        Objective:   Physical Exam  Constitutional: He is oriented to person, place, and time. No distress.  Obese   HENT:  Head: Normocephalic and atraumatic.  Right Ear: External ear normal.  Left Ear: External ear normal.  Nose: Nose normal.  Mouth/Throat: Oropharynx is clear and moist. No oropharyngeal exudate.  Eyes: Conjunctivae and EOM are normal. Pupils are equal, round, and reactive to light. Right eye exhibits no discharge. Left eye exhibits no discharge. No scleral icterus.  Neck: Neck supple. No JVD present. No tracheal deviation present. No thyromegaly present.  Cardiovascular: Normal rate, regular rhythm, normal heart sounds and intact distal pulses.  Exam reveals no gallop and no friction rub.   No murmur heard. Pulmonary/Chest: Effort normal and breath sounds normal. No respiratory distress. He has no wheezes. He has no rales. He exhibits no tenderness.  Abdominal: Soft. Bowel sounds are normal. He exhibits no distension and no mass. There is no tenderness. There is no rebound and no guarding.  Musculoskeletal: Normal range of motion. He exhibits no edema or tenderness.  Lymphadenopathy:    He has no cervical  adenopathy.  Neurological: He is alert and oriented to person, place, and time. He has normal reflexes. No cranial nerve deficit. He exhibits normal muscle tone. Coordination normal.  Skin: Skin is warm and dry. No rash noted. He is not diaphoretic. No erythema. No pallor.  Psychiatric: He has a normal mood and affect. His behavior is normal. Judgment and thought content normal.          Assessment & Plan:  Preoperative evaluation. He seems to be doing well from a medical standpoint. We will get labs today including an A1c. His HTN is stable.  Laurey Morale, MD

## 2016-04-21 LAB — TSH: TSH: 1.31 u[IU]/mL (ref 0.35–4.50)

## 2016-04-25 ENCOUNTER — Telehealth: Payer: Self-pay | Admitting: Pulmonary Disease

## 2016-04-25 DIAGNOSIS — G4733 Obstructive sleep apnea (adult) (pediatric): Secondary | ICD-10-CM

## 2016-04-25 NOTE — Telephone Encounter (Signed)
Spoke with pt. He has not received a SD Card for his CPAP machine from [facility]. VS sent an order for [facility] to do a download but the pt does not have a SD card to download. Order will be placed for [facility] to provide him with a SD card. Nothing further was needed at this time.

## 2016-04-29 ENCOUNTER — Other Ambulatory Visit (HOSPITAL_COMMUNITY): Payer: BLUE CROSS/BLUE SHIELD

## 2016-05-02 ENCOUNTER — Other Ambulatory Visit (HOSPITAL_COMMUNITY): Payer: BLUE CROSS/BLUE SHIELD

## 2016-05-02 NOTE — H&P (Signed)
TOTAL KNEE ADMISSION H&P  Patient is being admitted for left total knee arthroplasty.  Subjective:  Chief Complaint:left knee pain.  HPI: Jermaine James., 60 y.o. male, has a history of pain and functional disability in the left knee due to arthritis and has failed non-surgical conservative treatments for greater than 12 weeks to includeNSAID's and/or analgesics, weight reduction as appropriate and activity modification.  Onset of symptoms was gradual, starting >10 years ago with gradually worsening course since that time. The patient noted prior procedures on the knee to include  arthroscopy and medial meniscal repair by his history on the left knee(s).  Patient currently rates pain in the left knee(s) at 7 out of 10 with activity. Patient has worsening of pain with activity and weight bearing, pain that interferes with activities of daily living and pain with passive range of motion.  Patient has evidence of subchondral sclerosis, periarticular osteophytes and joint space narrowing by imaging studies. . There is no active infection.  Patient Active Problem List   Diagnosis Date Noted  . OSA (obstructive sleep apnea) 07/03/2012  . Cellulitis 07/01/2012  . OSTEOARTHRITIS 10/28/2009  . MICROSCOPIC HEMATURIA 08/04/2009  . ACHILLES TENDINITIS 07/02/2008  . GOUT 10/02/2007  . PSA, INCREASED 08/13/2007  . Type 2 diabetes mellitus ([place]) 04/20/2007  . HYPERLIPIDEMIA 04/20/2007  . SKIN TAG 04/20/2007  . Essential hypertension 03/26/2007  . ALLERGIC RHINITIS 03/26/2007   Past Medical History:  Diagnosis Date  . Allergy   . 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 Diabetes mellitus    type II  . Gout   . Hyperlipidemia   . Hypertension   . Kidney stone Feb. 2016    left ureter   . Microhematuria    benign, worked up with cystoscopy and CT 09/2009 per Dr. Hessie Diener  . Obesity, Class III, BMI 40-49.9 (morbid obesity) ([place])   . Osteoarthritis   . Sleep apnea    does use a  cpap    Past Surgical History:  Procedure Laterality Date  . colonscopy  08-29-05   per Dr. Sharlett Iles, diverticulosis, only repeat in 10 years  . CYSTOSCOPY  09-2009   per Dr. Caryl Pina, clear  . excision of lipoma left upper back  1997  . EYE SURGERY  1961   LEFT  . KNEE ARTHROSCOPY WITH MEDIAL MENISECTOMY Right 11/08/2012   Procedure: RIGHT KNEE ARTHROSCOPY WITH PARTIAL MEDIAL AND LATERAL MENISECTOMIES, CHONDROPLASTY;  Surgeon: [name], MD;  Location: [facility];  Service: Orthopedics;  Laterality: Right;  RIGHT KNEE SCOPE WITH PARTIAL MEDIAL AND LATERAL  MENISCECTOMIES  AND CHONDROPLASTY  . left inguinal hernia repair     age 42  . left knee arthroscopy  1999  . traumatic loss of left eye  1961    No prescriptions prior to admission.   No Known Allergies  Social History  Substance Use Topics  . Smoking status: Never Smoker  . Smokeless tobacco: Never Used  . Alcohol use 0.6 - 1.2 oz/week    1 - 2 Standard drinks or equivalent per week    Family History  Problem Relation Age of Onset  . Alcohol abuse Other   . Coronary artery disease Other   . Hypertension Other   . Nephrolithiasis Other   . Stroke Other      Review of Systems  Constitutional: Negative.   HENT: Negative.   Eyes: Negative.   Respiratory: Negative.   Cardiovascular: Negative.  Gastrointestinal: Negative.   Genitourinary: Negative.   Musculoskeletal: Positive for joint pain.  Skin: Negative.   Neurological: Negative.   Endo/Heme/Allergies: Negative.   Psychiatric/Behavioral: Negative.     Objective:  Physical Exam  Constitutional: He is oriented to person, place, and time. He appears well-developed and well-nourished.  Obese, alert  HENT:  Head: Normocephalic.  Eyes: Conjunctivae and EOM are normal. Pupils are equal, round, and reactive to light.  Neck: No thyromegaly present.  No carotid bruits  Cardiovascular: Normal rate, regular rhythm, normal heart sounds and intact  distal pulses.   Respiratory: Effort normal and breath sounds normal.  GI: Soft. Bowel sounds are normal.  Obese   Neurological: He is alert and oriented to person, place, and time.  Skin: Skin is warm and dry.  Psychiatric: He has a normal mood and affect. His behavior is normal. Judgment and thought content normal.    Vital signs in last 24 hours: Height 6'0" Weight 310 Temp 98.4 Pulse 98 Resp 14  BP 145/87  Labs:   Estimated body mass index is 40.96 kg/m as calculated from the following:   Height as of 04/20/16: 6' (1.829 m).   Weight as of 04/20/16: 137 kg (302 lb).   Imaging Review Plain radiographs demonstrate severe degenerative joint disease of the left knee(s). The overall alignment issignificant varus. The bone quality appears to be good for age and reported activity level.  Assessment/Plan:  End stage arthritis, left knee   The patient history, physical examination, clinical judgment of the provider and imaging studies are consistent with end stage degenerative joint disease of the left knee(s) and total knee arthroplasty is deemed medically necessary. The treatment options including medical management, injection therapy arthroscopy and arthroplasty were discussed at length. The risks and benefits of total knee arthroplasty were presented and reviewed. The risks due to aseptic loosening, infection, stiffness, patella tracking problems, thromboembolic complications and other imponderables were discussed. The patient acknowledged the explanation, agreed to proceed with the plan and consent was signed. Patient is being admitted for inpatient treatment for surgery, pain control, PT, OT, prophylactic antibiotics, VTE prophylaxis, progressive ambulation and ADL's and discharge planning. The patient is planning to be discharged home with home health services  Mike Craze. [place], PA-C  05/02/2016 5:46 PM

## 2016-05-05 NOTE — Pre-Procedure Instructions (Signed)
Jermaine James.  05/05/2016      [facility] [address], [place] [address] Phone: [phone] Fax: [phone]    Your procedure is scheduled on  Wednesday, May 18, 2016  Report to [facility] Admitting at 7:45 A.M.  Call this number if you have problems the morning of surgery:  [phone]   Remember:  Do not eat food or drink liquids after midnight Tuesday, May 17, 2016  Take these medicines the morning of surgery with A SIP OF WATER :None Stop taking Aspirin, vitamins, fish oil and herbal medications. Do not take any NSAIDs ie: Ibuprofen, Advil, Naproxen, BC and Goody Powder or any medication containing Aspirin; stop Wednesday, May 11, 2016.    How to Manage Your Diabetes Before and After Surgery  Why is it important to control my blood sugar before and after surgery? . Improving blood sugar levels before and after surgery helps healing and can limit problems. . A way of improving blood sugar control is eating a healthy diet by: o  Eating less sugar and carbohydrates o  Increasing activity/exercise o  Talking with your doctor about reaching your blood sugar goals . High blood sugars (greater than 180 mg/dL) can raise your risk of infections and slow your recovery, so you will need to focus on controlling your diabetes during the weeks before surgery. . Make sure that the doctor who takes care of your diabetes knows about your planned surgery including the date and location.  How do I manage my blood sugar before surgery? . Check your blood sugar at least 4 times a day, starting 2 days before surgery, to make sure that the level is not too high or low. o Check your blood sugar the morning of your surgery when you wake up and every 2 hours until you get to the Short Stay unit. . If your blood sugar is less than 70 mg/dL, you will need to treat for low blood  sugar: o Do not take insulin. o Treat a low blood sugar (less than 70 mg/dL) with  cup of clear juice (cranberry or apple), 4 glucose tablets, OR glucose gel. o Recheck blood sugar in 15 minutes after treatment (to make sure it is greater than 70 mg/dL). If your blood sugar is not greater than 70 mg/dL on recheck, call [phone] for further instructions. . Report your blood sugar to the short stay nurse when you get to Short Stay.  . If you are admitted to the hospital after surgery: o Your blood sugar will be checked by the staff and you will probably be given insulin after surgery (instead of oral diabetes medicines) to make sure you have good blood sugar levels. o The goal for blood sugar control after surgery is 80-180 mg/dL.  WHAT DO I DO ABOUT MY DIABETES MEDICATION?   Marland Kitchen Do not take oral diabetes medicines (pills) the morning of surgery such as Metformin ( Glucophage).  Patient Signature:  Date:   Nurse Signature:  Date:   Reviewed and Endorsed by [facility] Patient Education Committee, August 2015  Do not wear jewelry, make-up or nail polish.  Do not wear lotions, powders, or perfumes, or deoderant.  Do not shave 48 hours prior to surgery.  Men may shave face and neck.  Do not bring valuables to the hospital.  [facility] is not responsible for any belongings or valuables.  Contacts, dentures or bridgework may not be worn into surgery.  Leave your suitcase in the car.  After surgery it may be brought to your room.  For patients admitted to the hospital, discharge time will be determined by your treatment team.  Patients discharged the day of surgery will not be allowed to drive home.   Name and phone number of your driver:   Special instructions: Shower the night before surgery and the morning of surgery with CHG.  Please read over the following fact sheets that you were given. Pain Booklet, Coughing and Deep Breathing, Blood Transfusion Information, Total Joint  Packet, MRSA Information and Surgical Site Infection Prevention

## 2016-05-06 ENCOUNTER — Encounter (HOSPITAL_COMMUNITY)
Admission: RE | Admit: 2016-05-06 | Discharge: 2016-05-06 | Disposition: A | Discharge status: Home / Self Care | Payer: BLUE CROSS/BLUE SHIELD | Source: Ambulatory Visit | Attending: Orthopedic Surgery | Admitting: Orthopedic Surgery

## 2016-05-06 ENCOUNTER — Ambulatory Visit (HOSPITAL_COMMUNITY)
Admission: RE | Admit: 2016-05-06 | Discharge: 2016-05-06 | Disposition: A | Discharge status: Home / Self Care | Payer: BLUE CROSS/BLUE SHIELD | Source: Ambulatory Visit | Attending: Orthopedic Surgery | Admitting: Orthopedic Surgery

## 2016-05-06 ENCOUNTER — Encounter (HOSPITAL_COMMUNITY): Payer: Self-pay

## 2016-05-06 DIAGNOSIS — J9811 Atelectasis: Secondary | ICD-10-CM | POA: Insufficient documentation

## 2016-05-06 DIAGNOSIS — Z01818 Encounter for other preprocedural examination: Secondary | ICD-10-CM | POA: Insufficient documentation

## 2016-05-06 HISTORY — DX: Family history of other specified conditions: Z84.89

## 2016-05-06 LAB — CBC WITH DIFFERENTIAL/PLATELET
BASOS ABS: 0.1 10*3/uL (ref 0.0–0.1)
Basophils Relative: 1 %
EOS PCT: 5 %
Eosinophils Absolute: 0.3 10*3/uL (ref 0.0–0.7)
HEMATOCRIT: 43 % (ref 39.0–52.0)
HEMOGLOBIN: 14.1 g/dL (ref 13.0–17.0)
LYMPHS ABS: 1.7 10*3/uL (ref 0.7–4.0)
LYMPHS PCT: 24 %
MCH: 30.3 pg (ref 26.0–34.0)
MCHC: 32.8 g/dL (ref 30.0–36.0)
MCV: 92.5 fL (ref 78.0–100.0)
Monocytes Absolute: 0.6 10*3/uL (ref 0.1–1.0)
Monocytes Relative: 8 %
NEUTROS ABS: 4.6 10*3/uL (ref 1.7–7.7)
NEUTROS PCT: 62 %
Platelets: 239 10*3/uL (ref 150–400)
RBC: 4.65 MIL/uL (ref 4.22–5.81)
RDW: 13 % (ref 11.5–15.5)
WBC: 7.2 10*3/uL (ref 4.0–10.5)

## 2016-05-06 LAB — SURGICAL PCR SCREEN
MRSA, PCR: NEGATIVE
Staphylococcus aureus: POSITIVE — AB

## 2016-05-06 LAB — COMPREHENSIVE METABOLIC PANEL
ALK PHOS: 47 U/L (ref 38–126)
ALT: 26 U/L (ref 17–63)
AST: 22 U/L (ref 15–41)
Albumin: 3.9 g/dL (ref 3.5–5.0)
Anion gap: 10 (ref 5–15)
BUN: 13 mg/dL (ref 6–20)
CALCIUM: 9.4 mg/dL (ref 8.9–10.3)
CHLORIDE: 104 mmol/L (ref 101–111)
CO2: 24 mmol/L (ref 22–32)
CREATININE: 0.82 mg/dL (ref 0.61–1.24)
Glucose, Bld: 130 mg/dL — ABNORMAL HIGH (ref 65–99)
Potassium: 3.9 mmol/L (ref 3.5–5.1)
Sodium: 138 mmol/L (ref 135–145)
Total Bilirubin: 0.4 mg/dL (ref 0.3–1.2)
Total Protein: 7.5 g/dL (ref 6.5–8.1)

## 2016-05-06 LAB — URINALYSIS, ROUTINE W REFLEX MICROSCOPIC
Bilirubin Urine: NEGATIVE
GLUCOSE, UA: NEGATIVE mg/dL
Hgb urine dipstick: NEGATIVE
KETONES UR: NEGATIVE mg/dL
LEUKOCYTES UA: NEGATIVE
NITRITE: NEGATIVE
PH: 7 (ref 5.0–8.0)
PROTEIN: NEGATIVE mg/dL
Specific Gravity, Urine: 1.015 (ref 1.005–1.030)

## 2016-05-06 LAB — PROTIME-INR
INR: 0.98
PROTHROMBIN TIME: 13 s (ref 11.4–15.2)

## 2016-05-06 LAB — APTT: APTT: 32 s (ref 24–36)

## 2016-05-06 LAB — ABO/RH: ABO/RH(D): A POS

## 2016-05-06 LAB — TYPE AND SCREEN
ABO/RH(D): A POS
Antibody Screen: NEGATIVE

## 2016-05-06 LAB — GLUCOSE, CAPILLARY: GLUCOSE-CAPILLARY: 156 mg/dL — AB (ref 65–99)

## 2016-05-06 NOTE — Progress Notes (Signed)
Pt denies SOB, chest pain, and being under the care of a cardiologist. Pt denies having a stress test, echo and cardiac cath. Pt denies having a chest x ray within the last year. Pt denies having any recent labs. Pt chart forwarded to anesthesia to review clearance note and chest x ray.

## 2016-05-07 LAB — URINE CULTURE: CULTURE: NO GROWTH

## 2016-05-10 ENCOUNTER — Encounter: Payer: Self-pay | Admitting: Family Medicine

## 2016-05-10 LAB — HM DIABETES EYE EXAM

## 2016-05-13 ENCOUNTER — Telehealth: Payer: Self-pay | Admitting: Pulmonary Disease

## 2016-05-13 NOTE — Telephone Encounter (Signed)
LMOMTCB x 1 

## 2016-05-13 NOTE — Telephone Encounter (Signed)
Called and spoke with Jermaine James at [facility], states that pt's last documented pressure is 14cm, also noted that they've tried reaching out to pt to get download with no success. Called and spoke to pt to make him aware of pressure, also advised that he needed to contact [facility] for a download.  Pt expressed understanding.  Nothing further needed at this time.

## 2016-05-13 NOTE — Telephone Encounter (Signed)
Patient returning call- he states he doesn't want to play phone tag all day for this information and if you can place the information in a mychart message to him that would be great or even in a email - Jermaine James  - pt is at work and may be able to answer phone. -pr

## 2016-05-17 MED ORDER — DEXTROSE 5 % IV SOLN
3.0000 g | INTRAVENOUS | Status: AC
  Administered 2016-05-18: 3 g via INTRAVENOUS
  Filled 2016-05-17: qty 3000

## 2016-05-17 MED ORDER — SODIUM CHLORIDE 0.9 % IV SOLN
1000.0000 mg | INTRAVENOUS | Status: AC
  Administered 2016-05-18: 1000 mg via INTRAVENOUS
  Filled 2016-05-17: qty 10

## 2016-05-17 NOTE — Anesthesia Preprocedure Evaluation (Addendum)
Anesthesia Evaluation  Patient identified by MRN, date of birth, ID band Patient awake    Reviewed: Allergy & Precautions, NPO status , Patient's Chart, lab work & pertinent test results  History of Anesthesia Complications (+) Family history of anesthesia reaction and history of anesthetic complications (father with PONV)  Airway Mallampati: III  TM Distance: >3 FB Neck ROM: Full   Comment: Short, thick neck  Dental  (+) Teeth Intact, Dental Advisory Given   Pulmonary neg shortness of breath, sleep apnea and Continuous Positive Airway Pressure Ventilation , neg COPD, Recent URI  (dry cough),    Pulmonary exam normal breath sounds clear to auscultation       Cardiovascular hypertension, Pt. on medications (-) angina(-) Past MI, (-) Cardiac Stents, (-) Orthopnea, (-) PND and (-) DOE (-) dysrhythmias  Rhythm:Regular Rate:Normal  HLD  EKG 05/06/2016: NSR   Neuro/Psych negative neurological ROS     GI/Hepatic negative GI ROS, Neg liver ROS,   Endo/Other  diabetes (Hgb A1c 6.5), Well Controlled, Type 2, Oral Hypoglycemic AgentsMorbid obesity  Renal/GU Renal disease (h/o nephrolithiasis)     Musculoskeletal  (+) Arthritis , Osteoarthritis,    Abdominal (+) + obese,   Peds  Hematology negative hematology ROS (+)   Anesthesia Other Findings Gout, blind in left eye (artificial eye)  Reproductive/Obstetrics                            Anesthesia Physical Anesthesia Plan  ASA: III  Anesthesia Plan: Spinal   Post-op Pain Management:    Induction: Intravenous  Airway Management Planned: Natural Airway and Simple Face Mask  Additional Equipment:   Intra-op Plan:   Post-operative Plan:   Informed Consent:   Dental advisory given  Plan Discussed with: CRNA  Anesthesia Plan Comments: (I have discussed risks of neuraxial anesthesia including but not limited to infection, bleeding, nerve  injury, back pain, headache, seizures, and failure of block. Patient denies bleeding disorders and is not currently anticoagulated. Labs have been reviewed. Risks and benefits discussed. Discussed backup GA with peripheral nerve block if spinal not successful. All patient's questions answered.   Platelets 298 INR 0.98)       Anesthesia Quick Evaluation

## 2016-05-18 ENCOUNTER — Inpatient Hospital Stay (HOSPITAL_COMMUNITY): Payer: BLUE CROSS/BLUE SHIELD | Admitting: Anesthesiology

## 2016-05-18 ENCOUNTER — Inpatient Hospital Stay (HOSPITAL_COMMUNITY): Payer: BLUE CROSS/BLUE SHIELD | Admitting: Emergency Medicine

## 2016-05-18 ENCOUNTER — Inpatient Hospital Stay (HOSPITAL_COMMUNITY): Payer: BLUE CROSS/BLUE SHIELD

## 2016-05-18 ENCOUNTER — Encounter (HOSPITAL_COMMUNITY)
Admission: RE | Disposition: A | Discharge status: Home Health | Payer: Self-pay | Source: Ambulatory Visit | Attending: Orthopedic Surgery

## 2016-05-18 ENCOUNTER — Inpatient Hospital Stay (HOSPITAL_COMMUNITY)
Admission: RE | Admit: 2016-05-18 | Discharge: 2016-05-20 | DRG: 470 | Disposition: A | Discharge status: Home Health | Payer: BLUE CROSS/BLUE SHIELD | Source: Ambulatory Visit | Attending: Orthopedic Surgery | Admitting: Orthopedic Surgery

## 2016-05-18 DIAGNOSIS — Z79899 Other long term (current) drug therapy: Secondary | ICD-10-CM | POA: Diagnosis not present

## 2016-05-18 DIAGNOSIS — H5461 Unqualified visual loss, right eye, normal vision left eye: Secondary | ICD-10-CM | POA: Diagnosis present

## 2016-05-18 DIAGNOSIS — Z96659 Presence of unspecified artificial knee joint: Secondary | ICD-10-CM

## 2016-05-18 DIAGNOSIS — I10 Essential (primary) hypertension: Secondary | ICD-10-CM | POA: Diagnosis present

## 2016-05-18 DIAGNOSIS — E119 Type 2 diabetes mellitus without complications: Secondary | ICD-10-CM | POA: Diagnosis present

## 2016-05-18 DIAGNOSIS — M109 Gout, unspecified: Secondary | ICD-10-CM | POA: Diagnosis present

## 2016-05-18 DIAGNOSIS — M1712 Unilateral primary osteoarthritis, left knee: Secondary | ICD-10-CM | POA: Diagnosis present

## 2016-05-18 DIAGNOSIS — G4733 Obstructive sleep apnea (adult) (pediatric): Secondary | ICD-10-CM | POA: Diagnosis present

## 2016-05-18 DIAGNOSIS — Z96652 Presence of left artificial knee joint: Secondary | ICD-10-CM

## 2016-05-18 DIAGNOSIS — Z794 Long term (current) use of insulin: Secondary | ICD-10-CM

## 2016-05-18 DIAGNOSIS — Z8249 Family history of ischemic heart disease and other diseases of the circulatory system: Secondary | ICD-10-CM | POA: Diagnosis not present

## 2016-05-18 DIAGNOSIS — Z6841 Body Mass Index (BMI) 40.0 and over, adult: Secondary | ICD-10-CM | POA: Diagnosis not present

## 2016-05-18 HISTORY — PX: TOTAL KNEE ARTHROPLASTY: SHX125

## 2016-05-18 LAB — GLUCOSE, CAPILLARY
GLUCOSE-CAPILLARY: 130 mg/dL — AB (ref 65–99)
GLUCOSE-CAPILLARY: 129 mg/dL — AB (ref 65–99)
Glucose-Capillary: 102 mg/dL — ABNORMAL HIGH (ref 65–99)
Glucose-Capillary: 155 mg/dL — ABNORMAL HIGH (ref 65–99)

## 2016-05-18 SURGERY — ARTHROPLASTY, KNEE, TOTAL
Anesthesia: Spinal | Site: Knee | Laterality: Left

## 2016-05-18 MED ORDER — LACTATED RINGERS IV SOLN
INTRAVENOUS | Status: DC | PRN
  Administered 2016-05-18: 10:00:00 via INTRAVENOUS

## 2016-05-18 MED ORDER — SODIUM CHLORIDE 0.9 % IJ SOLN
INTRAMUSCULAR | Status: DC | PRN
  Administered 2016-05-18: 40 mL via INTRAVENOUS

## 2016-05-18 MED ORDER — BUPIVACAINE HCL (PF) 0.5 % IJ SOLN
INTRAMUSCULAR | Status: AC
  Filled 2016-05-18: qty 30

## 2016-05-18 MED ORDER — HYDROMORPHONE HCL 1 MG/ML IJ SOLN
0.5000 mg | INTRAMUSCULAR | Status: DC | PRN
  Administered 2016-05-18 – 2016-05-19 (×4): 1 mg via INTRAVENOUS
  Filled 2016-05-18 (×4): qty 1

## 2016-05-18 MED ORDER — ONDANSETRON HCL 4 MG/2ML IJ SOLN: 4.0000 mg | Freq: Four times a day (QID) | INTRAMUSCULAR | Status: DC | PRN

## 2016-05-18 MED ORDER — PROPOFOL 10 MG/ML IV BOLUS
INTRAVENOUS | Status: AC
  Filled 2016-05-18: qty 20

## 2016-05-18 MED ORDER — PROPOFOL 10 MG/ML IV BOLUS
INTRAVENOUS | Status: DC | PRN
  Administered 2016-05-18 (×4): 20 mg via INTRAVENOUS

## 2016-05-18 MED ORDER — METOCLOPRAMIDE HCL 5 MG/ML IJ SOLN: 5.0000 mg | Freq: Three times a day (TID) | INTRAMUSCULAR | Status: DC | PRN

## 2016-05-18 MED ORDER — PROPOFOL 1000 MG/100ML IV EMUL
INTRAVENOUS | Status: AC
  Filled 2016-05-18: qty 200

## 2016-05-18 MED ORDER — BUPIVACAINE IN DEXTROSE 0.75-8.25 % IT SOLN
INTRATHECAL | Status: DC | PRN
  Administered 2016-05-18: 2 mL via INTRATHECAL

## 2016-05-18 MED ORDER — BUPIVACAINE LIPOSOME 1.3 % IJ SUSP
INTRAMUSCULAR | Status: DC | PRN
  Administered 2016-05-18: 20 mL

## 2016-05-18 MED ORDER — BUPIVACAINE HCL 0.5 % IJ SOLN
INTRAMUSCULAR | Status: DC | PRN
  Administered 2016-05-18: 10 mL

## 2016-05-18 MED ORDER — FENTANYL CITRATE (PF) 100 MCG/2ML IJ SOLN
25.0000 ug | INTRAMUSCULAR | Status: DC | PRN
  Administered 2016-05-18: 25 ug via INTRAVENOUS
  Administered 2016-05-18: 50 ug via INTRAVENOUS
  Administered 2016-05-18: 25 ug via INTRAVENOUS

## 2016-05-18 MED ORDER — PROPOFOL 500 MG/50ML IV EMUL
INTRAVENOUS | Status: DC | PRN
  Administered 2016-05-18: 50 ug/kg/min via INTRAVENOUS

## 2016-05-18 MED ORDER — BUPIVACAINE LIPOSOME 1.3 % IJ SUSP
20.0000 mL | Freq: Once | INTRAMUSCULAR | Status: DC
  Filled 2016-05-18: qty 20

## 2016-05-18 MED ORDER — FENTANYL CITRATE (PF) 100 MCG/2ML IJ SOLN
INTRAMUSCULAR | Status: DC | PRN
  Administered 2016-05-18 (×2): 50 ug via INTRAVENOUS

## 2016-05-18 MED ORDER — POTASSIUM CHLORIDE IN NACL 20-0.9 MEQ/L-% IV SOLN
INTRAVENOUS | Status: DC
  Administered 2016-05-18 – 2016-05-19 (×2): via INTRAVENOUS
  Filled 2016-05-18 (×2): qty 1000

## 2016-05-18 MED ORDER — ACETAMINOPHEN 325 MG PO TABS: 650.0000 mg | ORAL_TABLET | Freq: Four times a day (QID) | ORAL | Status: DC | PRN

## 2016-05-18 MED ORDER — MIDAZOLAM HCL 5 MG/5ML IJ SOLN
INTRAMUSCULAR | Status: DC | PRN
  Administered 2016-05-18: 1 mg via INTRAVENOUS

## 2016-05-18 MED ORDER — ALUM & MAG HYDROXIDE-SIMETH 200-200-20 MG/5ML PO SUSP: 30.0000 mL | ORAL | Status: DC | PRN

## 2016-05-18 MED ORDER — MENTHOL 3 MG MT LOZG: 1.0000 | LOZENGE | OROMUCOSAL | Status: DC | PRN

## 2016-05-18 MED ORDER — ACETAMINOPHEN 650 MG RE SUPP: 650.0000 mg | Freq: Four times a day (QID) | RECTAL | Status: DC | PRN

## 2016-05-18 MED ORDER — HYDROCHLOROTHIAZIDE 12.5 MG PO CAPS
12.5000 mg | ORAL_CAPSULE | Freq: Every day | ORAL | Status: DC
  Administered 2016-05-19: 12.5 mg via ORAL
  Filled 2016-05-18: qty 1

## 2016-05-18 MED ORDER — CHLORHEXIDINE GLUCONATE 4 % EX LIQD: 60.0000 mL | Freq: Once | CUTANEOUS | Status: DC

## 2016-05-18 MED ORDER — INSULIN ASPART 100 UNIT/ML ~~LOC~~ SOLN
4.0000 [IU] | Freq: Three times a day (TID) | SUBCUTANEOUS | Status: DC
  Administered 2016-05-19 (×2): 4 [IU] via SUBCUTANEOUS

## 2016-05-18 MED ORDER — METOCLOPRAMIDE HCL 5 MG PO TABS: 5.0000 mg | ORAL_TABLET | Freq: Three times a day (TID) | ORAL | Status: DC | PRN

## 2016-05-18 MED ORDER — FENTANYL CITRATE (PF) 100 MCG/2ML IJ SOLN
INTRAMUSCULAR | Status: AC
  Filled 2016-05-18: qty 2

## 2016-05-18 MED ORDER — CELECOXIB 200 MG PO CAPS
200.0000 mg | ORAL_CAPSULE | Freq: Two times a day (BID) | ORAL | Status: DC
  Administered 2016-05-19 (×2): 200 mg via ORAL
  Filled 2016-05-18 (×2): qty 1

## 2016-05-18 MED ORDER — POLYETHYLENE GLYCOL 3350 17 G PO PACK: 17.0000 g | PACK | Freq: Every day | ORAL | Status: DC | PRN

## 2016-05-18 MED ORDER — MAGNESIUM CITRATE PO SOLN: 1.0000 | Freq: Once | ORAL | Status: DC | PRN

## 2016-05-18 MED ORDER — METFORMIN HCL 500 MG PO TABS
500.0000 mg | ORAL_TABLET | Freq: Every day | ORAL | Status: DC
  Administered 2016-05-19: 500 mg via ORAL
  Filled 2016-05-18: qty 1

## 2016-05-18 MED ORDER — CEFAZOLIN SODIUM-DEXTROSE 2-4 GM/100ML-% IV SOLN
2.0000 g | Freq: Four times a day (QID) | INTRAVENOUS | Status: AC
  Administered 2016-05-18 (×2): 2 g via INTRAVENOUS
  Filled 2016-05-18 (×3): qty 100

## 2016-05-18 MED ORDER — BISACODYL 10 MG RE SUPP: 10.0000 mg | Freq: Every day | RECTAL | Status: DC | PRN

## 2016-05-18 MED ORDER — LISINOPRIL-HYDROCHLOROTHIAZIDE 10-12.5 MG PO TABS: 1.0000 | ORAL_TABLET | Freq: Every day | ORAL | Status: DC

## 2016-05-18 MED ORDER — SODIUM CHLORIDE 0.9 % IV SOLN: INTRAVENOUS | Status: DC

## 2016-05-18 MED ORDER — DIPHENHYDRAMINE HCL 12.5 MG/5ML PO ELIX: 12.5000 mg | ORAL_SOLUTION | ORAL | Status: DC | PRN

## 2016-05-18 MED ORDER — PROMETHAZINE HCL 25 MG/ML IJ SOLN: 6.2500 mg | INTRAMUSCULAR | Status: DC | PRN

## 2016-05-18 MED ORDER — ONDANSETRON HCL 4 MG PO TABS: 4.0000 mg | ORAL_TABLET | Freq: Four times a day (QID) | ORAL | Status: DC | PRN

## 2016-05-18 MED ORDER — INSULIN ASPART 100 UNIT/ML ~~LOC~~ SOLN
0.0000 [IU] | Freq: Three times a day (TID) | SUBCUTANEOUS | Status: DC
  Administered 2016-05-19 (×2): 3 [IU] via SUBCUTANEOUS

## 2016-05-18 MED ORDER — OXYCODONE HCL 5 MG PO TABS
5.0000 mg | ORAL_TABLET | ORAL | Status: DC | PRN
  Administered 2016-05-18 – 2016-05-19 (×4): 5 mg via ORAL
  Filled 2016-05-18 (×5): qty 1

## 2016-05-18 MED ORDER — LISINOPRIL 10 MG PO TABS
10.0000 mg | ORAL_TABLET | Freq: Every day | ORAL | Status: DC
  Administered 2016-05-19: 10 mg via ORAL
  Filled 2016-05-18: qty 1

## 2016-05-18 MED ORDER — MIDAZOLAM HCL 2 MG/2ML IJ SOLN
INTRAMUSCULAR | Status: AC
  Filled 2016-05-18: qty 2

## 2016-05-18 MED ORDER — PHENOL 1.4 % MT LIQD: 1.0000 | OROMUCOSAL | Status: DC | PRN

## 2016-05-18 MED ORDER — DOCUSATE SODIUM 100 MG PO CAPS
100.0000 mg | ORAL_CAPSULE | Freq: Two times a day (BID) | ORAL | Status: DC
  Administered 2016-05-18 – 2016-05-19 (×2): 100 mg via ORAL
  Filled 2016-05-18 (×3): qty 1

## 2016-05-18 MED ORDER — SODIUM CHLORIDE 0.9 % IR SOLN
Status: DC | PRN
  Administered 2016-05-18: 1000 mL
  Administered 2016-05-18: 6000 mL

## 2016-05-18 MED ORDER — ASPIRIN EC 325 MG PO TBEC
325.0000 mg | DELAYED_RELEASE_TABLET | Freq: Every day | ORAL | Status: DC
  Administered 2016-05-19: 325 mg via ORAL
  Filled 2016-05-18 (×2): qty 1

## 2016-05-18 MED ORDER — DEXAMETHASONE SODIUM PHOSPHATE 10 MG/ML IJ SOLN
10.0000 mg | Freq: Once | INTRAMUSCULAR | Status: AC
  Administered 2016-05-19: 10 mg via INTRAVENOUS
  Filled 2016-05-18: qty 1

## 2016-05-18 MED ORDER — PHENYLEPHRINE HCL 10 MG/ML IJ SOLN
INTRAMUSCULAR | Status: DC | PRN
  Administered 2016-05-18 (×2): 40 ug via INTRAVENOUS

## 2016-05-18 SURGICAL SUPPLY — 62 items
APL SKNCLS STERI-STRIP NONHPOA (GAUZE/BANDAGES/DRESSINGS) ×1
BANDAGE ACE 4X5 VEL STRL LF (GAUZE/BANDAGES/DRESSINGS) ×3 IMPLANT
BANDAGE ACE 6X5 VEL STRL LF (GAUZE/BANDAGES/DRESSINGS) ×3 IMPLANT
BANDAGE ESMARK 6X9 LF (GAUZE/BANDAGES/DRESSINGS) ×1 IMPLANT
BENZOIN TINCTURE PRP APPL 2/3 (GAUZE/BANDAGES/DRESSINGS) ×3 IMPLANT
BLADE SAG 18X100X1.27 (BLADE) ×6 IMPLANT
BNDG ESMARK 6X9 LF (GAUZE/BANDAGES/DRESSINGS) ×3
BOWL SMART MIX CTS (DISPOSABLE) ×3 IMPLANT
CAP KNEE TOTAL 3 SIGMA ×3 IMPLANT
CEMENT BONE SIMPLEX SPEEDSET (Cement) ×6 IMPLANT
CLOSURE WOUND 1/2 X4 (GAUZE/BANDAGES/DRESSINGS) ×1
COVER SURGICAL LIGHT HANDLE (MISCELLANEOUS) ×3 IMPLANT
CUFF TOURNIQUET SINGLE 34IN LL (TOURNIQUET CUFF) ×3 IMPLANT
DRAPE EXTREMITY T 121X128X90 (DRAPE) ×3 IMPLANT
DRAPE IMP U-DRAPE 54X76 (DRAPES) ×3 IMPLANT
DRAPE INCISE IOBAN 66X45 STRL (DRAPES) ×3 IMPLANT
DRAPE PROXIMA HALF (DRAPES) IMPLANT
DRAPE U-SHAPE 47X51 STRL (DRAPES) ×3 IMPLANT
DRSG AQUACEL AG ADV 3.5X10 (GAUZE/BANDAGES/DRESSINGS) ×3 IMPLANT
DURAPREP 26ML APPLICATOR (WOUND CARE) ×6 IMPLANT
ELECT CAUTERY BLADE 6.4 (BLADE) ×3 IMPLANT
ELECT REM PT RETURN 9FT ADLT (ELECTROSURGICAL) ×3
ELECTRODE REM PT RTRN 9FT ADLT (ELECTROSURGICAL) ×1 IMPLANT
EVACUATOR 1/8 PVC DRAIN (DRAIN) IMPLANT
FACESHIELD WRAPAROUND (MASK) ×3 IMPLANT
GLOVE BIOGEL PI IND STRL 7.0 (GLOVE) ×3 IMPLANT
GLOVE BIOGEL PI INDICATOR 7.0 (GLOVE) ×6
GLOVE ORTHO TXT STRL SZ7.5 (GLOVE) ×6 IMPLANT
GLOVE SURG ORTHO 7.0 STRL STRW (GLOVE) ×6 IMPLANT
GOWN STRL REUS W/ TWL LRG LVL3 (GOWN DISPOSABLE) ×3 IMPLANT
GOWN STRL REUS W/ TWL XL LVL3 (GOWN DISPOSABLE) ×1 IMPLANT
GOWN STRL REUS W/TWL LRG LVL3 (GOWN DISPOSABLE) ×9
GOWN STRL REUS W/TWL XL LVL3 (GOWN DISPOSABLE) ×3
HANDPIECE INTERPULSE COAX TIP (DISPOSABLE) ×3
IMMOBILIZER KNEE 22 UNIV (SOFTGOODS) ×3 IMPLANT
IMMOBILIZER KNEE 24 THIGH 36 (MISCELLANEOUS) IMPLANT
IMMOBILIZER KNEE 24 UNIV (MISCELLANEOUS)
KIT BASIN OR (CUSTOM PROCEDURE TRAY) ×3 IMPLANT
KIT ROOM TURNOVER OR (KITS) ×3 IMPLANT
MANIFOLD NEPTUNE II (INSTRUMENTS) ×3 IMPLANT
NEEDLE 18GX1X1/2 (RX/OR ONLY) (NEEDLE) ×3 IMPLANT
NEEDLE HYPO 25GX1X1/2 BEV (NEEDLE) ×3 IMPLANT
NS IRRIG 1000ML POUR BTL (IV SOLUTION) ×3 IMPLANT
PACK TOTAL JOINT (CUSTOM PROCEDURE TRAY) ×3 IMPLANT
PACK UNIVERSAL I (CUSTOM PROCEDURE TRAY) IMPLANT
PAD ARMBOARD 7.5X6 YLW CONV (MISCELLANEOUS) ×6 IMPLANT
SET HNDPC FAN SPRY TIP SCT (DISPOSABLE) ×1 IMPLANT
STRIP CLOSURE SKIN 1/2X4 (GAUZE/BANDAGES/DRESSINGS) ×2 IMPLANT
SUCTION FRAZIER HANDLE 10FR (MISCELLANEOUS) ×2
SUCTION TUBE FRAZIER 10FR DISP (MISCELLANEOUS) ×1 IMPLANT
SUT MNCRL AB 4-0 PS2 18 (SUTURE) IMPLANT
SUT VIC AB 0 CT1 27 (SUTURE) ×6
SUT VIC AB 0 CT1 27XBRD ANBCTR (SUTURE) ×2 IMPLANT
SUT VIC AB 1 CTX 36 (SUTURE) ×6
SUT VIC AB 1 CTX36XBRD ANBCTR (SUTURE) ×2 IMPLANT
SUT VIC AB 2-0 CT1 27 (SUTURE) ×6
SUT VIC AB 2-0 CT1 TAPERPNT 27 (SUTURE) ×2 IMPLANT
SYR 50ML LL SCALE MARK (SYRINGE) ×3 IMPLANT
SYR CONTROL 10ML LL (SYRINGE) ×3 IMPLANT
TOWEL OR 17X24 6PK STRL BLUE (TOWEL DISPOSABLE) ×3 IMPLANT
TOWEL OR 17X26 10 PK STRL BLUE (TOWEL DISPOSABLE) ×3 IMPLANT
WATER STERILE IRR 1000ML POUR (IV SOLUTION) IMPLANT

## 2016-05-18 NOTE — Progress Notes (Signed)
Orthopedic Tech Progress Note Patient Details:  Jermaine James [birth year] [id number] Put patient in [facility]. CPM Left Knee CPM Left Knee: On Left Knee Flexion (Degrees): 80 Left Knee Extension (Degrees): 0 Additional Comments: trapeze bar patient helper   [name] 05/18/2016, 6:36 PM

## 2016-05-18 NOTE — Anesthesia Procedure Notes (Signed)
Spinal  Patient location during procedure: OR Start time: 05/18/2016 10:05 AM End time: 05/18/2016 10:09 AM Staffing Anesthesiologist: Nilda Simmer Performed: anesthesiologist  Preanesthetic Checklist Completed: patient identified, surgical consent, pre-op evaluation, timeout performed, IV checked, risks and benefits discussed and monitors and equipment checked Spinal Block Patient position: sitting Prep: DuraPrep Patient monitoring: heart rate, cardiac monitor, continuous pulse ox and blood pressure Approach: midline Location: L2-3 Injection technique: single-shot Needle Needle type: Pencan  Needle gauge: 24 G Needle length: 9 cm

## 2016-05-18 NOTE — Interval H&P Note (Signed)
History and Physical Interval Note:  05/18/2016 8:27 AM  Jermaine James.  has presented today for surgery, with the diagnosis of djd left knee  The various methods of treatment have been discussed with the patient and family. After consideration of risks, benefits and other options for treatment, the patient has consented to  Procedure(s): LEFT TOTAL KNEE ARTHROPLASTY (Left) as a surgical intervention .  The patient's history has been reviewed, patient examined, no change in status, stable for surgery.  I have reviewed the patient's chart and labs.  Questions were answered to the patient's satisfaction.     [name]

## 2016-05-18 NOTE — Progress Notes (Signed)
Orthopedic Tech Progress Note Patient Details:  Jermaine James 1956-02-19 [id number]  CPM Left Knee CPM Left Knee: On Left Knee Flexion (Degrees): 0 Left Knee Extension (Degrees): 0 Additional Comments: trapeze bar patient helper   Hildred Priest 05/18/2016, 1:19 PM Viewed order from doctor's order list

## 2016-05-18 NOTE — Anesthesia Postprocedure Evaluation (Signed)
Anesthesia Post Note  Patient: Jermaine James.  Procedure(s) Performed: Procedure(s) (LRB): LEFT TOTAL KNEE ARTHROPLASTY (Left)  Patient location during evaluation: PACU Anesthesia Type: Spinal Level of consciousness: oriented and awake and alert Pain management: pain level controlled Vital Signs Assessment: post-procedure vital signs reviewed and stable Respiratory status: spontaneous breathing and respiratory function stable Cardiovascular status: blood pressure returned to baseline and stable Postop Assessment: no headache and no backache Anesthetic complications: no    Last Vitals:  Vitals:   05/18/16 1400 05/18/16 1410  BP:  110/69  Pulse: 69 76  Resp: 18 17  Temp: 36.1 C     Last Pain:  Vitals:   05/18/16 1226  TempSrc:   PainSc: 0-No pain                 Nilda Simmer

## 2016-05-18 NOTE — Progress Notes (Signed)
Orthopedic Tech Progress Note Patient Details:  Jermaine James Nov 16, 1955 [id number] Off cpm at 1600 Patient ID: Jermaine Mercy., male   DOB: Nov 11, 1955, 60 y.o.   MRN: [id number]   Jermaine James 05/18/2016, 3:57 PM

## 2016-05-18 NOTE — Transfer of Care (Signed)
Immediate Anesthesia Transfer of Care Note  Patient: Jermaine James.  Procedure(s) Performed: Procedure(s): LEFT TOTAL KNEE ARTHROPLASTY (Left)  Patient Location: PACU  Anesthesia Type:MAC and Spinal  Level of Consciousness: awake, alert , oriented, patient cooperative and responds to stimulation  Airway & Oxygen Therapy: Patient Spontanous Breathing and Patient connected to nasal cannula oxygen  Post-op Assessment: Report given to RN, Post -op Vital signs reviewed and stable and Patient moving all extremities X 4  Post vital signs: Reviewed and stable  Last Vitals:  Vitals:   05/18/16 0759  BP: (!) 149/72  Pulse: 95  Resp: 18  Temp: 36.8 C    Last Pain:  Vitals:   05/18/16 0759  TempSrc: Oral         Complications: No apparent anesthesia complications

## 2016-05-18 NOTE — Anesthesia Procedure Notes (Signed)
Procedure Name: MAC Date/Time: 05/18/2016 10:10 AM Performed by: Tressia Miners LEFFEW Pre-anesthesia Checklist: Patient identified, Emergency Drugs available, Suction available, Timeout performed and Patient being monitored Patient Re-evaluated:Patient Re-evaluated prior to inductionOxygen Delivery Method: Simple face mask Placement Confirmation: positive ETCO2

## 2016-05-19 ENCOUNTER — Encounter (HOSPITAL_COMMUNITY): Payer: Self-pay | Admitting: Orthopedic Surgery

## 2016-05-19 LAB — GLUCOSE, CAPILLARY
GLUCOSE-CAPILLARY: 182 mg/dL — AB (ref 65–99)
Glucose-Capillary: 188 mg/dL — ABNORMAL HIGH (ref 65–99)
Glucose-Capillary: 197 mg/dL — ABNORMAL HIGH (ref 65–99)

## 2016-05-19 LAB — BASIC METABOLIC PANEL
ANION GAP: 13 (ref 5–15)
BUN: 9 mg/dL (ref 6–20)
CO2: 27 mmol/L (ref 22–32)
Calcium: 8.6 mg/dL — ABNORMAL LOW (ref 8.9–10.3)
Chloride: 96 mmol/L — ABNORMAL LOW (ref 101–111)
Creatinine, Ser: 0.82 mg/dL (ref 0.61–1.24)
Glucose, Bld: 164 mg/dL — ABNORMAL HIGH (ref 65–99)
POTASSIUM: 3.7 mmol/L (ref 3.5–5.1)
SODIUM: 136 mmol/L (ref 135–145)

## 2016-05-19 LAB — CBC
HCT: 40.8 % (ref 39.0–52.0)
Hemoglobin: 13.3 g/dL (ref 13.0–17.0)
MCH: 30.4 pg (ref 26.0–34.0)
MCHC: 32.6 g/dL (ref 30.0–36.0)
MCV: 93.2 fL (ref 78.0–100.0)
PLATELETS: 258 10*3/uL (ref 150–400)
RBC: 4.38 MIL/uL (ref 4.22–5.81)
RDW: 12.9 % (ref 11.5–15.5)
WBC: 9.5 10*3/uL (ref 4.0–10.5)

## 2016-05-19 MED ORDER — ONDANSETRON HCL 4 MG PO TABS: 4.0000 mg | ORAL_TABLET | Freq: Four times a day (QID) | ORAL | 0 refills | Status: DC | PRN

## 2016-05-19 MED ORDER — DOCUSATE SODIUM 100 MG PO CAPS: 100.0000 mg | ORAL_CAPSULE | Freq: Two times a day (BID) | ORAL | 0 refills | Status: DC

## 2016-05-19 MED ORDER — CELECOXIB 200 MG PO CAPS: 200.0000 mg | ORAL_CAPSULE | Freq: Two times a day (BID) | ORAL | 0 refills | Status: DC

## 2016-05-19 MED ORDER — OXYCODONE-ACETAMINOPHEN 7.5-325 MG PO TABS: 1.0000 | ORAL_TABLET | ORAL | 0 refills | Status: DC | PRN

## 2016-05-19 MED ORDER — ASPIRIN 325 MG PO TBEC: 325.0000 mg | DELAYED_RELEASE_TABLET | Freq: Every day | ORAL | 0 refills | Status: DC

## 2016-05-19 NOTE — Discharge Summary (Signed)
Patient ID: Jermaine James. MRN: [id number] DOB/AGE: 05-09-1956 60 y.o.  Admit date: 05/18/2016 Discharge date: 05/19/2016  Admission Diagnoses:  Principal Problem:   Primary osteoarthritis of left knee Active Problems:   Type 2 diabetes mellitus (HCC)   Essential hypertension   OSA (obstructive sleep apnea)   S/P total knee arthroplasty   Discharge Diagnoses:  Same  Past Medical History:  Diagnosis Date  . Allergy   . 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 Diabetes mellitus    type II  . Family history of adverse reaction to anesthesia    father had PONV  . Gout   . Hyperlipidemia   . Hypertension   . Kidney stone Feb. 2016    left ureter   . Microhematuria    benign, worked up with cystoscopy and CT 09/2009 per Dr. Hessie Diener  . Obesity, Class III, BMI 40-49.9 (morbid obesity) ([place])   . Osteoarthritis   . Osteoarthritis    left knee  . Sleep apnea    does use a cpap  . Wears glasses     Surgeries: Procedure(s): LEFT TOTAL KNEE ARTHROPLASTY on 05/18/2016   Consultants:   Discharged Condition: Improved  Hospital Course: Jermaine James. is an 60 y.o. male who was admitted 05/18/2016 for operative treatment ofPrimary osteoarthritis of left knee. Patient has severe unremitting pain that affects sleep, daily activities, and work/hobbies. After pre-op clearance the patient was taken to the operating room on 05/18/2016 and underwent  Procedure(s): LEFT TOTAL KNEE ARTHROPLASTY.  Uneventful post operative course; discharged to home when pt GOALS MET  Patient was given perioperative antibiotics: Anti-infectives    Start     Dose/Rate Route Frequency Ordered Stop   05/18/16 1600  ceFAZolin (ANCEF) IVPB 2g/100 mL premix     2 g 200 mL/hr over 30 Minutes Intravenous Every 6 hours 05/18/16 1506 05/18/16 2219   05/18/16 0600  ceFAZolin (ANCEF) 3 g in dextrose 5 % 50 mL IVPB     3 g 130 mL/hr over 30 Minutes Intravenous On call to O.R. 05/17/16 1400  05/18/16 1028       Patient was given sequential compression devices, early ambulation, and chemoprophylaxis to prevent DVT.  Patient benefited maximally from hospital stay and there were no complications.    Recent vital signs: Patient Vitals for the past 24 hrs:  BP Temp Temp src Pulse Resp SpO2  05/19/16 1351 (!) 148/88 98.1 F (36.7 C) Oral (!) 106 18 99 %  05/19/16 0429 136/85 98.5 F (36.9 C) Oral 96 16 98 %  05/19/16 0016 126/77 98.9 F (37.2 C) Oral 95 16 97 %  05/18/16 2007 (!) 142/79 98.9 F (37.2 C) Oral 96 16 96 %  05/18/16 1530 123/67 98.2 F (36.8 C) Oral 84 18 97 %  05/18/16 1500 - 97 F (36.1 C) - - - -     Recent laboratory studies:  Recent Labs  05/19/16 0403  WBC 9.5  HGB 13.3  HCT 40.8  PLT 258  NA 136  K 3.7  CL 96*  CO2 27  BUN 9  CREATININE 0.82  GLUCOSE 164*  CALCIUM 8.6*     Discharge Medications:     Medication List    STOP taking these medications   ibuprofen 200 MG tablet Commonly known as:  ADVIL,MOTRIN     TAKE these medications   aspirin 325 MG EC tablet Take 1 tablet (325 mg total) by mouth daily  with breakfast. Start taking on:  05/20/2016   celecoxib 200 MG capsule Commonly known as:  CELEBREX Take 1 capsule (200 mg total) by mouth every 12 (twelve) hours.   docusate sodium 100 MG capsule Commonly known as:  COLACE Take 1 capsule (100 mg total) by mouth 2 (two) times daily.   lisinopril-hydrochlorothiazide 10-12.5 MG tablet Commonly known as:  PRINZIDE,ZESTORETIC Take 1 tablet by mouth daily.   metFORMIN 500 MG tablet Commonly known as:  GLUCOPHAGE TAKE 1 TABLET (500 MG TOTAL) BY MOUTH 2 (TWO) TIMES DAILY.   ondansetron 4 MG tablet Commonly known as:  ZOFRAN Take 1 tablet (4 mg total) by mouth every 6 (six) hours as needed for nausea.   oxyCODONE-acetaminophen 7.5-325 MG tablet Commonly known as:  PERCOCET Take 1 tablet by mouth every 4 (four) hours as needed.       Diagnostic Studies: Dg Chest 2  View  Result Date: 05/06/2016 CLINICAL DATA:  Knee replacement. EXAM: CHEST  2 VIEW COMPARISON:  03/13/2013. FINDINGS: Mediastinum and hilar structures normal. Mild bibasilar subsegmental atelectasis. Heart size stable. No pleural effusion or pneumothorax. IMPRESSION: Mild bibasilar subsegmental atelectasis. Electronically Signed   By: Marcello Moores  Register   On: 05/06/2016 10:43   Dg Knee Left Port  Result Date: 05/18/2016 CLINICAL DATA:  Post op films for left knee replacement EXAM: PORTABLE LEFT KNEE - 1-2 VIEW COMPARISON:  None. FINDINGS: Right knee prosthetic components appear well seated and well aligned. There is no acute fracture or evidence of an operative complication. IMPRESSION: Well-positioned left knee prosthesis. Electronically Signed   By: Lajean Manes M.D.   On: 05/18/2016 12:58    Disposition: 01-Home or Self Care  Discharge Instructions    CPM    Complete by:  As directed    Continuous passive motion machine (CPM):      Use the CPM from 0 to 60 for 6-8 hours per day.      You may increase by 5-10 per day.  You may break it up into 2 or 3 sessions per day.      Use CPM for 3-4 weeks or until you are told to stop.   Call MD / Call 911    Complete by:  As directed    If you experience chest pain or shortness of breath, CALL 911 and be transported to the hospital emergency room.  If you develope a fever above 101 F, pus (white drainage) or increased drainage or redness at the wound, or calf pain, call your surgeon's office.   Change dressing    Complete by:  As directed    Change the dressing on the 5th day after surgery with sterile 4 x 4 inch gauze dressing and apply TED hose.  You may clean the incision with alcohol prior to redressing.   Constipation Prevention    Complete by:  As directed    Drink plenty of fluids.  Prune juice and/or coffee may be helpful.  You may use a stool softener, such as Colace (over the counter) 100 mg twice a day.  Use MiraLax (over the counter)  for constipation as needed but this may take several days to work.  Mag Citrate --OR-- Milk of Magnesia may also be used but follow directions on the label.   Diet - low sodium heart healthy    Complete by:  As directed    Discharge instructions    Complete by:  As directed    1 tab a day for the  next 30 days to prevent blood clots1 tab 2 times a day while on narcotics.  STOOL SOFTENERContinuous passive motion machine (CPM):      Use the CPM from 0 to 90 for 6 hours per day.       You may break it up into 2 or 3 sessions per day.      Use CPM for 2 weeks or until you are told to stop.Take 2 tablets every night with dinner until bowel movement.  LAXITIVE.  Restart if two days since last bowel movementPlace gray foam block, curve side up under heel at all times except when in CPM or when walking.  DO NOT modify, tear, cut, or change in any way the gray foam block.   Do not put a pillow under the knee. Place it under the heel.    Complete by:  As directed    Place yellow block under heel at all times except when up walking or in CPM.  You must sleep in it at night   Increase activity slowly as tolerated    Complete by:  As directed    Patient may shower    Complete by:  As directed    You may shower over the brown dressing.  Once the dressing is removed you may shower without a dressing once there is no drainage.  Do not wash over the wound.  If drainage remains, cover wound with plastic wrap and then shower   TED hose    Complete by:  As directed    Use stockings (TED hose) for 2 weeks on both leg(s).  You may remove them at night for sleeping.      Follow-up Information    [facility] .   Why:  Someone from [place] [facility] [employer]) will contact you to arrange start date and time for therapy. Contact information: [address] [place] [address] [phone]        [name], MD Follow up in 2 week(s).   Specialty:  Orthopedic Surgery Contact  information: [place]. [address] [place] [address] [phone]            Signed: OAK DOREY 05/19/2016, 2:24 PM

## 2016-05-19 NOTE — Progress Notes (Signed)
Subjective: 1 Day Post-Op Procedure(s) (LRB): LEFT TOTAL KNEE ARTHROPLASTY (Left) Patient reports pain as 6 on 0-10 scale and moderate.   Still having difficulty with transfers and walking, but progressing toward goals CPAP tubing a difficult fit with home mask, but tolerable Voiding without difficulty Objective: Vital signs in last 24 hours: Temp:  [97 F (36.1 C)-98.9 F (37.2 C)] 98.1 F (36.7 C) (10/05 1351) Pulse Rate:  [84-106] 106 (10/05 1351) Resp:  [16-18] 18 (10/05 1351) BP: (123-148)/(67-88) 148/88 (10/05 1351) SpO2:  [96 %-99 %] 99 % (10/05 1351)  Intake/Output from previous day: 10/04 0701 - 10/05 0700 In: [id number] [P.O.:240; I.V.:975; IV Piggyback:200] Out: 925 [Urine:900; Blood:25] Intake/Output this shift: Total I/O In: 530 [P.O.:530] Out: 1250 [Urine:1250]   Recent Labs  05/19/16 0403  HGB 13.3    Recent Labs  05/19/16 0403  WBC 9.5  RBC 4.38  HCT 40.8  PLT 258    Recent Labs  05/19/16 0403  NA 136  K 3.7  CL 96*  CO2 27  BUN 9  CREATININE 0.82  GLUCOSE 164*  CALCIUM 8.6*   No results for input(s): LABPT, INR in the last 72 hours.  ABD soft Neurovascular intact Dorsiflexion/Plantar flexion intact  Assessment/Plan: 1 Day Post-Op Procedure(s) (LRB): LEFT TOTAL KNEE ARTHROPLASTY (Left) Advance diet Up with therapy D/C IV fluids Plan for discharge tomorrow, unless more progress in PT allows discharge today  BLAIR ROBERTS 05/19/2016, 2:17 PM

## 2016-05-19 NOTE — Care Management Note (Signed)
Case Management Note  Patient Details  Name: Jermaine James. MRN: [id number] Date of Birth: Apr 10, 1956  Subjective/Objective:  60 yr old gentleman s/p left total knee arthroplasty.                  Action/Plan: Case manager spoke with patient concerning [place] and DME needs. Patient was preoperatively setup with [facility], no changes. He states he has rolling walker, crutches, 3in1. CPM to be delivered to his home.  Will have family support at discharge.     Expected Discharge Date:    05/19/16              Expected Discharge Plan:  [place]  In-House Referral:     Discharge planning Services  CM Consult  Post Acute Care Choice:  Home Health, Durable Medical Equipment Choice offered to:  Patient  DME Arranged:  Walker rolling, CPM, 3-N-1 DME Agency:  [employer]  HH Arranged:  PT [place]:  [facility] (now [facility])  Status of Service:     If discussed at [employer] of Stay Meetings, dates discussed:    Additional Comments:  Ninfa Meeker, RN 05/19/2016, 2:14 PM

## 2016-05-19 NOTE — Progress Notes (Signed)
Orthopedic Tech Progress Note Patient Details:  Jermaine James August 27, 1955 [id number] Put patient in CPM at [place]. CPM Left Knee CPM Left Knee: On Left Knee Flexion (Degrees): 65 Left Knee Extension (Degrees): 0 Additional Comments: trapeze bar patient helper   [name] 05/19/2016, 8:33 PM

## 2016-05-19 NOTE — Progress Notes (Signed)
Patient was not comfortable about being discharged he stated he was told by Dr. Percell Miller that he would be discharged on 05/20/2016. PA called and stated that patient was to be discharged today. Patient wanted to talk with Dr. Percell Miller which I called answering service for patient so that could get a clearer understanding .Went over discharge and also waited for PT to make their final assessment of the patient. Waiting for return call  from  MD to call patient in room .or return call to me.

## 2016-05-19 NOTE — Evaluation (Signed)
Occupational Therapy Evaluation Patient Details Name: Jermaine James. MRN: [id number] DOB: [birth year] Today's Date: 05/19/2016    History of Present Illness Pt is a 60 y/o male s/p L TKA. PMH including but not limited to gout, DM, HTN and obesity.   Clinical Impression   Pt with decline in function and safety with ADLs and ADL mobility with decreased  balance and endurance. Pt would benefit from acute OT services to address impairments to increase level of function and safety    Follow Up Recommendations  Home health OT;Supervision - Intermittent    Equipment Recommendations  Other (comment) (sock aid, LH shoe horn, LH sponge)    Recommendations for Other Services       Precautions / Restrictions Precautions Precautions: Knee;Fall Precaution Booklet Issued: Yes (comment) Precaution Comments: PT reviewed positioning of LE following TKA with pt and his wife. Restrictions Weight Bearing Restrictions: Yes LLE Weight Bearing: Weight bearing as tolerated      Mobility Bed Mobility Overal bed mobility: Needs Assistance Bed Mobility: Supine to Sit     Supine to sit: HOB elevated;Min assist     General bed mobility comments: pt required increased time, use of bed rails and min A with L LE movement when scooting forwards in sitting  Transfers Overall transfer level: Needs assistance Equipment used: Rolling walker (2 wheeled) Transfers: Sit to/from Stand Sit to Stand: Min guard         General transfer comment: pt required increased time, VC'ing for bilateral hand placement and min guard for safety    Balance Overall balance assessment: Needs assistance Sitting-balance support: Feet supported;No upper extremity supported Sitting balance-Leahy Scale: Fair     Standing balance support: During functional activity;Bilateral upper extremity supported Standing balance-Leahy Scale: Poor Standing balance comment: pt reliant on bilateral UEs on RW                             ADL Overall ADL's : Needs assistance/impaired     Grooming: Wash/dry face;Wash/dry hands;Set up;Sitting   Upper Body Bathing: Set up;Sitting   Lower Body Bathing: Moderate assistance   Upper Body Dressing : Set up;Sitting   Lower Body Dressing: Maximal assistance   Toilet Transfer: Min guard;RW;Ambulation;Cueing for safety Toilet Transfer Details (indicate cue type and reason): simulated to chair Toileting- Clothing Manipulation and Hygiene: Moderate assistance   Tub/ Shower Transfer: Nurse, learning disability Details (indicate cue type and reason): simulated Functional mobility during ADLs: Min guard;Cueing for safety General ADL Comments: initiated ADL A/E education     Vision Vision Assessment?: No apparent visual deficits              Pertinent Vitals/Pain Pain Assessment: Faces Faces Pain Scale: Hurts a little bit Pain Location: L knee Pain Descriptors / Indicators: Guarding;Sore Pain Intervention(s): Monitored during session;Repositioned;Ice applied     Hand Dominance Right   Extremity/Trunk Assessment Upper Extremity Assessment Upper Extremity Assessment: Overall WFL for tasks assessed   Lower Extremity Assessment Lower Extremity Assessment: Defer to PT evaluation LLE Deficits / Details: Pt with decreased strength and ROM limitations secondary to post-op. Pt with 57 degrees of flexion and lacking 20 degrees to neutral for extension (measured in supine).   Cervical / Trunk Assessment Cervical / Trunk Assessment: Normal   Communication Communication Communication: No difficulties   Cognition Arousal/Alertness: Awake/alert Behavior During Therapy: WFL for tasks assessed/performed Overall Cognitive Status: Within Functional Limits for tasks assessed  General Comments   pt very pleasant and cooperative                Home Living Family/patient expects to be discharged to:: Private  residence Living Arrangements: Spouse/significant other;Other relatives Available Help at Discharge: Family Type of Home: House Home Access: Stairs to enter [employer] of Steps: 2-3 Entrance Stairs-Rails: Right Home Layout: Two level;Able to live on main level with bedroom/bathroom     Bathroom Shower/Tub: Teacher, early years/pre: Standard     Home Equipment: Bedside commode;Walker - 2 wheels;Adaptive equipment;Tub bench Adaptive Equipment: Reacher        Prior Functioning/Environment Level of Independence: Independent                 OT Problem List: Decreased activity tolerance;Decreased knowledge of use of DME or AE;Pain;Obesity;Impaired balance (sitting and/or standing)   OT Treatment/Interventions: Self-care/ADL training;DME and/or AE instruction;Therapeutic activities;Patient/family education    OT Goals(Current goals can be found in the care plan section) Acute Rehab OT Goals Patient Stated Goal: return home OT Goal Formulation: With patient/family Time For Goal Achievement: 05/26/16 Potential to Achieve Goals: Good ADL Goals Pt Will Perform Grooming: with min guard assist;standing;with min assist Pt Will Perform Lower Body Bathing: with min assist;with caregiver independent in assisting;with adaptive equipment Pt Will Perform Lower Body Dressing: with mod assist;with caregiver independent in assisting;with adaptive equipment Pt Will Transfer to Toilet: with supervision;with modified independence;regular height toilet;grab bars;ambulating Pt Will Perform Toileting - Clothing Manipulation and hygiene: with min guard assist Pt Will Perform Tub/Shower Transfer: with supervision;with modified independence;tub bench  OT Frequency: Min 2X/week   Barriers to D/C:    no barriers       Co-evaluation PT/OT/SLP Co-Evaluation/Treatment: Yes Reason for Co-Treatment: For patient/therapist safety PT goals addressed during session: Mobility/safety  with mobility;Balance;Proper use of DME;Strengthening/ROM OT goals addressed during session: ADL's and self-care      End of Session Equipment Utilized During Treatment: Gait belt;Rolling walker CPM Left Knee CPM Left Knee: Off  Activity Tolerance: Patient tolerated treatment well Patient left: in chair;with call bell/phone within reach;with family/visitor present   Time: [id number] OT Time Calculation (min): 27 min Charges:  OT General Charges $OT Visit: 1 Procedure OT Evaluation $OT Eval Moderate Complexity: 1 Procedure G-Codes:    Britt Bottom 05/19/2016, 1:45 PM

## 2016-05-19 NOTE — Progress Notes (Signed)
Received report from [facility] whom explained that pt was set up for discharge home today after last PT session. She had been speaking with the PA regarding the discharge plans. Patient stated he wanted to speak with Dr Percell Miller regarding discharge since he was told by Percell Miller he would actually be discharged on 10/6. Lynn Ito had been speaking over the phone with the on call service regarding this event. As I took over care of patient after shift change I received a call from on call service stating they spoke with Dr Percell Miller who is okay for patient to stay the night, and will see PA Bryan in the morning before discharge home. Pt made aware of conversation and was fine with current plan of discharge tomorrow 10/6. Patient called wife to inform her of status.

## 2016-05-19 NOTE — Evaluation (Signed)
Physical Therapy Evaluation Patient Details Name: Jermaine James. MRN: [id number] DOB: 1956-05-11 Today's Date: 05/19/2016   History of Present Illness  Pt is a 60 y/o male s/p L TKA. PMH including but not limited to gout, DM, HTN and obesity.  Clinical Impression  Pt presented supine in bed with HOB elevated, awake and willing to participate in therapy session. Prior to admission, pt stated that he was independent with all functional mobility. Pt moving well during evaluation and was able to ambulate ~15' with RW and min guard for safety. Pt would continue to benefit from skilled physical therapy services at this time while admitted and after d/c to address his below listed limitations in order to improve his overall safety and independence with functional mobility.     Follow Up Recommendations Home health PT;Supervision for mobility/OOB    Equipment Recommendations  None recommended by PT;Other (comment) (pt reported having all necessary DME at home)    Recommendations for Other Services       Precautions / Restrictions Precautions Precautions: Knee;Fall Precaution Booklet Issued: Yes (comment) Precaution Comments: PT reviewed positioning of LE following TKA with pt and his wife. Restrictions Weight Bearing Restrictions: Yes LLE Weight Bearing: Weight bearing as tolerated      Mobility  Bed Mobility Overal bed mobility: Needs Assistance Bed Mobility: Supine to Sit     Supine to sit: HOB elevated;Min assist     General bed mobility comments: pt required increased time, use of bed rails and min A with L LE movement when scooting forwards in sitting  Transfers Overall transfer level: Needs assistance Equipment used: Rolling walker (2 wheeled) Transfers: Sit to/from Stand Sit to Stand: Min guard         General transfer comment: pt required increased time, VC'ing for bilateral hand placement and min guard for safety  Ambulation/Gait Ambulation/Gait assistance:  Min guard Ambulation Distance (Feet): 15 Feet Assistive device: Rolling walker (2 wheeled) Gait Pattern/deviations: Step-to pattern;Decreased step length - right;Decreased stance time - left;Decreased weight shift to left Gait velocity: decreased Gait velocity interpretation: Below normal speed for age/gender General Gait Details: pt required VC'ing for sequencing with RW  Stairs            Wheelchair Mobility    Modified Rankin (Stroke Patients Only)       Balance Overall balance assessment: Needs assistance Sitting-balance support: Feet supported;No upper extremity supported Sitting balance-Leahy Scale: Fair     Standing balance support: During functional activity;Bilateral upper extremity supported Standing balance-Leahy Scale: Poor Standing balance comment: pt reliant on bilateral UEs on RW                             Pertinent Vitals/Pain Pain Assessment: Faces Faces Pain Scale: Hurts a little bit Pain Location: L knee Pain Descriptors / Indicators: Sore;Guarding Pain Intervention(s): Monitored during session;Repositioned;Ice applied    Home Living Family/patient expects to be discharged to:: Private residence Living Arrangements: Spouse/significant other;Other relatives Available Help at Discharge: Family Type of Home: House Home Access: Stairs to enter Entrance Stairs-Rails: Right Entrance Stairs-Number of Steps: 2-3 Home Layout: Two level;Able to live on main level with bedroom/bathroom Home Equipment: Bedside commode;Walker - 2 wheels;Adaptive equipment;Tub bench      Prior Function Level of Independence: Independent               Hand Dominance   Dominant Hand: Right    Extremity/Trunk Assessment   Upper Extremity  Assessment: Defer to OT evaluation           Lower Extremity Assessment: LLE deficits/detail   LLE Deficits / Details: Pt with decreased strength and ROM limitations secondary to post-op. Pt with 57 degrees of  flexion and lacking 20 degrees to neutral for extension (measured in supine).  Cervical / Trunk Assessment: Normal  Communication   Communication: No difficulties  Cognition Arousal/Alertness: Awake/alert Behavior During Therapy: WFL for tasks assessed/performed Overall Cognitive Status: Within Functional Limits for tasks assessed                      General Comments      Exercises Total Joint Exercises Goniometric ROM: Flexion = 57 degrees; Extension = lacking 20 degrees to neutral; measured in supine   Assessment/Plan    PT Assessment Patient needs continued PT services  PT Problem List Decreased strength;Decreased range of motion;Decreased activity tolerance;Decreased balance;Decreased mobility;Decreased coordination;Decreased knowledge of use of DME;Pain          PT Treatment Interventions DME instruction;Gait training;Stair training;Therapeutic activities;Functional mobility training;Therapeutic exercise;Balance training;Neuromuscular re-education;Patient/family education    PT Goals (Current goals can be found in the Care Plan section)  Acute Rehab PT Goals Patient Stated Goal: return home PT Goal Formulation: With patient/family Time For Goal Achievement: 06/02/16 Potential to Achieve Goals: Good    Frequency 7X/week   Barriers to discharge        Co-evaluation PT/OT/SLP Co-Evaluation/Treatment: Yes Reason for Co-Treatment: For patient/therapist safety PT goals addressed during session: Mobility/safety with mobility;Balance;Proper use of DME;Strengthening/ROM         End of Session Equipment Utilized During Treatment: Gait belt Activity Tolerance: Patient limited by pain Patient left: in chair;with call bell/phone within reach;with family/visitor present Nurse Communication: Mobility status         Time: [id number] PT Time Calculation (min) (ACUTE ONLY): 23 min   Charges:   PT Evaluation $PT Eval Moderate Complexity: 1 Procedure     PT  G CodesClearnce Sorrel Avana Kreiser 05/19/2016, 11:13 AM Sherie Don, PT, DPT [phone]

## 2016-05-19 NOTE — Progress Notes (Signed)
Physical Therapy Treatment Patient Details Name: Jermaine James. MRN: [id number] DOB: 08-27-1955 Today's Date: 05/19/2016    History of Present Illness Pt is a 60 y/o male s/p L TKA. PMH including but not limited to gout, DM, HTN and obesity.    PT Comments    Pt presented supine in bed with HOB elevated, awake and willing to participate in therapy session. Pt making great progress and successfully completed stair training during this session. Pt would continue to benefit from skilled physical therapy services at this time while admitted and after d/c to address his limitations in order to improve his overall safety and independence with functional mobility.   Follow Up Recommendations  Home health PT;Supervision for mobility/OOB     Equipment Recommendations  None recommended by PT    Recommendations for Other Services       Precautions / Restrictions Precautions Precautions: Knee;Fall Precaution Booklet Issued: Yes (comment) Precaution Comments: PT reviewed positioning of LE following TKA with pt and his wife. Restrictions Weight Bearing Restrictions: Yes LLE Weight Bearing: Weight bearing as tolerated    Mobility  Bed Mobility Overal bed mobility: Needs Assistance Bed Mobility: Supine to Sit;Sit to Supine     Supine to sit: HOB elevated;Min assist Sit to supine: Min assist   General bed mobility comments: pt required increased time, use of bed rails and min A with movement of L LE  Transfers Overall transfer level: Needs assistance Equipment used: Rolling walker (2 wheeled) Transfers: Sit to/from Stand Sit to Stand: Min guard         General transfer comment: pt required increased time, good hand placement  Ambulation/Gait Ambulation/Gait assistance: Min guard Ambulation Distance (Feet): 100 Feet Assistive device: Rolling walker (2 wheeled) Gait Pattern/deviations: Step-to pattern;Decreased step length - right;Decreased stance time - left;Decreased  weight shift to left Gait velocity: decreased Gait velocity interpretation: Below normal speed for age/gender General Gait Details: pt demonstrating good, safe technique with RW   Stairs Stairs: Yes Stairs assistance: Min guard Stair Management: One rail Right;Two rails;Step to pattern;Forwards Number of Stairs: 2 (2 stairs x2 trials (B hand rails, unilateral handrail)) General stair comments: pt ascended with R LE leading and descended with L LE leading  Wheelchair Mobility    Modified Rankin (Stroke Patients Only)       Balance Overall balance assessment: Needs assistance Sitting-balance support: Feet supported;No upper extremity supported Sitting balance-Leahy Scale: Fair     Standing balance support: During functional activity;Single extremity supported Standing balance-Leahy Scale: Poor                      Cognition Arousal/Alertness: Awake/alert Behavior During Therapy: WFL for tasks assessed/performed Overall Cognitive Status: Within Functional Limits for tasks assessed                      Exercises      General Comments        Pertinent Vitals/Pain Pain Assessment: 0-10 Pain Score: 7  Faces Pain Scale: Hurts a little bit Pain Location: L knee Pain Descriptors / Indicators: Sore;Grimacing;Guarding Pain Intervention(s): Monitored during session;Repositioned    Home Living Family/patient expects to be discharged to:: Private residence Living Arrangements: Spouse/significant other;Other relatives Available Help at Discharge: Family Type of Home: House Home Access: Stairs to enter Entrance Stairs-Rails: Right Home Layout: Two level;Able to live on main level with bedroom/bathroom Home Equipment: Bedside commode;Walker - 2 wheels;Adaptive equipment;Tub bench      Prior Function  Level of Independence: Independent          PT Goals (current goals can now be found in the care plan section) Acute Rehab PT Goals Patient Stated Goal:  return home PT Goal Formulation: With patient/family Time For Goal Achievement: 06/02/16 Potential to Achieve Goals: Good Progress towards PT goals: Progressing toward goals    Frequency    7X/week      PT Plan Current plan remains appropriate    Co-evaluation   Reason for Co-Treatment: For patient/therapist safety   OT goals addressed during session: ADL's and self-care     End of Session Equipment Utilized During Treatment: Gait belt Activity Tolerance: Patient limited by fatigue;Patient limited by pain Patient left: in bed;with call bell/phone within reach     Time: [id number] PT Time Calculation (min) (ACUTE ONLY): 29 min  Charges:  $Gait Training: 23-37 mins                    G CodesClearnce Sorrel Aminta Sakurai 06-13-2016, 4:27 PM Sherie Don, [place], DPT [phone]

## 2016-05-19 NOTE — Op Note (Signed)
NAMEMarland Kitchen  GUISEPPE, LUMMIS NO.:  [id number]  MEDICAL RECORD NO.:  [id number]  LOCATION:  5N29C                        FACILITY:  [facility]  PHYSICIAN:  [name], M.D. DATE OF BIRTH:  October 28, 1955  DATE OF PROCEDURE:  05/18/2016 DATE OF DISCHARGE:                              OPERATIVE REPORT   PREOPERATIVE DIAGNOSES:  Left knee end-stage arthritis, primary generalized.  Marked contracture and varus deformity.  POSTOPERATIVE DIAGNOSES:  Left knee end-stage arthritis, primary generalized.  Marked contracture and varus deformity with moderate bone loss, medial compartment.  PROCEDURE:  Left knee modified minimally invasive total knee replacement with Stryker Triathlon prosthesis.  Cemented pegged posterior stabilized #7 femoral component.  A cemented #7 tibial component, 9 mm PS insert. Cemented resurfacing 38 mm patellar component.  SURGEON:  [name], M.D.  ASSISTANT:  [employer]. Mercie Eon., present throughout the entire case and necessary for timely completion of procedure.  ANESTHESIA:  Spinal.  BLOOD LOSS:  Minimal.  SPECIMENS:  None.  CULTURES:  None.  COMPLICATIONS:  None.  DRESSINGS:  Soft compressive.  TOURNIQUET TIME:  1 hour 15 minutes.  DESCRIPTION OF PROCEDURE:  The patient was brought to the operating room, and after adequate anesthesia had been obtained, left knee examined.  Motion 5 to barely 70 degrees.  Marked gradient crepitus, varus alignment.  Tourniquet applied.  Prepped and draped in the usual sterile fashion.  Exsanguinated with elevation of Esmarch, tourniquet inflated to 350 mmHg.  Straight incision above the patella down to tibial tubercle.  Medial arthrotomy, vastus splitting.  Exuberant spurs, adhesions throughout.  Flexible intramedullary guide, distal femur.  I ended up resecting 10 mm rather than 8.  After removing the spurs and using epicondylar axis, the femur was sized, cut, and fitted for posterior  stabilized pegged #7 component.  Extramedullary guide, proximal tibia 3-degree posterior slope cut.  A standard cut which did not give me enough room and the knee was still too tight, resected 2 more millimeters.  Patella exposed, worn and thin.  Resected the posterior 9 mm drill, sized, and fitted for a 38 mm component.  After all the bony resections, then I had revised both on the tibia and femur. I was at completion pleased with finally getting a balanced knee utilizing a 9 mm PS insert.  Rotation of the tibia with several trials. All trials removed.  Copious irrigation with pulse irrigating device. Cement prepared, placed on all components, firmly seated.  Polyethylene attached to tibia and patella with a clamp.  Once the cement hardened, the knee was irrigated again.  At completion, I had a great biomechanical axis, stable knee, nicely balanced, full extension, 130 degrees of flexion, good patellar tracking.  Soft tissues were injected with Exparel.  Arthrotomy closed with #1 Vicryl.  Subcutaneous with subcuticular closure.  Margins were injected with Marcaine.  Sterile compressive dressing applied.  Tourniquet deflated, removed.  Knee immobilizer applied.  Anesthesia reversed.  Brought to the recovery room.  Tolerated the surgery well.  No complications.     [name], M.D.     DFM/MEDQ  D:  05/18/2016  T:  05/19/2016  Job:  [id number]

## 2016-05-19 NOTE — Discharge Instructions (Signed)

## 2016-05-20 LAB — BASIC METABOLIC PANEL
Anion gap: 9 (ref 5–15)
BUN: 12 mg/dL (ref 6–20)
CALCIUM: 9.2 mg/dL (ref 8.9–10.3)
CO2: 26 mmol/L (ref 22–32)
CREATININE: 0.87 mg/dL (ref 0.61–1.24)
Chloride: 102 mmol/L (ref 101–111)
GFR calc non Af Amer: >60 mL/min (ref >60)
Glucose, Bld: 150 mg/dL — ABNORMAL HIGH (ref 65–99)
Potassium: 4.2 mmol/L (ref 3.5–5.1)
SODIUM: 137 mmol/L (ref 135–145)

## 2016-05-20 LAB — CBC
HCT: 39.6 % (ref 39.0–52.0)
Hemoglobin: 13.2 g/dL (ref 13.0–17.0)
MCH: 30.6 pg (ref 26.0–34.0)
MCHC: 33.3 g/dL (ref 30.0–36.0)
MCV: 91.7 fL (ref 78.0–100.0)
Platelets: 244 10*3/uL (ref 150–400)
RBC: 4.32 MIL/uL (ref 4.22–5.81)
RDW: 12.9 % (ref 11.5–15.5)
WBC: 14.6 10*3/uL — ABNORMAL HIGH (ref 4.0–10.5)

## 2016-05-20 LAB — GLUCOSE, CAPILLARY: Glucose-Capillary: 141 mg/dL — ABNORMAL HIGH (ref 65–99)

## 2016-05-20 NOTE — Progress Notes (Signed)
Occupational Therapy Treatment Patient Details Name: Jermaine James. MRN: [id number] DOB: February 24, 1956 Today's Date: 05/20/2016    History of present illness Pt is a 60 y/o male s/p L TKA. PMH including but not limited to gout, DM, HTN and obesity.   OT comments  Pt making progress towards OT goals. Today's session focused on AE education and practice to increase independence in ADL. Pt at adequate level to d/c with HHOT to ensure safety and intermittent supervision from wife (who is a Marine scientist).   Follow Up Recommendations  Home health OT;Supervision - Intermittent    Equipment Recommendations  Other (comment) (Pt has purchased AE kit from hospital)    Recommendations for Other Services      Precautions / Restrictions Precautions Precautions: Knee;Fall Precaution Booklet Issued: Yes (comment) Precaution Comments: PT reviewed positioning of LE following TKA with pt and his wife. Restrictions Weight Bearing Restrictions: Yes LLE Weight Bearing: Weight bearing as tolerated       Mobility Bed Mobility Overal bed mobility: Needs Assistance Bed Mobility: Supine to Sit     Supine to sit: HOB elevated;Min guard     General bed mobility comments: Pt OOB in recliner when OT entered the room  Transfers Overall transfer level: Needs assistance Equipment used: Rolling walker (2 wheeled) Transfers: Sit to/from Stand Sit to Stand: Min guard         General transfer comment: pt required increased time, good hand placement    Balance Overall balance assessment: Needs assistance Sitting-balance support: No upper extremity supported;Feet supported Sitting balance-Leahy Scale: Good     Standing balance support: During functional activity;Single extremity supported Standing balance-Leahy Scale: Poor                     ADL               Lower Body Bathing: Moderate assistance;With adaptive equipment;Sitting/lateral leans (Pt educated in use of long handle  sponge)   Upper Body Dressing :  (Pt fully dressed when OT entered the room)   Lower Body Dressing: Maximal assistance;With adaptive equipment;Cueing for sequencing;Sit to/from stand (Pt practiced with sock donner, reacher, long shoe horn)                 General ADL Comments: Pt practiced all tools in AE kit for maximizing independence in ADL      Vision                     Perception     Praxis      Cognition   Behavior During Therapy: [facility] for tasks assessed/performed Overall Cognitive Status: Within Functional Limits for tasks assessed                       Extremity/Trunk Assessment               Exercises Total Joint Exercises Ankle Circles/Pumps: AROM;Both;10 reps;Seated Quad Sets: AROM;Left;10 reps;Seated Straight Leg Raises: AROM;Strengthening;Left;5 reps;Seated Long Arc Quad: AROM;Strengthening;Left;10 reps;Seated Knee Flexion: AROM;AAROM;Left;10 reps;Seated Goniometric ROM: Flexion = 45 degrees; Extension = lacking 15 degrees to neutral; measured in sitting   Shoulder Instructions       General Comments      Pertinent Vitals/ Pain       Pain Assessment: No/denies pain Pain Intervention(s): Monitored during session  Home Living  Prior Functioning/Environment              Frequency  Min 2X/week        Progress Toward Goals  OT Goals(current goals can now be found in the care plan section)  Progress towards OT goals: Progressing toward goals  Acute Rehab OT Goals Patient Stated Goal: get back to walking without pain OT Goal Formulation: With patient Time For Goal Achievement: 05/26/16 Potential to Achieve Goals: Good  Plan Discharge plan remains appropriate    Co-evaluation                 End of Session Equipment Utilized During Treatment: Other (comment) (AE kit)   Activity Tolerance Patient tolerated treatment well   Patient Left in  chair;with call bell/phone within reach   Nurse Communication Mobility status        Time: 7902-4097 OT Time Calculation (min): 14 min  Charges: OT General Charges $OT Visit: 1 Procedure OT Treatments $Self Care/Home Management : 8-22 mins  Merri Ray Russel Morain 05/20/2016, 11:07 AM  [name] OTR/L [phone]

## 2016-05-20 NOTE — Progress Notes (Signed)
Subjective: 2 Days Post-Op Procedure(s) (LRB): LEFT TOTAL KNEE ARTHROPLASTY (Left) Patient reports pain as mild and moderate.    Objective: Vital signs in last 24 hours: Temp:  [98.1 F (36.7 C)-98.6 F (37 C)] 98.2 F (36.8 C) (10/06 0502) Pulse Rate:  [77-106] 77 (10/06 0502) Resp:  [17-18] 18 (10/06 0502) BP: (114-148)/(74-88) 114/74 (10/06 0502) SpO2:  [95 %-99 %] 96 % (10/06 0502)  Intake/Output from previous day: 10/05 0701 - 10/06 0700 In: 580 [P.O.:580] Out: 1250 [Urine:1250] Intake/Output this shift: No intake/output data recorded.   Recent Labs  05/19/16 0403 05/20/16 0531  HGB 13.3 13.2    Recent Labs  05/19/16 0403 05/20/16 0531  WBC 9.5 14.6*  RBC 4.38 4.32  HCT 40.8 39.6  PLT 258 244    Recent Labs  05/19/16 0403 05/20/16 0531  NA 136 137  K 3.7 4.2  CL 96* 102  CO2 27 26  BUN 9 12  CREATININE 0.82 0.87  GLUCOSE 164* 150*  CALCIUM 8.6* 9.2   No results for input(s): LABPT, INR in the last 72 hours.  Neurologically intact Neurovascular intact Sensation intact distally Dorsiflexion/Plantar flexion intact  Assessment/Plan: 2 Days Post-Op Procedure(s) (LRB): LEFT TOTAL KNEE ARTHROPLASTY (Left) Advance diet Discharge home with home health  [facility] 05/20/2016, 8:11 AM

## 2016-05-20 NOTE — Progress Notes (Signed)
Physical Therapy Treatment Patient Details Name: Jermaine James. MRN: [id number] DOB: 09/04/1955 Today's Date: 05/20/2016    History of Present Illness Pt is a 60 y/o male s/p L TKA. PMH including but not limited to gout, DM, HTN and obesity.    PT Comments    Pt presented supine in bed with HOB elevated, awake and willing to participate in therapy session. Pt again participating in stair training this session. PT reviewed HEP and precautions with pt and left handout. Pt would continue to benefit from skilled physical therapy services at this time while admitted and after d/c to address his limitations in order to improve his overall safety and independence with functional mobility.   Follow Up Recommendations  Home health PT;Supervision for mobility/OOB     Equipment Recommendations  None recommended by PT    Recommendations for Other Services       Precautions / Restrictions Precautions Precautions: Knee;Fall Precaution Booklet Issued: Yes (comment) Precaution Comments: PT reviewed positioning of LE following TKA with pt and his wife. Restrictions Weight Bearing Restrictions: Yes LLE Weight Bearing: Weight bearing as tolerated    Mobility  Bed Mobility Overal bed mobility: Needs Assistance Bed Mobility: Supine to Sit     Supine to sit: HOB elevated;Min guard     General bed mobility comments: pt required increased time and use of bed rails  Transfers Overall transfer level: Needs assistance Equipment used: Rolling walker (2 wheeled) Transfers: Sit to/from Stand Sit to Stand: Min guard         General transfer comment: pt required increased time, good hand placement  Ambulation/Gait Ambulation/Gait assistance: Min guard Ambulation Distance (Feet): 150 Feet (150' x2 with stair training in between) Assistive device: Rolling walker (2 wheeled) Gait Pattern/deviations: Step-through pattern;Decreased step length - right;Decreased stance time - left;Decreased  weight shift to left Gait velocity: decreased Gait velocity interpretation: Below normal speed for age/gender General Gait Details: pt demonstrating good, safe technique with RW; VC'ing for forward gaze   Stairs Stairs: Yes Stairs assistance: Min guard Stair Management: One rail Right;Step to pattern;Forwards Number of Stairs: 2 General stair comments: pt ascended with R LE leading and descended with L LE leading  Wheelchair Mobility    Modified Rankin (Stroke Patients Only)       Balance Overall balance assessment: Needs assistance Sitting-balance support: Feet supported;No upper extremity supported Sitting balance-Leahy Scale: Good     Standing balance support: During functional activity;Single extremity supported Standing balance-Leahy Scale: Poor                      Cognition Arousal/Alertness: Awake/alert Behavior During Therapy: WFL for tasks assessed/performed Overall Cognitive Status: Within Functional Limits for tasks assessed                      Exercises Total Joint Exercises Ankle Circles/Pumps: AROM;Both;10 reps;Seated Quad Sets: AROM;Left;10 reps;Seated Straight Leg Raises: AROM;Strengthening;Left;5 reps;Seated Long Arc Quad: AROM;Strengthening;Left;10 reps;Seated Knee Flexion: AROM;AAROM;Left;10 reps;Seated Goniometric ROM: Flexion = 45 degrees; Extension = lacking 15 degrees to neutral; measured in sitting    General Comments        Pertinent Vitals/Pain Pain Assessment: No/denies pain Pain Intervention(s): Monitored during session    Home Living                      Prior Function            PT Goals (current goals can now be found  in the care plan section) Acute Rehab PT Goals Patient Stated Goal: return home today PT Goal Formulation: With patient/family Time For Goal Achievement: 06/02/16 Potential to Achieve Goals: Good Progress towards PT goals: Progressing toward goals    Frequency     7X/week      PT Plan Current plan remains appropriate    Co-evaluation             End of Session Equipment Utilized During Treatment: Gait belt Activity Tolerance: Patient tolerated treatment well Patient left: in chair;with call bell/phone within reach     Time: 0810-0837 PT Time Calculation (min) (ACUTE ONLY): 27 min  Charges:  $Gait Training: 8-22 mins $Therapeutic Exercise: 8-22 mins                    G Codes:      Clearnce Sorrel Eli Pattillo June 05, 2016, 9:30 AM Sherie Don, PT, DPT [phone]

## 2016-05-30 ENCOUNTER — Other Ambulatory Visit: Payer: Self-pay | Admitting: Family Medicine

## 2016-07-15 DIAGNOSIS — M24562 Contracture, left knee: Secondary | ICD-10-CM

## 2016-07-15 HISTORY — DX: Contracture, left knee: M24.562

## 2016-07-20 ENCOUNTER — Other Ambulatory Visit: Payer: Self-pay | Admitting: Family Medicine

## 2016-08-11 NOTE — H&P (Signed)
Pleasant 60 year old gentleman who presents to our clinic today for a follow up of both knees.  In regards to his left knee, he is status post left total knee replacement 05-18-2016.  Doing well in regards to pain and strength.  However, he has not progressed as much as he would like in regards to motion.  He has somewhat plateaued in outpatient physical therapy.    In regards to his right knee, picture here of end-stage degenerative joint disease.  Treated with cortisone injections in the past, which have significantly helped up until a recent incident three weeks ago when he tripped over his dog, twisting his knee.  Since then he has had excruciating pain.  This is primarily the lateral aspect.  No mechanical symptoms.  He has tried Motrin with minimal relief of symptoms.  At this point, he is ready to proceed with definitive treatment with a right total knee replacement.  Past Medical, Family and Social History reviewed in detail on patient questionnaire and signed.   Review of systems as detailed on HPI; all others reviewed and are negative.    EXAMINATION: Well-developed, well-nourished male in no acute distress.  Alert and oriented x 3.  Lungs clear to auscultation bilaterally.  Heart sounds normal.  Examination of his left knee reveals range of motion from 3 to 88 degrees with a firm endpoint.  Stable to valgus and varus stress.  Neurovascularly intact distally.    Examination of his right knee reveals range of motion from 2 to 90 degrees.  Ligaments are stable.  Medial and lateral joint line tenderness.  Neurovascularly intact distally.  IMPRESSION: 1.  Right knee end-stage degenerative joint disease. 2.  Status post left total knee replacement with adhesion.  PLAN: In regards to the right knee, we are going to proceed with a right total knee replacement.  Discussed risks, benefits, and possible complications of surgery.  Rehab and recovery time discussed.  All questions were answered and  paperwork was completed.  However, we are going to go ahead and proceed with a 2:6 Depo-Medrol/Marcaine injection to the right knee today to give him some relief until his knee replacement is scheduled.  In regards to the left knee, we are going to proceed with a left knee manipulation under anesthesia.  Discussed risks, benefits, and possible complications of surgery.  Rehab and recovery time discussed.  All questions were answered and paperwork was completed.  PROCEDURE: The patient's clinical condition is marked by substantial pain and/or significant functional disability. Other conservative therapy has not provided relief, is contraindicated, or not appropriate. There is a reasonable likelihood that injection will significantly improve the patient's pain and/or functional disability.   After routine sterile prep of the knee with use of ethyl chloride and 1% plain Xylocaine through a medial parapatellar approach the knee is entered without difficulty. The knee is injected with 2:6 Depo-Medrol/Marcaine, accomplished atraumatically.  Patient tolerated the procedure well.

## 2016-08-12 ENCOUNTER — Encounter (HOSPITAL_BASED_OUTPATIENT_CLINIC_OR_DEPARTMENT_OTHER): Payer: Self-pay | Admitting: *Deleted

## 2016-08-12 NOTE — Pre-Procedure Instructions (Signed)
To come for BMET 

## 2016-08-16 ENCOUNTER — Encounter (HOSPITAL_BASED_OUTPATIENT_CLINIC_OR_DEPARTMENT_OTHER)
Admission: RE | Admit: 2016-08-16 | Discharge: 2016-08-16 | Disposition: A | Discharge status: Home / Self Care | Payer: BLUE CROSS/BLUE SHIELD | Source: Ambulatory Visit | Attending: Orthopedic Surgery | Admitting: Orthopedic Surgery

## 2016-08-16 DIAGNOSIS — G4730 Sleep apnea, unspecified: Secondary | ICD-10-CM | POA: Diagnosis not present

## 2016-08-16 DIAGNOSIS — M1990 Unspecified osteoarthritis, unspecified site: Secondary | ICD-10-CM | POA: Diagnosis not present

## 2016-08-16 DIAGNOSIS — E119 Type 2 diabetes mellitus without complications: Secondary | ICD-10-CM | POA: Diagnosis not present

## 2016-08-16 DIAGNOSIS — T8482XA Fibrosis due to internal orthopedic prosthetic devices, implants and grafts, initial encounter: Secondary | ICD-10-CM | POA: Diagnosis not present

## 2016-08-16 DIAGNOSIS — I10 Essential (primary) hypertension: Secondary | ICD-10-CM | POA: Diagnosis not present

## 2016-08-16 DIAGNOSIS — Z96652 Presence of left artificial knee joint: Secondary | ICD-10-CM | POA: Diagnosis present

## 2016-08-16 DIAGNOSIS — X58XXXA Exposure to other specified factors, initial encounter: Secondary | ICD-10-CM | POA: Diagnosis not present

## 2016-08-16 LAB — BASIC METABOLIC PANEL
ANION GAP: 7 (ref 5–15)
BUN: 16 mg/dL (ref 6–20)
CHLORIDE: 102 mmol/L (ref 101–111)
CO2: 27 mmol/L (ref 22–32)
Calcium: 9.3 mg/dL (ref 8.9–10.3)
Creatinine, Ser: 0.82 mg/dL (ref 0.61–1.24)
GFR calc non Af Amer: >60 mL/min (ref >60)
Glucose, Bld: 166 mg/dL — ABNORMAL HIGH (ref 65–99)
POTASSIUM: 3.8 mmol/L (ref 3.5–5.1)
Sodium: 136 mmol/L (ref 135–145)

## 2016-08-18 ENCOUNTER — Encounter (HOSPITAL_BASED_OUTPATIENT_CLINIC_OR_DEPARTMENT_OTHER): Payer: Self-pay

## 2016-08-18 ENCOUNTER — Encounter (HOSPITAL_BASED_OUTPATIENT_CLINIC_OR_DEPARTMENT_OTHER)
Admission: RE | Disposition: A | Discharge status: Home / Self Care | Payer: Self-pay | Source: Ambulatory Visit | Attending: Orthopedic Surgery

## 2016-08-18 ENCOUNTER — Ambulatory Visit (HOSPITAL_BASED_OUTPATIENT_CLINIC_OR_DEPARTMENT_OTHER): Payer: BLUE CROSS/BLUE SHIELD | Admitting: Anesthesiology

## 2016-08-18 ENCOUNTER — Ambulatory Visit (HOSPITAL_BASED_OUTPATIENT_CLINIC_OR_DEPARTMENT_OTHER)
Admission: RE | Admit: 2016-08-18 | Discharge: 2016-08-18 | Disposition: A | Discharge status: Home / Self Care | Payer: BLUE CROSS/BLUE SHIELD | Source: Ambulatory Visit | Attending: Orthopedic Surgery | Admitting: Orthopedic Surgery

## 2016-08-18 DIAGNOSIS — E119 Type 2 diabetes mellitus without complications: Secondary | ICD-10-CM | POA: Insufficient documentation

## 2016-08-18 DIAGNOSIS — M1990 Unspecified osteoarthritis, unspecified site: Secondary | ICD-10-CM | POA: Insufficient documentation

## 2016-08-18 DIAGNOSIS — T8482XA Fibrosis due to internal orthopedic prosthetic devices, implants and grafts, initial encounter: Secondary | ICD-10-CM | POA: Diagnosis not present

## 2016-08-18 DIAGNOSIS — G4730 Sleep apnea, unspecified: Secondary | ICD-10-CM | POA: Insufficient documentation

## 2016-08-18 DIAGNOSIS — I10 Essential (primary) hypertension: Secondary | ICD-10-CM | POA: Insufficient documentation

## 2016-08-18 DIAGNOSIS — X58XXXA Exposure to other specified factors, initial encounter: Secondary | ICD-10-CM | POA: Insufficient documentation

## 2016-08-18 DIAGNOSIS — Z96652 Presence of left artificial knee joint: Secondary | ICD-10-CM | POA: Insufficient documentation

## 2016-08-18 HISTORY — DX: Pure hyperglyceridemia: E78.1

## 2016-08-18 HISTORY — DX: Personal history of urinary calculi: Z87.442

## 2016-08-18 HISTORY — DX: Contracture, left knee: M24.562

## 2016-08-18 HISTORY — DX: Dental restoration status: Z98.811

## 2016-08-18 HISTORY — DX: Type 2 diabetes mellitus without complications: E11.9

## 2016-08-18 HISTORY — PX: KNEE CLOSED REDUCTION: SHX995

## 2016-08-18 LAB — GLUCOSE, CAPILLARY
Glucose-Capillary: 125 mg/dL — ABNORMAL HIGH (ref 65–99)
Glucose-Capillary: 161 mg/dL — ABNORMAL HIGH (ref 65–99)

## 2016-08-18 SURGERY — MANIPULATION, KNEE, CLOSED
Anesthesia: General | Site: Knee | Laterality: Left

## 2016-08-18 MED ORDER — LIDOCAINE 2% (20 MG/ML) 5 ML SYRINGE
INTRAMUSCULAR | Status: AC
  Filled 2016-08-18: qty 5

## 2016-08-18 MED ORDER — METHYLPREDNISOLONE ACETATE 80 MG/ML IJ SUSP
INTRAMUSCULAR | Status: DC | PRN
  Administered 2016-08-18: 80 mg

## 2016-08-18 MED ORDER — LIDOCAINE 2% (20 MG/ML) 5 ML SYRINGE
INTRAMUSCULAR | Status: DC | PRN
  Administered 2016-08-18: 60 mg via INTRAVENOUS

## 2016-08-18 MED ORDER — ONDANSETRON HCL 4 MG/2ML IJ SOLN
INTRAMUSCULAR | Status: DC | PRN
  Administered 2016-08-18: 4 mg via INTRAVENOUS

## 2016-08-18 MED ORDER — CEFAZOLIN SODIUM-DEXTROSE 2-4 GM/100ML-% IV SOLN
INTRAVENOUS | Status: AC
  Filled 2016-08-18: qty 100

## 2016-08-18 MED ORDER — FENTANYL CITRATE (PF) 100 MCG/2ML IJ SOLN
INTRAMUSCULAR | Status: AC
  Filled 2016-08-18: qty 2

## 2016-08-18 MED ORDER — SCOPOLAMINE 1 MG/3DAYS TD PT72: 1.0000 | MEDICATED_PATCH | Freq: Once | TRANSDERMAL | Status: DC | PRN

## 2016-08-18 MED ORDER — DEXAMETHASONE SODIUM PHOSPHATE 4 MG/ML IJ SOLN
INTRAMUSCULAR | Status: DC | PRN
  Administered 2016-08-18: 10 mg via INTRAVENOUS

## 2016-08-18 MED ORDER — OXYCODONE-ACETAMINOPHEN 5-325 MG PO TABS: 1.0000 | ORAL_TABLET | ORAL | 0 refills | Status: DC | PRN

## 2016-08-18 MED ORDER — FENTANYL CITRATE (PF) 100 MCG/2ML IJ SOLN
50.0000 ug | INTRAMUSCULAR | Status: DC | PRN
  Administered 2016-08-18 (×2): 100 ug via INTRAVENOUS

## 2016-08-18 MED ORDER — BUPIVACAINE HCL (PF) 0.5 % IJ SOLN
INTRAMUSCULAR | Status: DC | PRN
  Administered 2016-08-18: 20 mL

## 2016-08-18 MED ORDER — LACTATED RINGERS IV SOLN
INTRAVENOUS | Status: DC
  Administered 2016-08-18: 10:00:00 via INTRAVENOUS

## 2016-08-18 MED ORDER — FENTANYL CITRATE (PF) 100 MCG/2ML IJ SOLN
25.0000 ug | INTRAMUSCULAR | Status: DC | PRN
  Administered 2016-08-18 (×2): 50 ug via INTRAVENOUS

## 2016-08-18 MED ORDER — LACTATED RINGERS IV SOLN
INTRAVENOUS | Status: DC
Start: 2016-08-18 — End: 2016-08-18
  Administered 2016-08-18: 11:00:00 via INTRAVENOUS

## 2016-08-18 MED ORDER — ONDANSETRON HCL 4 MG/2ML IJ SOLN
INTRAMUSCULAR | Status: AC
  Filled 2016-08-18: qty 2

## 2016-08-18 MED ORDER — DEXAMETHASONE SODIUM PHOSPHATE 10 MG/ML IJ SOLN
INTRAMUSCULAR | Status: AC
  Filled 2016-08-18: qty 1

## 2016-08-18 MED ORDER — MIDAZOLAM HCL 2 MG/2ML IJ SOLN
1.0000 mg | INTRAMUSCULAR | Status: DC | PRN
  Administered 2016-08-18: 2 mg via INTRAVENOUS

## 2016-08-18 MED ORDER — MIDAZOLAM HCL 2 MG/2ML IJ SOLN
INTRAMUSCULAR | Status: AC
  Filled 2016-08-18: qty 2

## 2016-08-18 MED ORDER — HYDROMORPHONE HCL 1 MG/ML IJ SOLN: 0.2500 mg | INTRAMUSCULAR | Status: DC | PRN

## 2016-08-18 MED ORDER — PROPOFOL 10 MG/ML IV BOLUS
INTRAVENOUS | Status: DC | PRN
Start: 2016-08-18 — End: 2016-08-18
  Administered 2016-08-18: 250 mg via INTRAVENOUS

## 2016-08-18 MED ORDER — ONDANSETRON HCL 4 MG PO TABS: 4.0000 mg | ORAL_TABLET | Freq: Three times a day (TID) | ORAL | 0 refills | Status: DC | PRN

## 2016-08-18 SURGICAL SUPPLY — 22 items
BANDAGE ADH SHEER 1  50/CT (GAUZE/BANDAGES/DRESSINGS) ×3 IMPLANT
DECANTER SPIKE VIAL GLASS SM (MISCELLANEOUS) IMPLANT
GLOVE BIOGEL PI IND STRL 7.0 (GLOVE) IMPLANT
GLOVE BIOGEL PI IND STRL 8 (GLOVE) IMPLANT
GLOVE BIOGEL PI INDICATOR 7.0 (GLOVE)
GLOVE BIOGEL PI INDICATOR 8 (GLOVE)
GLOVE ECLIPSE 7.0 STRL STRAW (GLOVE) IMPLANT
GLOVE SURG ORTHO 8.0 STRL STRW (GLOVE) IMPLANT
GOWN STRL REUS W/ TWL LRG LVL3 (GOWN DISPOSABLE) IMPLANT
GOWN STRL REUS W/ TWL XL LVL3 (GOWN DISPOSABLE) IMPLANT
GOWN STRL REUS W/TWL LRG LVL3 (GOWN DISPOSABLE)
GOWN STRL REUS W/TWL XL LVL3 (GOWN DISPOSABLE)
KNEE WRAP E Z 3 GEL PACK (MISCELLANEOUS) IMPLANT
NDL SAFETY ECLIPSE 18X1.5 (NEEDLE) ×1 IMPLANT
NEEDLE HYPO 18GX1.5 SHARP (NEEDLE) ×3
NEEDLE HYPO 22GX1.5 SAFETY (NEEDLE) ×3 IMPLANT
NEEDLE SPNL 22GX3.5 QUINCKE BK (NEEDLE) IMPLANT
PAD ALCOHOL SWAB (MISCELLANEOUS) ×3 IMPLANT
SPONGE GAUZE 4X4 12PLY STER LF (GAUZE/BANDAGES/DRESSINGS) IMPLANT
SWABSTICK POVIDONE IODINE SNGL (MISCELLANEOUS) IMPLANT
SYR 20CC LL (SYRINGE) ×3 IMPLANT
SYR CONTROL 10ML LL (SYRINGE) IMPLANT

## 2016-08-18 NOTE — Progress Notes (Signed)
AssistedDr. Edwards with left, ultrasound guided, adductor canal block. Side rails up, monitors on throughout procedure. See vital signs in flow sheet. Tolerated Procedure well.  

## 2016-08-18 NOTE — Transfer of Care (Signed)
Immediate Anesthesia Transfer of Care Note  Patient: Jermaine James  Procedure(s) Performed: Procedure(s): LEFT  MANIPULATION KNEE (Left)  Patient Location: PACU  Anesthesia Type:General  Level of Consciousness: awake and sedated  Airway & Oxygen Therapy: Patient Spontanous Breathing and Patient connected to face mask oxygen  Post-op Assessment: Report given to RN and Post -op Vital signs reviewed and stable  Post vital signs: Reviewed and stable  Last Vitals:  Vitals:   08/18/16 1058 08/18/16 1059  BP:  127/64  Pulse: 89 90  Resp: (!) 23 18  Temp:      Last Pain:  Vitals:   08/18/16 1024  TempSrc: Oral  PainSc:          Complications: No apparent anesthesia complications

## 2016-08-18 NOTE — Discharge Instructions (Signed)
Weight bearing as tolerated.  Start physical therapy tomorrow.  Follow up appointment in one week.    Post Anesthesia Home Care Instructions  Activity: Get plenty of rest for the remainder of the day. A responsible adult should stay with you for 24 hours following the procedure.  For the next 24 hours, DO NOT: -Drive a car -Paediatric nurse -Drink alcoholic beverages -Take any medication unless instructed by your physician -Make any legal decisions or sign important papers.  Meals: Start with liquid foods such as gelatin or soup. Progress to regular foods as tolerated. Avoid greasy, spicy, heavy foods. If nausea and/or vomiting occur, drink only clear liquids until the nausea and/or vomiting subsides. Call your physician if vomiting continues.  Special Instructions/Symptoms: Your throat may feel dry or sore from the anesthesia or the breathing tube placed in your throat during surgery. If this causes discomfort, gargle with warm salt water. The discomfort should disappear within 24 hours.  If you had a scopolamine patch placed behind your ear for the management of post- operative nausea and/or vomiting:  1. The medication in the patch is effective for 72 hours, after which it should be removed.  Wrap patch in a tissue and discard in the trash. Wash hands thoroughly with soap and water. 2. You may remove the patch earlier than 72 hours if you experience unpleasant side effects which may include dry mouth, dizziness or visual disturbances. 3. Avoid touching the patch. Wash your hands with soap and water after contact with the patch.      Regional Anesthesia Blocks  1. Numbness or the inability to move the "blocked" extremity may last from 3-48 hours after placement. The length of time depends on the medication injected and your individual response to the medication. If the numbness is not going away after 48 hours, call your surgeon.  2. The extremity that is blocked will need to be  protected until the numbness is gone and the  Strength has returned. Because you cannot feel it, you will need to take extra care to avoid injury. Because it may be weak, you may have difficulty moving it or using it. You may not know what position it is in without looking at it while the block is in effect.  3. For blocks in the legs and feet, returning to weight bearing and walking needs to be done carefully. You will need to wait until the numbness is entirely gone and the strength has returned. You should be able to move your leg and foot normally before you try and bear weight or walk. You will need someone to be with you when you first try to ensure you do not fall and possibly risk injury.  4. Bruising and tenderness at the needle site are common side effects and will resolve in a few days.  5. Persistent numbness or new problems with movement should be communicated to the surgeon or the [place] [phone] [place] [phone]).

## 2016-08-18 NOTE — Anesthesia Postprocedure Evaluation (Signed)
Anesthesia Post Note  Patient: Jermaine James  Procedure(s) Performed: Procedure(s) (LRB): LEFT  MANIPULATION KNEE (Left)  Patient location during evaluation: PACU Anesthesia Type: General Level of consciousness: awake Pain management: pain level controlled Cardiovascular status: stable Anesthetic complications: no       Last Vitals:  Vitals:   08/18/16 1145 08/18/16 1200  BP: (!) 145/82 (!) 147/78  Pulse: 89 92  Resp: 16 16  Temp:  36.4 C    Last Pain:  Vitals:   08/18/16 1200  TempSrc:   PainSc: 3                  Tatum Massman

## 2016-08-18 NOTE — Anesthesia Procedure Notes (Signed)
Anesthesia Regional Block:  Adductor canal block  Pre-Anesthetic Checklist: ,, timeout performed, Correct Patient, Correct Site, Correct Laterality, Correct Procedure, Correct Position, site marked, Risks and benefits discussed, Surgical consent,  Pre-op evaluation,  Post-op pain management  Laterality: Left  Prep: chloraprep       Needles:  Injection technique: Single-shot  Needle Type: Stimulator Needle - 80          Additional Needles:  Procedures: Doppler guided and ultrasound guided (picture in chart) Adductor canal block Narrative:  Start time: 08/18/2016 10:40 AM End time: 08/18/2016 10:55 AM  Performed by: Personally  Anesthesiologist: Belinda Block

## 2016-08-18 NOTE — Interval H&P Note (Signed)
History and Physical Interval Note:  08/18/2016 10:44 AM  Jermaine James  has presented today for surgery, with the diagnosis of CONTRACTURE, LEFT KNEE  The various methods of treatment have been discussed with the patient and family. After consideration of risks, benefits and other options for treatment, the patient has consented to  Procedure(s): LEFT  MANIPULATION KNEE (Left) as a surgical intervention .  The patient's history has been reviewed, patient examined, no change in status, stable for surgery.  I have reviewed the patient's chart and labs.  Questions were answered to the patient's satisfaction.     [name]

## 2016-08-18 NOTE — Anesthesia Postprocedure Evaluation (Signed)
Anesthesia Post Note  Patient: Jermaine James  Procedure(s) Performed: Procedure(s) (LRB): LEFT  MANIPULATION KNEE (Left)  Patient location during evaluation: PACU Anesthesia Type: General Pain management: pain level controlled Vital Signs Assessment: post-procedure vital signs reviewed and stable Respiratory status: spontaneous breathing Cardiovascular status: stable Anesthetic complications: no       Last Vitals:  Vitals:   08/18/16 1145 08/18/16 1200  BP: (!) 145/82 (!) 147/78  Pulse: 89 92  Resp: 16 16  Temp:  36.4 C    Last Pain:  Vitals:   08/18/16 1200  TempSrc:   PainSc: 3                  Oluwanifemi Petitti

## 2016-08-18 NOTE — Anesthesia Procedure Notes (Signed)
Anesthesia Regional Block:  Adductor canal block  Pre-Anesthetic Checklist: ,, timeout performed, Correct Patient, Correct Site, Correct Laterality, Correct Procedure, Correct Position, site marked, Risks and benefits discussed,  Surgical consent,  Pre-op evaluation,  At surgeon's request and post-op pain management  Laterality: Left  Prep: chloraprep       Needles:   Needle Type: Stimulator Needle - 80          Additional Needles:  Procedures: Doppler guided and ultrasound guided (picture in chart) Adductor canal block Narrative:  Start time: 08/18/2016 10:40 AM End time: 08/18/2016 10:55 AM Injection made incrementally with aspirations every 5 mL. Anesthesiologist: Hans Rusher

## 2016-08-18 NOTE — Anesthesia Preprocedure Evaluation (Addendum)
Anesthesia Evaluation  Patient identified by MRN, date of birth, ID band Patient awake    Reviewed: Allergy & Precautions, NPO status , Patient's Chart, lab work & pertinent test results  Airway Mallampati: II  TM Distance: >3 FB     Dental   Pulmonary sleep apnea ,    breath sounds clear to auscultation       Cardiovascular hypertension,  Rhythm:Regular Rate:Normal     Neuro/Psych    GI/Hepatic negative GI ROS, Neg liver ROS,   Endo/Other  diabetes  Renal/GU negative Renal ROS     Musculoskeletal  (+) Arthritis ,   Abdominal   Peds  Hematology   Anesthesia Other Findings   Reproductive/Obstetrics                             Anesthesia Physical Anesthesia Plan  ASA: III  Anesthesia Plan: General   Post-op Pain Management:  Regional for Post-op pain   Induction: Intravenous  Airway Management Planned: LMA  Additional Equipment:   Intra-op Plan:   Post-operative Plan: Extubation in OR  Informed Consent: I have reviewed the patients History and Physical, chart, labs and discussed the procedure including the risks, benefits and alternatives for the proposed anesthesia with the patient or authorized representative who has indicated his/her understanding and acceptance.   Dental advisory given  Plan Discussed with: CRNA and Anesthesiologist  Anesthesia Plan Comments:        Anesthesia Quick Evaluation

## 2016-08-18 NOTE — Anesthesia Procedure Notes (Signed)
Procedure Name: LMA Insertion Performed by: Ellias Mcelreath W Pre-anesthesia Checklist: Patient identified, Emergency Drugs available, Suction available and Patient being monitored Patient Re-evaluated:Patient Re-evaluated prior to inductionOxygen Delivery Method: Circle system utilized Preoxygenation: Pre-oxygenation with 100% oxygen Intubation Type: IV induction Ventilation: Mask ventilation without difficulty LMA: LMA inserted LMA Size: 5.0 Number of attempts: 1 Placement Confirmation: positive ETCO2 Tube secured with: Tape Dental Injury: Teeth and Oropharynx as per pre-operative assessment        

## 2016-08-19 ENCOUNTER — Encounter (HOSPITAL_BASED_OUTPATIENT_CLINIC_OR_DEPARTMENT_OTHER): Payer: Self-pay | Admitting: Orthopedic Surgery

## 2016-08-19 NOTE — Op Note (Signed)
NAME:  Jermaine James, Jermaine James NO.:  [id number]  MEDICAL RECORD NO.:  [id number]  LOCATION:                                 FACILITY:  PHYSICIAN:  [name], M.D. 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  PROCEDURE:  Left knee exam, manipulation AND injection under anesthesia.  SURGEON:  [name], M.D.  ASSISTANT:  Elmyra Ricks, PA.  ANESTHESIA:  General.  DESCRIPTION OF PROCEDURE:  The patient was brought to the operating room and after adequate anesthesia had been obtained, left knee examined. Motion 5 to 90 relatively abrupt endpoints, better than it was preop, but still very stiff.  He was manipulated in both directions.  Able to achieve full extension, but he still very tight __________ about 5 degrees of flexion.  Able to go to 120 degrees of flexion, __________ adhesions and improving patellofemoral mobility.  No adverse recurrence. Injected under sterile technique intra-articularly with Depo-Medrol 80 mg and Marcaine.  Tolerated this well.  Discharged home.  Aggressive therapy.     [name], M.D.   ______________________________ [name], M.D.    DFM/MEDQ  D:  08/18/2016  T:  08/18/2016  Job:  [id number]

## 2016-08-23 NOTE — H&P (Signed)
TOTAL KNEE ADMISSION H&P  Patient is being admitted for right total knee arthroplasty.  Subjective:  Chief Complaint:right knee pain.  HPI: Jermaine James, 61 y.o. male, has a history of pain and functional disability in the bilaterally knee due to arthritis and has failed non-surgical conservative treatments for greater than 12 weeks to includeNSAID's and/or analgesics and corticosteriod injections.  Onset of symptoms was gradual, starting 5 years ago with gradually worsening course since that time. The patient noted no past surgery on the right knee(s).  Patient currently rates pain in the right knee(s) at 5 out of 10 with activity. Patient has night pain, worsening of pain with activity and weight bearing and crepitus.  Patient has evidence of subchondral sclerosis and joint space narrowing by imaging studies. There is no active infection.  Patient Active Problem List   Diagnosis Date Noted  . S/P total knee arthroplasty 05/18/2016  . OSA (obstructive sleep apnea) 07/03/2012  . Cellulitis 07/01/2012  . Primary osteoarthritis of left knee 10/28/2009  . MICROSCOPIC HEMATURIA 08/04/2009  . ACHILLES TENDINITIS 07/02/2008  . GOUT 10/02/2007  . PSA, INCREASED 08/13/2007  . Type 2 diabetes mellitus ([place]) 04/20/2007  . HYPERLIPIDEMIA 04/20/2007  . SKIN TAG 04/20/2007  . Essential hypertension 03/26/2007  . ALLERGIC RHINITIS 03/26/2007   Past Medical History:  Diagnosis Date  . 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 Dental crowns present   . Family history of adverse reaction to anesthesia    pt's father has hx. of post-op N/V  . High triglycerides    no current med.  Marland Kitchen History of kidney stones   . Hypertension    states under control with med., has been on med. ~ 61 yr.  . Non-insulin dependent type 2 diabetes mellitus ([place])   . Obesity, Class III, BMI 40-49.9 (morbid obesity) ([place])   . Osteoarthritis    knees  . Sleep apnea     uses CPAP nightly    Past Surgical History:  Procedure Laterality Date  . CYSTOSCOPY  2011  . EYE SURGERY Left 1961   traumatic loss of eye  . INGUINAL HERNIA REPAIR Left   . KNEE ARTHROSCOPY Left   . KNEE ARTHROSCOPY WITH MEDIAL MENISECTOMY Right 11/08/2012   Procedure: RIGHT KNEE ARTHROSCOPY WITH PARTIAL MEDIAL AND LATERAL MENISECTOMIES, CHONDROPLASTY;  Surgeon: [name], MD;  Location: [facility];  Service: Orthopedics;  Laterality: Right;  RIGHT KNEE SCOPE WITH PARTIAL MEDIAL AND LATERAL  MENISCECTOMIES  AND CHONDROPLASTY  . KNEE CLOSED REDUCTION Left 08/18/2016   Procedure: LEFT  MANIPULATION KNEE;  Surgeon: [name], MD;  Location: [facility];  Service: Orthopedics;  Laterality: Left;  . LIPOMA EXCISION Left    upper back  . TOTAL KNEE ARTHROPLASTY Left 05/18/2016   Procedure: LEFT TOTAL KNEE ARTHROPLASTY;  Surgeon: [name], MD;  Location: [facility];  Service: Orthopedics;  Laterality: Left;    No prescriptions prior to admission.   No Known Allergies  Social History  Substance Use Topics  . Smoking status: Never Smoker  . Smokeless tobacco: Never Used  . Alcohol use 0.6 - 1.2 oz/week    1 - 2 Standard drinks or equivalent per week     Comment: 1-2 drinks/week    Family History  Problem Relation Age of Onset  . Anesthesia problems Father     post-op N/V  Review of Systems  Constitutional: Negative.   HENT: Negative.   Eyes: Negative.   Respiratory: Negative.   Cardiovascular: Negative.   Gastrointestinal: Negative.   Genitourinary: Negative.   Musculoskeletal: Positive for joint pain.  Skin: Negative.   Neurological: Negative.   Endo/Heme/Allergies: Negative.   Psychiatric/Behavioral: Negative.     Objective:  Physical Exam  Constitutional: He is oriented to person, place, and time. He appears well-developed and well-nourished.  HENT:  Head: Normocephalic and atraumatic.  Eyes: EOM are normal. Pupils are  equal, round, and reactive to light.  Neck: Normal range of motion. Neck supple.  Cardiovascular: Normal rate and regular rhythm.   Respiratory: Effort normal and breath sounds normal.  GI: Soft. Bowel sounds are normal.  Musculoskeletal:  Examination of his right knee reveals range of motion from 2 to 90 degrees.  Ligaments are stable.  Medial and lateral joint line tenderness.  Neurovascularly intact distally.    Neurological: He is alert and oriented to person, place, and time.  Skin: Skin is warm and dry.  Psychiatric: He has a normal mood and affect. His behavior is normal. Judgment and thought content normal.    Vital signs in last 24 hours:    Labs:   Estimated body mass index is 40.6 kg/m as calculated from the following:   Height as of 08/18/16: 6' (1.829 m).   Weight as of 08/18/16: 135.8 kg (299 lb 6 oz).   Imaging Review Plain radiographs demonstrate severe degenerative joint disease of the right knee(s). The overall alignment isneutral. The bone quality appears to be fair for age and reported activity level.  Assessment/Plan:  End stage arthritis, right knee   The patient history, physical examination, clinical judgment of the provider and imaging studies are consistent with end stage degenerative joint disease of the right knee(s) and total knee arthroplasty is deemed medically necessary. The treatment options including medical management, injection therapy arthroscopy and arthroplasty were discussed at length. The risks and benefits of total knee arthroplasty were presented and reviewed. The risks due to aseptic loosening, infection, stiffness, patella tracking problems, thromboembolic complications and other imponderables were discussed. The patient acknowledged the explanation, agreed to proceed with the plan and consent was signed. Patient is being admitted for inpatient treatment for surgery, pain control, PT, OT, prophylactic antibiotics, VTE prophylaxis, progressive  ambulation and ADL's and discharge planning. The patient is planning to be discharged home with home health services

## 2016-08-29 NOTE — H&P (Signed)
TOTAL KNEE ADMISSION H&P  Patient is being admitted for right total knee arthroplasty.  Subjective:  Chief Complaint:right knee pain.  HPI: Jermaine James, 61 y.o. male, has a history of pain and functional disability in the right knee due to arthritis and has failed non-surgical conservative treatments for greater than 12 weeks to includeNSAID's and/or analgesics, corticosteriod injections and activity modification.  Onset of symptoms was gradual, starting 4 years ago with rapidlly worsening course since that time. The patient noted no past surgery on the right knee(s).  Patient currently rates pain in the right knee(s) at 3 out of 10 with activity. Patient has night pain, crepitus and joint swelling.  Patient has evidence of subchondral sclerosis and joint space narrowing by imaging studies. There is no active infection.  Patient Active Problem List   Diagnosis Date Noted  . S/P total knee arthroplasty 05/18/2016  . OSA (obstructive sleep apnea) 07/03/2012  . Cellulitis 07/01/2012  . Primary osteoarthritis of left knee 10/28/2009  . MICROSCOPIC HEMATURIA 08/04/2009  . ACHILLES TENDINITIS 07/02/2008  . GOUT 10/02/2007  . PSA, INCREASED 08/13/2007  . Type 2 diabetes mellitus ([place]) 04/20/2007  . HYPERLIPIDEMIA 04/20/2007  . SKIN TAG 04/20/2007  . Essential hypertension 03/26/2007  . ALLERGIC RHINITIS 03/26/2007   Past Medical History:  Diagnosis Date  . 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 Dental crowns present   . Family history of adverse reaction to anesthesia    pt's father has hx. of post-op N/V  . High triglycerides    no current med.  Marland Kitchen History of kidney stones   . Hypertension    states under control with med., has been on med. ~ 60 yr.  . Non-insulin dependent type 2 diabetes mellitus ([place])   . Obesity, Class III, BMI 40-49.9 (morbid obesity) ([place])   . Osteoarthritis    knees  . Sleep apnea    uses CPAP nightly     Past Surgical History:  Procedure Laterality Date  . CYSTOSCOPY  2011  . EYE SURGERY Left 1961   traumatic loss of eye  . INGUINAL HERNIA REPAIR Left   . KNEE ARTHROSCOPY Left   . KNEE ARTHROSCOPY WITH MEDIAL MENISECTOMY Right 11/08/2012   Procedure: RIGHT KNEE ARTHROSCOPY WITH PARTIAL MEDIAL AND LATERAL MENISECTOMIES, CHONDROPLASTY;  Surgeon: [name], MD;  Location: [facility];  Service: Orthopedics;  Laterality: Right;  RIGHT KNEE SCOPE WITH PARTIAL MEDIAL AND LATERAL  MENISCECTOMIES  AND CHONDROPLASTY  . KNEE CLOSED REDUCTION Left 08/18/2016   Procedure: LEFT  MANIPULATION KNEE;  Surgeon: [name], MD;  Location: [facility];  Service: Orthopedics;  Laterality: Left;  . LIPOMA EXCISION Left    upper back  . TOTAL KNEE ARTHROPLASTY Left 05/18/2016   Procedure: LEFT TOTAL KNEE ARTHROPLASTY;  Surgeon: [name], MD;  Location: [facility];  Service: Orthopedics;  Laterality: Left;    No prescriptions prior to admission.   No Known Allergies  Social History  Substance Use Topics  . Smoking status: Never Smoker  . Smokeless tobacco: Never Used  . Alcohol use 0.6 - 1.2 oz/week    1 - 2 Standard drinks or equivalent per week     Comment: 1-2 drinks/week    Family History  Problem Relation Age of Onset  . Anesthesia problems Father     post-op N/V     Review of Systems  Constitutional: Negative.   HENT: Positive for congestion and sinus pain.   Eyes: Negative.   Respiratory: Positive for cough.   Cardiovascular: Negative.   Gastrointestinal: Negative.   Genitourinary: Negative.   Musculoskeletal: Positive for joint pain.  Skin: Negative.   Neurological: Negative.   Endo/Heme/Allergies: Negative.   Psychiatric/Behavioral: Negative.     Objective:  Physical Exam  Constitutional: He appears well-developed and well-nourished.  HENT:  Head: Normocephalic and atraumatic.  Eyes: EOM are normal. Pupils are equal, round, and  reactive to light.  Neck: Normal range of motion. Neck supple.  Cardiovascular: Normal rate and regular rhythm.   Respiratory: Effort normal and breath sounds normal.  GI: Bowel sounds are normal.  Musculoskeletal:  Examination of his right knee reveals range of motion from 2 to 90 degrees.  Ligaments are stable.  Medial and lateral joint line tenderness.  Neurovascularly intact distally.    Vital signs in last 24 hours:    Labs:   Estimated body mass index is 40.6 kg/m as calculated from the following:   Height as of 08/18/16: 6' (1.829 m).   Weight as of 08/18/16: 135.8 kg (299 lb 6 oz).   Imaging Review Plain radiographs demonstrate severe degenerative joint disease of the right knee(s). The overall alignment ismild varus. The bone quality appears to be fair for age and reported activity level.  Assessment/Plan:  End stage arthritis, right knee   The patient history, physical examination, clinical judgment of the provider and imaging studies are consistent with end stage degenerative joint disease of the right knee(s) and total knee arthroplasty is deemed medically necessary. The treatment options including medical management, injection therapy arthroscopy and arthroplasty were discussed at length. The risks and benefits of total knee arthroplasty were presented and reviewed. The risks due to aseptic loosening, infection, stiffness, patella tracking problems, thromboembolic complications and other imponderables were discussed. The patient acknowledged the explanation, agreed to proceed with the plan and consent was signed. Patient is being admitted for inpatient treatment for surgery, pain control, PT, OT, prophylactic antibiotics, VTE prophylaxis, progressive ambulation and ADL's and discharge planning. The patient is planning to be discharged home with home health services

## 2016-09-01 ENCOUNTER — Other Ambulatory Visit (HOSPITAL_COMMUNITY): Payer: Self-pay | Admitting: *Deleted

## 2016-09-02 ENCOUNTER — Encounter (HOSPITAL_COMMUNITY): Payer: Self-pay

## 2016-09-02 ENCOUNTER — Encounter (HOSPITAL_COMMUNITY)
Admission: RE | Admit: 2016-09-02 | Discharge: 2016-09-02 | Disposition: A | Discharge status: Home / Self Care | Payer: BLUE CROSS/BLUE SHIELD | Source: Ambulatory Visit | Attending: Orthopedic Surgery | Admitting: Orthopedic Surgery

## 2016-09-02 ENCOUNTER — Other Ambulatory Visit (HOSPITAL_COMMUNITY): Payer: Self-pay | Admitting: *Deleted

## 2016-09-02 DIAGNOSIS — Z01812 Encounter for preprocedural laboratory examination: Secondary | ICD-10-CM | POA: Insufficient documentation

## 2016-09-02 DIAGNOSIS — M1711 Unilateral primary osteoarthritis, right knee: Secondary | ICD-10-CM | POA: Diagnosis not present

## 2016-09-02 HISTORY — DX: Blindness, one eye, unspecified eye: H54.40

## 2016-09-02 LAB — BASIC METABOLIC PANEL
Anion gap: 10 (ref 5–15)
BUN: 14 mg/dL (ref 6–20)
CALCIUM: 9.5 mg/dL (ref 8.9–10.3)
CO2: 23 mmol/L (ref 22–32)
CREATININE: 0.75 mg/dL (ref 0.61–1.24)
Chloride: 105 mmol/L (ref 101–111)
GFR calc Af Amer: >60 mL/min (ref >60)
GLUCOSE: 128 mg/dL — AB (ref 65–99)
Potassium: 3.7 mmol/L (ref 3.5–5.1)
SODIUM: 138 mmol/L (ref 135–145)

## 2016-09-02 LAB — CBC
HEMATOCRIT: 44.2 % (ref 39.0–52.0)
Hemoglobin: 14.6 g/dL (ref 13.0–17.0)
MCH: 29.9 pg (ref 26.0–34.0)
MCHC: 33 g/dL (ref 30.0–36.0)
MCV: 90.4 fL (ref 78.0–100.0)
PLATELETS: 233 10*3/uL (ref 150–400)
RBC: 4.89 MIL/uL (ref 4.22–5.81)
RDW: 13 % (ref 11.5–15.5)
WBC: 8.2 10*3/uL (ref 4.0–10.5)

## 2016-09-02 LAB — TYPE AND SCREEN
ABO/RH(D): A POS
Antibody Screen: NEGATIVE

## 2016-09-02 LAB — SURGICAL PCR SCREEN
MRSA, PCR: NEGATIVE
STAPHYLOCOCCUS AUREUS: NEGATIVE

## 2016-09-02 LAB — GLUCOSE, CAPILLARY: Glucose-Capillary: 127 mg/dL — ABNORMAL HIGH (ref 65–99)

## 2016-09-02 NOTE — Pre-Procedure Instructions (Signed)
Jermaine James  09/02/2016      [facility] [address] [place] [address] [place] [address] Phone: [phone] Fax: [phone]    Your procedure is scheduled on 09-14-2016  Wednesday .  Report to [facility] Admitting at 8:00 A.M.   Call this number if you have problems the morning of surgery:  [phone]   Remember:  Do not eat food or drink liquids after midnight .  Take these medicines the morning of surgery with A SIP OF WATER  None                   How to Manage Your Diabetes Before and After Surgery  Why is it important to control my blood sugar before and after surgery? . Improving blood sugar levels before and after surgery helps healing and can limit problems. . A way of improving blood sugar control is eating a healthy diet by: o  Eating less sugar and carbohydrates o  Increasing activity/exercise o  Talking with your doctor about reaching your blood sugar goals . High blood sugars (greater than 180 mg/dL) can raise your risk of infections and slow your recovery, so you will need to focus on controlling your diabetes during the weeks before surgery. . Make sure that the doctor who takes care of your diabetes knows about your planned surgery including the date and location.  How do I manage my blood sugar before surgery? . Check your blood sugar at least 4 times a day, starting 2 days before surgery, to make sure that the level is not too high or low. o Check your blood sugar the morning of your surgery when you wake up and every 2 hours until you get to the Short Stay unit. . If your blood sugar is less than 70 mg/dL, you will need to treat for low blood sugar: o Do not take insulin. o Treat a low blood sugar (less than 70 mg/dL) with  cup of clear juice (cranberry or apple), 4 glucose tablets, OR glucose gel. o Recheck blood sugar in 15 minutes after treatment (to make sure it is  greater than 70 mg/dL). If your blood sugar is not greater than 70 mg/dL on recheck, call [phone] for further instructions. . Report your blood sugar to the short stay nurse when you get to Short Stay.  . If you are admitted to the hospital after surgery: o Your blood sugar will be checked by the staff and you will probably be given insulin after surgery (instead of oral diabetes medicines) to make sure you have good blood sugar levels. o The goal for blood sugar control after surgery is 80-180 mg/dL.              WHAT DO I DO ABOUT MY DIABETES MEDICATION?   Marland Kitchen Do not take oral diabetes medicines (pills) the morning of surgery.                          STOP ASPIRIN,ANTIINFLAMATORIES (IBUPROFEN,ALEVE,MOTRIN,ADVIL,GOODY'S POWDERS),HERBAL SUPPLEMENTS,FISH OIL,AND VITAMINS 5-7 DAYS PRIOR TO SURGERY   Do not wear jewelry  Do not wear lotions, powders, or perfumes, or deoderant.  Do not shave 48 hours prior to surgery.  Men may shave face and neck.   Do not bring valuables to the hospital.  [facility] is not responsible for any belongings or valuables.  Contacts, dentures  or bridgework may not be worn into surgery.  Leave your suitcase in the car.  After surgery it may be brought to your room.  For patients admitted to the hospital, discharge time will be determined by your treatment team.  Patients discharged the day of surgery will not be allowed to drive home.    Special instructions:  See attached Sheet for instructions on [employer]

## 2016-09-03 LAB — HEMOGLOBIN A1C
HEMOGLOBIN A1C: 7 % — AB (ref 4.8–5.6)
Mean Plasma Glucose: 154 mg/dL

## 2016-09-06 ENCOUNTER — Ambulatory Visit (INDEPENDENT_AMBULATORY_CARE_PROVIDER_SITE_OTHER): Payer: BLUE CROSS/BLUE SHIELD | Admitting: Family Medicine

## 2016-09-06 VITALS — BP 126/74 | HR 111 | Temp 98.5°F | Ht 72.0 in | Wt 304.0 lb

## 2016-09-06 DIAGNOSIS — J018 Other acute sinusitis: Secondary | ICD-10-CM | POA: Diagnosis not present

## 2016-09-06 MED ORDER — AMOXICILLIN-POT CLAVULANATE 875-125 MG PO TABS: 1.0000 | ORAL_TABLET | Freq: Two times a day (BID) | ORAL | 0 refills | Status: DC

## 2016-09-06 NOTE — Progress Notes (Signed)
Pre visit review using our clinic review tool, if applicable. No additional management support is needed unless otherwise documented below in the visit note. 

## 2016-09-07 ENCOUNTER — Encounter: Payer: Self-pay | Admitting: Family Medicine

## 2016-09-07 NOTE — Progress Notes (Signed)
   Subjective:    Patient ID: Jermaine James, male    DOB: 1955/10/02, 61 y.o.   MRN: [id number]  HPI Here for one week of sinus pressure, PND, and blowing green mucus from the nose. No fever or cough.    Review of Systems  Constitutional: Negative.   HENT: Positive for congestion, postnasal drip, sinus pain and sinus pressure. Negative for sore throat.   Eyes: Negative.   Respiratory: Negative.        Objective:   Physical Exam  Constitutional: He appears well-developed and well-nourished.  HENT:  Right Ear: External ear normal.  Left Ear: External ear normal.  Nose: Nose normal.  Mouth/Throat: Oropharynx is clear and moist.  Eyes: Conjunctivae are normal.  Neck: No thyromegaly present.  Pulmonary/Chest: Effort normal and breath sounds normal.  Lymphadenopathy:    He has no cervical adenopathy.          Assessment & Plan:  Sinusitis, treat with Augmentin. Add Mucinex.  Alysia Penna, MD

## 2016-09-13 ENCOUNTER — Encounter (HOSPITAL_COMMUNITY): Payer: Self-pay | Admitting: Anesthesiology

## 2016-09-13 MED ORDER — TRANEXAMIC ACID 1000 MG/10ML IV SOLN
1000.0000 mg | INTRAVENOUS | Status: AC
  Administered 2016-09-14: 1000 mg via INTRAVENOUS
  Filled 2016-09-13: qty 10

## 2016-09-13 MED ORDER — DEXTROSE 5 % IV SOLN
3.0000 g | INTRAVENOUS | Status: AC
  Administered 2016-09-14: 2 g via INTRAVENOUS
  Filled 2016-09-13: qty 3000

## 2016-09-13 NOTE — Anesthesia Preprocedure Evaluation (Addendum)
Anesthesia Evaluation    History of Anesthesia Complications (+) Family history of anesthesia reaction and history of anesthetic complications (father with PONV)  Airway Mallampati: III  TM Distance: >3 FB Neck ROM: Full   Comment: Short, thick neck  Dental  (+) Teeth Intact, Dental Advisory Given, Caps   Pulmonary neg shortness of breath, sleep apnea and Continuous Positive Airway Pressure Ventilation , neg COPD, Recent URI  (dry cough), Residual Cough,  Finished Augmentin yesterday   Pulmonary exam normal breath sounds clear to auscultation       Cardiovascular hypertension, Pt. on medications (-) angina(-) Past MI, (-) Cardiac Stents, (-) Orthopnea, (-) PND and (-) DOE Normal cardiovascular exam(-) dysrhythmias  Rhythm:Regular Rate:Normal  HLD  EKG 05/06/2016: NSR   Neuro/Psych negative neurological ROS     GI/Hepatic negative GI ROS, Neg liver ROS,   Endo/Other  diabetes, Well Controlled, Type 2, Oral Hypoglycemic AgentsMorbid obesityHyperlipidemia  Renal/GU Renal disease (h/o nephrolithiasis)     Musculoskeletal  (+) Arthritis , Osteoarthritis,    Abdominal (+) + obese,   Peds  Hematology negative hematology ROS (+)   Anesthesia Other Findings Gout, blind in left eye (artificial eye)  Reproductive/Obstetrics                              Chemistry      Component Value Date/Time   NA 138 09/02/2016 1031   K 3.7 09/02/2016 1031   CL 105 09/02/2016 1031   CO2 23 09/02/2016 1031   BUN 14 09/02/2016 1031   CREATININE 0.75 09/02/2016 1031      Component Value Date/Time   CALCIUM 9.5 09/02/2016 1031   ALKPHOS 47 05/06/2016 1026   AST 22 05/06/2016 1026   ALT 26 05/06/2016 1026   BILITOT 0.4 05/06/2016 1026     Lab Results  Component Value Date   WBC 8.2 09/02/2016   HGB 14.6 09/02/2016   HCT 44.2 09/02/2016   MCV 90.4 09/02/2016   PLT 233 09/02/2016   EKG: normal EKG, normal  sinus rhythm.  Anesthesia Physical  Anesthesia Plan  ASA: III  Anesthesia Plan: Spinal and Regional   Post-op Pain Management:    Induction: Intravenous  Airway Management Planned: Natural Airway and Simple Face Mask  Additional Equipment:   Intra-op Plan:   Post-operative Plan:   Informed Consent: I have reviewed the patients History and Physical, chart, labs and discussed the procedure including the risks, benefits and alternatives for the proposed anesthesia with the patient or authorized representative who has indicated his/her understanding and acceptance.   Dental advisory given  Plan Discussed with: CRNA, Surgeon and Anesthesiologist  Anesthesia Plan Comments:        Anesthesia Quick Evaluation

## 2016-09-14 ENCOUNTER — Encounter (HOSPITAL_COMMUNITY): Payer: Self-pay | Admitting: *Deleted

## 2016-09-14 ENCOUNTER — Encounter (HOSPITAL_COMMUNITY)
Admission: RE | Disposition: A | Discharge status: Home / Self Care | Payer: Self-pay | Source: Ambulatory Visit | Attending: Orthopedic Surgery

## 2016-09-14 ENCOUNTER — Inpatient Hospital Stay (HOSPITAL_COMMUNITY): Payer: BLUE CROSS/BLUE SHIELD

## 2016-09-14 ENCOUNTER — Inpatient Hospital Stay (HOSPITAL_COMMUNITY): Payer: BLUE CROSS/BLUE SHIELD | Admitting: Certified Registered"

## 2016-09-14 ENCOUNTER — Observation Stay (HOSPITAL_COMMUNITY)
Admission: RE | Admit: 2016-09-14 | Discharge: 2016-09-15 | Disposition: A | Discharge status: Home / Self Care | Payer: BLUE CROSS/BLUE SHIELD | Source: Ambulatory Visit | Attending: Orthopedic Surgery | Admitting: Orthopedic Surgery

## 2016-09-14 DIAGNOSIS — M24561 Contracture, right knee: Secondary | ICD-10-CM | POA: Diagnosis not present

## 2016-09-14 DIAGNOSIS — M170 Bilateral primary osteoarthritis of knee: Secondary | ICD-10-CM | POA: Diagnosis not present

## 2016-09-14 DIAGNOSIS — E119 Type 2 diabetes mellitus without complications: Secondary | ICD-10-CM | POA: Insufficient documentation

## 2016-09-14 DIAGNOSIS — H5440 Blindness, one eye, unspecified eye: Secondary | ICD-10-CM | POA: Diagnosis not present

## 2016-09-14 DIAGNOSIS — E785 Hyperlipidemia, unspecified: Secondary | ICD-10-CM | POA: Insufficient documentation

## 2016-09-14 DIAGNOSIS — M24662 Ankylosis, left knee: Secondary | ICD-10-CM | POA: Insufficient documentation

## 2016-09-14 DIAGNOSIS — M109 Gout, unspecified: Secondary | ICD-10-CM | POA: Insufficient documentation

## 2016-09-14 DIAGNOSIS — Z6841 Body Mass Index (BMI) 40.0 and over, adult: Secondary | ICD-10-CM | POA: Diagnosis not present

## 2016-09-14 DIAGNOSIS — Z85828 Personal history of other malignant neoplasm of skin: Secondary | ICD-10-CM | POA: Diagnosis not present

## 2016-09-14 DIAGNOSIS — Z96652 Presence of left artificial knee joint: Secondary | ICD-10-CM | POA: Insufficient documentation

## 2016-09-14 DIAGNOSIS — Z87442 Personal history of urinary calculi: Secondary | ICD-10-CM | POA: Insufficient documentation

## 2016-09-14 DIAGNOSIS — G4733 Obstructive sleep apnea (adult) (pediatric): Secondary | ICD-10-CM | POA: Diagnosis not present

## 2016-09-14 DIAGNOSIS — Z96659 Presence of unspecified artificial knee joint: Secondary | ICD-10-CM

## 2016-09-14 DIAGNOSIS — Z7984 Long term (current) use of oral hypoglycemic drugs: Secondary | ICD-10-CM | POA: Diagnosis not present

## 2016-09-14 DIAGNOSIS — M1711 Unilateral primary osteoarthritis, right knee: Secondary | ICD-10-CM | POA: Diagnosis present

## 2016-09-14 DIAGNOSIS — M25661 Stiffness of right knee, not elsewhere classified: Secondary | ICD-10-CM

## 2016-09-14 DIAGNOSIS — R262 Difficulty in walking, not elsewhere classified: Secondary | ICD-10-CM

## 2016-09-14 DIAGNOSIS — I10 Essential (primary) hypertension: Secondary | ICD-10-CM | POA: Insufficient documentation

## 2016-09-14 HISTORY — PX: MANIPULATION KNEE JOINT: SUR841

## 2016-09-14 HISTORY — PX: TOTAL KNEE ARTHROPLASTY: SHX125

## 2016-09-14 HISTORY — DX: Squamous cell carcinoma of skin of other parts of face: C44.329

## 2016-09-14 HISTORY — DX: Personal history of other diseases of the musculoskeletal system and connective tissue: Z87.39

## 2016-09-14 HISTORY — DX: Obstructive sleep apnea (adult) (pediatric): G47.33

## 2016-09-14 HISTORY — DX: Dependence on other enabling machines and devices: Z99.89

## 2016-09-14 LAB — GLUCOSE, CAPILLARY
GLUCOSE-CAPILLARY: 133 mg/dL — AB (ref 65–99)
GLUCOSE-CAPILLARY: 95 mg/dL (ref 65–99)
GLUCOSE-CAPILLARY: 118 mg/dL — AB (ref 65–99)
GLUCOSE-CAPILLARY: 160 mg/dL — AB (ref 65–99)

## 2016-09-14 SURGERY — ARTHROPLASTY, KNEE, TOTAL
Anesthesia: Spinal | Laterality: Right

## 2016-09-14 MED ORDER — ONDANSETRON HCL 4 MG/2ML IJ SOLN
INTRAMUSCULAR | Status: DC | PRN
Start: 2016-09-14 — End: 2016-09-14
  Administered 2016-09-14: 4 mg via INTRAVENOUS

## 2016-09-14 MED ORDER — MENTHOL 3 MG MT LOZG: 1.0000 | LOZENGE | OROMUCOSAL | Status: DC | PRN

## 2016-09-14 MED ORDER — LACTATED RINGERS IV SOLN: INTRAVENOUS | Status: DC

## 2016-09-14 MED ORDER — BUPIVACAINE HCL 0.5 % IJ SOLN
INTRAMUSCULAR | Status: DC | PRN
  Administered 2016-09-14: 30 mL

## 2016-09-14 MED ORDER — LISINOPRIL-HYDROCHLOROTHIAZIDE 10-12.5 MG PO TABS: 1.0000 | ORAL_TABLET | Freq: Every day | ORAL | Status: DC

## 2016-09-14 MED ORDER — SODIUM CHLORIDE 0.9 % IJ SOLN
INTRAMUSCULAR | Status: DC | PRN
  Administered 2016-09-14: 40 mL via INTRAVENOUS

## 2016-09-14 MED ORDER — HYDROMORPHONE HCL 2 MG/ML IJ SOLN: 0.5000 mg | INTRAMUSCULAR | Status: DC | PRN

## 2016-09-14 MED ORDER — CHLORHEXIDINE GLUCONATE 4 % EX LIQD: 60.0000 mL | Freq: Once | CUTANEOUS | Status: DC

## 2016-09-14 MED ORDER — BENZONATATE 100 MG PO CAPS: 100.0000 mg | ORAL_CAPSULE | Freq: Three times a day (TID) | ORAL | Status: DC | PRN

## 2016-09-14 MED ORDER — OXYCODONE-ACETAMINOPHEN 5-325 MG PO TABS: 1.0000 | ORAL_TABLET | ORAL | 0 refills | Status: DC | PRN

## 2016-09-14 MED ORDER — FENTANYL CITRATE (PF) 100 MCG/2ML IJ SOLN
INTRAMUSCULAR | Status: AC
  Filled 2016-09-14: qty 2

## 2016-09-14 MED ORDER — ALUM & MAG HYDROXIDE-SIMETH 200-200-20 MG/5ML PO SUSP: 30.0000 mL | ORAL | Status: DC | PRN

## 2016-09-14 MED ORDER — POTASSIUM CHLORIDE IN NACL 20-0.9 MEQ/L-% IV SOLN
INTRAVENOUS | Status: DC
  Administered 2016-09-14 – 2016-09-15 (×2): via INTRAVENOUS
  Filled 2016-09-14 (×2): qty 1000

## 2016-09-14 MED ORDER — INSULIN ASPART 100 UNIT/ML ~~LOC~~ SOLN
0.0000 [IU] | Freq: Three times a day (TID) | SUBCUTANEOUS | Status: DC
  Administered 2016-09-15: 2 [IU] via SUBCUTANEOUS
  Administered 2016-09-15: 1 [IU] via SUBCUTANEOUS

## 2016-09-14 MED ORDER — HYDROMORPHONE HCL 1 MG/ML IJ SOLN
0.2500 mg | INTRAMUSCULAR | Status: DC | PRN
  Administered 2016-09-14 (×2): 0.5 mg via INTRAVENOUS

## 2016-09-14 MED ORDER — BISACODYL 5 MG PO TBEC: 5.0000 mg | DELAYED_RELEASE_TABLET | Freq: Every day | ORAL | Status: DC | PRN

## 2016-09-14 MED ORDER — SUCCINYLCHOLINE CHLORIDE 200 MG/10ML IV SOSY
PREFILLED_SYRINGE | INTRAVENOUS | Status: AC
  Filled 2016-09-14: qty 10

## 2016-09-14 MED ORDER — MIDAZOLAM HCL 2 MG/2ML IJ SOLN
INTRAMUSCULAR | Status: DC | PRN
  Administered 2016-09-14: 2 mg via INTRAVENOUS

## 2016-09-14 MED ORDER — ZOLPIDEM TARTRATE 5 MG PO TABS
5.0000 mg | ORAL_TABLET | Freq: Every evening | ORAL | Status: DC | PRN
  Filled 2016-09-14: qty 1

## 2016-09-14 MED ORDER — BUPIVACAINE IN DEXTROSE 0.75-8.25 % IT SOLN
INTRATHECAL | Status: DC | PRN
  Administered 2016-09-14: 2 mL via INTRATHECAL

## 2016-09-14 MED ORDER — LISINOPRIL 10 MG PO TABS
10.0000 mg | ORAL_TABLET | Freq: Every day | ORAL | Status: DC
  Administered 2016-09-14 – 2016-09-15 (×2): 10 mg via ORAL
  Filled 2016-09-14 (×2): qty 1

## 2016-09-14 MED ORDER — BUPIVACAINE-EPINEPHRINE 0.25% -1:200000 IJ SOLN
INTRAMUSCULAR | Status: DC | PRN
  Administered 2016-09-14: 10 mL

## 2016-09-14 MED ORDER — PROPOFOL 10 MG/ML IV BOLUS
INTRAVENOUS | Status: AC
  Filled 2016-09-14: qty 20

## 2016-09-14 MED ORDER — DOCUSATE SODIUM 100 MG PO CAPS
100.0000 mg | ORAL_CAPSULE | Freq: Two times a day (BID) | ORAL | Status: DC
  Administered 2016-09-14 – 2016-09-15 (×2): 100 mg via ORAL
  Filled 2016-09-14 (×2): qty 1

## 2016-09-14 MED ORDER — LACTATED RINGERS IV SOLN
INTRAVENOUS | Status: DC
  Administered 2016-09-14 (×2): via INTRAVENOUS

## 2016-09-14 MED ORDER — OXYCODONE HCL 5 MG PO TABS
ORAL_TABLET | ORAL | Status: AC
  Filled 2016-09-14: qty 2

## 2016-09-14 MED ORDER — ONDANSETRON HCL 4 MG/2ML IJ SOLN: 4.0000 mg | Freq: Four times a day (QID) | INTRAMUSCULAR | Status: DC | PRN

## 2016-09-14 MED ORDER — BUPIVACAINE LIPOSOME 1.3 % IJ SUSP
20.0000 mL | Freq: Once | INTRAMUSCULAR | Status: AC
  Administered 2016-09-14: 20 mL
  Filled 2016-09-14: qty 20

## 2016-09-14 MED ORDER — CEFAZOLIN SODIUM-DEXTROSE 2-4 GM/100ML-% IV SOLN
INTRAVENOUS | Status: AC
  Filled 2016-09-14: qty 100

## 2016-09-14 MED ORDER — METFORMIN HCL 500 MG PO TABS
500.0000 mg | ORAL_TABLET | Freq: Two times a day (BID) | ORAL | Status: DC
  Administered 2016-09-14 – 2016-09-15 (×3): 500 mg via ORAL
  Filled 2016-09-14 (×3): qty 1

## 2016-09-14 MED ORDER — METHYLPREDNISOLONE ACETATE 80 MG/ML IJ SUSP
INTRAMUSCULAR | Status: AC
  Filled 2016-09-14: qty 1

## 2016-09-14 MED ORDER — ACETAMINOPHEN 325 MG PO TABS: 650.0000 mg | ORAL_TABLET | Freq: Four times a day (QID) | ORAL | Status: DC | PRN

## 2016-09-14 MED ORDER — MAGNESIUM CITRATE PO SOLN: 1.0000 | Freq: Once | ORAL | Status: DC | PRN

## 2016-09-14 MED ORDER — CEFAZOLIN SODIUM-DEXTROSE 2-4 GM/100ML-% IV SOLN
2.0000 g | Freq: Four times a day (QID) | INTRAVENOUS | Status: AC
  Administered 2016-09-14: 2 g via INTRAVENOUS
  Filled 2016-09-14 (×2): qty 100

## 2016-09-14 MED ORDER — METHOCARBAMOL 500 MG PO TABS
500.0000 mg | ORAL_TABLET | Freq: Four times a day (QID) | ORAL | Status: DC | PRN
  Administered 2016-09-14 – 2016-09-15 (×2): 500 mg via ORAL
  Filled 2016-09-14 (×3): qty 1

## 2016-09-14 MED ORDER — MIDAZOLAM HCL 2 MG/2ML IJ SOLN
INTRAMUSCULAR | Status: AC
  Filled 2016-09-14: qty 2

## 2016-09-14 MED ORDER — METOCLOPRAMIDE HCL 5 MG PO TABS: 5.0000 mg | ORAL_TABLET | Freq: Three times a day (TID) | ORAL | Status: DC | PRN

## 2016-09-14 MED ORDER — METOCLOPRAMIDE HCL 5 MG/ML IJ SOLN: 5.0000 mg | Freq: Three times a day (TID) | INTRAMUSCULAR | Status: DC | PRN

## 2016-09-14 MED ORDER — SODIUM CHLORIDE 0.9 % IR SOLN
Status: DC | PRN
  Administered 2016-09-14: 3000 mL

## 2016-09-14 MED ORDER — LIDOCAINE 2% (20 MG/ML) 5 ML SYRINGE
INTRAMUSCULAR | Status: AC
  Filled 2016-09-14: qty 5

## 2016-09-14 MED ORDER — ASPIRIN EC 325 MG PO TBEC
325.0000 mg | DELAYED_RELEASE_TABLET | Freq: Every day | ORAL | Status: DC
  Administered 2016-09-15: 325 mg via ORAL
  Filled 2016-09-14: qty 1

## 2016-09-14 MED ORDER — MEPERIDINE HCL 25 MG/ML IJ SOLN: 6.2500 mg | INTRAMUSCULAR | Status: DC | PRN

## 2016-09-14 MED ORDER — BUPIVACAINE HCL (PF) 0.25 % IJ SOLN
INTRAMUSCULAR | Status: AC
  Filled 2016-09-14: qty 30

## 2016-09-14 MED ORDER — FENTANYL CITRATE (PF) 100 MCG/2ML IJ SOLN
50.0000 ug | Freq: Once | INTRAMUSCULAR | Status: AC
  Administered 2016-09-14: 50 ug via INTRAVENOUS

## 2016-09-14 MED ORDER — FENTANYL CITRATE (PF) 100 MCG/2ML IJ SOLN
INTRAMUSCULAR | Status: AC
  Administered 2016-09-14: 50 ug via INTRAVENOUS
  Filled 2016-09-14: qty 2

## 2016-09-14 MED ORDER — DIPHENHYDRAMINE HCL 12.5 MG/5ML PO ELIX: 12.5000 mg | ORAL_SOLUTION | ORAL | Status: DC | PRN

## 2016-09-14 MED ORDER — PROPOFOL 10 MG/ML IV BOLUS
INTRAVENOUS | Status: DC | PRN
  Administered 2016-09-14: 20 mg via INTRAVENOUS

## 2016-09-14 MED ORDER — ACETAMINOPHEN 650 MG RE SUPP: 650.0000 mg | Freq: Four times a day (QID) | RECTAL | Status: DC | PRN

## 2016-09-14 MED ORDER — PHENYLEPHRINE HCL 10 MG/ML IJ SOLN
INTRAMUSCULAR | Status: DC | PRN
  Administered 2016-09-14: 40 ug/min via INTRAVENOUS

## 2016-09-14 MED ORDER — ONDANSETRON HCL 4 MG PO TABS: 4.0000 mg | ORAL_TABLET | Freq: Three times a day (TID) | ORAL | 0 refills | Status: DC | PRN

## 2016-09-14 MED ORDER — PHENOL 1.4 % MT LIQD: 1.0000 | OROMUCOSAL | Status: DC | PRN

## 2016-09-14 MED ORDER — METHYLPREDNISOLONE ACETATE 80 MG/ML IJ SUSP
INTRAMUSCULAR | Status: DC | PRN
  Administered 2016-09-14: 80 mg via INTRA_ARTICULAR

## 2016-09-14 MED ORDER — PROPOFOL 500 MG/50ML IV EMUL
INTRAVENOUS | Status: DC | PRN
  Administered 2016-09-14: 75 ug/kg/min via INTRAVENOUS

## 2016-09-14 MED ORDER — MIDAZOLAM HCL 2 MG/2ML IJ SOLN
INTRAMUSCULAR | Status: AC
  Administered 2016-09-14: 2 mg via INTRAVENOUS
  Filled 2016-09-14: qty 2

## 2016-09-14 MED ORDER — LIDOCAINE 2% (20 MG/ML) 5 ML SYRINGE
INTRAMUSCULAR | Status: DC | PRN
  Administered 2016-09-14: 40 mg via INTRAVENOUS

## 2016-09-14 MED ORDER — HYDROCHLOROTHIAZIDE 12.5 MG PO CAPS
12.5000 mg | ORAL_CAPSULE | Freq: Every day | ORAL | Status: DC
  Administered 2016-09-14 – 2016-09-15 (×2): 12.5 mg via ORAL
  Filled 2016-09-14 (×2): qty 1

## 2016-09-14 MED ORDER — ONDANSETRON HCL 4 MG/2ML IJ SOLN
INTRAMUSCULAR | Status: AC
  Filled 2016-09-14: qty 2

## 2016-09-14 MED ORDER — OXYCODONE HCL 5 MG PO TABS
5.0000 mg | ORAL_TABLET | ORAL | Status: DC | PRN
  Administered 2016-09-14: 5 mg via ORAL
  Administered 2016-09-14 – 2016-09-15 (×4): 10 mg via ORAL
  Filled 2016-09-14 (×4): qty 2

## 2016-09-14 MED ORDER — AMOXICILLIN-POT CLAVULANATE 875-125 MG PO TABS
1.0000 | ORAL_TABLET | Freq: Two times a day (BID) | ORAL | Status: DC
  Administered 2016-09-14 – 2016-09-15 (×2): 1 via ORAL
  Filled 2016-09-14 (×2): qty 1

## 2016-09-14 MED ORDER — MIDAZOLAM HCL 2 MG/2ML IJ SOLN
2.0000 mg | Freq: Once | INTRAMUSCULAR | Status: AC
  Administered 2016-09-14: 2 mg via INTRAVENOUS

## 2016-09-14 MED ORDER — POLYETHYLENE GLYCOL 3350 17 G PO PACK: 17.0000 g | PACK | Freq: Every day | ORAL | Status: DC | PRN

## 2016-09-14 MED ORDER — ASPIRIN EC 325 MG PO TBEC: 325.0000 mg | DELAYED_RELEASE_TABLET | Freq: Every day | ORAL | 0 refills | Status: DC

## 2016-09-14 MED ORDER — CELECOXIB 200 MG PO CAPS
200.0000 mg | ORAL_CAPSULE | Freq: Two times a day (BID) | ORAL | Status: DC
  Administered 2016-09-14 – 2016-09-15 (×2): 200 mg via ORAL
  Filled 2016-09-14 (×2): qty 1

## 2016-09-14 MED ORDER — HYDROMORPHONE HCL 1 MG/ML IJ SOLN
INTRAMUSCULAR | Status: AC
  Filled 2016-09-14: qty 0.5

## 2016-09-14 MED ORDER — ONDANSETRON HCL 4 MG PO TABS: 4.0000 mg | ORAL_TABLET | Freq: Four times a day (QID) | ORAL | Status: DC | PRN

## 2016-09-14 MED ORDER — METHOCARBAMOL 1000 MG/10ML IJ SOLN
500.0000 mg | Freq: Four times a day (QID) | INTRAVENOUS | Status: DC | PRN
  Filled 2016-09-14: qty 5

## 2016-09-14 MED ORDER — ROCURONIUM BROMIDE 50 MG/5ML IV SOSY
PREFILLED_SYRINGE | INTRAVENOUS | Status: AC
  Filled 2016-09-14: qty 5

## 2016-09-14 MED ORDER — HYDROMORPHONE HCL 1 MG/ML IJ SOLN
INTRAMUSCULAR | Status: AC
Start: 2016-09-14 — End: 2016-09-15
  Filled 2016-09-14: qty 0.5

## 2016-09-14 MED ORDER — PROMETHAZINE HCL 25 MG/ML IJ SOLN: 6.2500 mg | INTRAMUSCULAR | Status: DC | PRN

## 2016-09-14 MED ORDER — ROPIVACAINE HCL 7.5 MG/ML IJ SOLN
INTRAMUSCULAR | Status: DC | PRN
  Administered 2016-09-14: 20 mL via PERINEURAL

## 2016-09-14 SURGICAL SUPPLY — 65 items
APL SKNCLS STERI-STRIP NONHPOA (GAUZE/BANDAGES/DRESSINGS) ×1
BANDAGE ACE 4X5 VEL STRL LF (GAUZE/BANDAGES/DRESSINGS) ×3 IMPLANT
BANDAGE ACE 6X5 VEL STRL LF (GAUZE/BANDAGES/DRESSINGS) ×3 IMPLANT
BANDAGE ESMARK 6X9 LF (GAUZE/BANDAGES/DRESSINGS) ×1 IMPLANT
BENZOIN TINCTURE PRP APPL 2/3 (GAUZE/BANDAGES/DRESSINGS) ×3 IMPLANT
BLADE SAG 18X100X1.27 (BLADE) ×6 IMPLANT
BNDG CMPR 9X6 STRL LF SNTH (GAUZE/BANDAGES/DRESSINGS) ×1
BNDG ESMARK 6X9 LF (GAUZE/BANDAGES/DRESSINGS) ×3
BOWL SMART MIX CTS (DISPOSABLE) ×3 IMPLANT
CAPT KNEE TOTAL 3 ×3 IMPLANT
CEMENT BONE SIMPLEX SPEEDSET (Cement) ×6 IMPLANT
CLOSURE WOUND 1/2 X4 (GAUZE/BANDAGES/DRESSINGS) ×2
COVER SURGICAL LIGHT HANDLE (MISCELLANEOUS) ×3 IMPLANT
CUFF TOURNIQUET SINGLE 34IN LL (TOURNIQUET CUFF) ×3 IMPLANT
DRAPE HALF SHEET 40X57 (DRAPES) ×3 IMPLANT
DRAPE IMP U-DRAPE 54X76 (DRAPES) ×3 IMPLANT
DRAPE PROXIMA HALF (DRAPES) ×3 IMPLANT
DRAPE U-SHAPE 47X51 STRL (DRAPES) ×3 IMPLANT
DRESSING AQUACEL AG SP 3.5X10 (GAUZE/BANDAGES/DRESSINGS) ×1 IMPLANT
DRSG AQUACEL AG ADV 3.5X10 (GAUZE/BANDAGES/DRESSINGS) ×3 IMPLANT
DRSG AQUACEL AG SP 3.5X10 (GAUZE/BANDAGES/DRESSINGS) ×3
DURAPREP 26ML APPLICATOR (WOUND CARE) ×6 IMPLANT
ELECT CAUTERY BLADE 6.4 (BLADE) ×3 IMPLANT
ELECT REM PT RETURN 9FT ADLT (ELECTROSURGICAL) ×3
ELECTRODE REM PT RTRN 9FT ADLT (ELECTROSURGICAL) ×1 IMPLANT
EVACUATOR 1/8 PVC DRAIN (DRAIN) IMPLANT
FACESHIELD WRAPAROUND (MASK) ×6 IMPLANT
GLOVE BIOGEL PI IND STRL 7.0 (GLOVE) ×1 IMPLANT
GLOVE BIOGEL PI INDICATOR 7.0 (GLOVE) ×2
GLOVE ORTHO TXT STRL SZ7.5 (GLOVE) ×3 IMPLANT
GLOVE SURG ORTHO 7.0 STRL STRW (GLOVE) ×3 IMPLANT
GOWN STRL REUS W/ TWL LRG LVL3 (GOWN DISPOSABLE) ×2 IMPLANT
GOWN STRL REUS W/ TWL XL LVL3 (GOWN DISPOSABLE) ×1 IMPLANT
GOWN STRL REUS W/TWL LRG LVL3 (GOWN DISPOSABLE) ×6
GOWN STRL REUS W/TWL XL LVL3 (GOWN DISPOSABLE) ×3
HANDPIECE INTERPULSE COAX TIP (DISPOSABLE) ×6
IMMOBILIZER KNEE 22 UNIV (SOFTGOODS) ×3 IMPLANT
IMMOBILIZER KNEE 24 THIGH 36 (MISCELLANEOUS) IMPLANT
IMMOBILIZER KNEE 24 UNIV (MISCELLANEOUS)
KIT BASIN OR (CUSTOM PROCEDURE TRAY) ×3 IMPLANT
KIT ROOM TURNOVER OR (KITS) ×3 IMPLANT
MANIFOLD NEPTUNE II (INSTRUMENTS) ×3 IMPLANT
NEEDLE 18GX1X1/2 (RX/OR ONLY) (NEEDLE) ×3 IMPLANT
NEEDLE HYPO 25GX1X1/2 BEV (NEEDLE) ×3 IMPLANT
NS IRRIG 1000ML POUR BTL (IV SOLUTION) ×3 IMPLANT
PACK TOTAL JOINT (CUSTOM PROCEDURE TRAY) ×3 IMPLANT
PACK UNIVERSAL I (CUSTOM PROCEDURE TRAY) ×3 IMPLANT
PAD ARMBOARD 7.5X6 YLW CONV (MISCELLANEOUS) ×6 IMPLANT
SET HNDPC FAN SPRY TIP SCT (DISPOSABLE) ×2 IMPLANT
STRIP CLOSURE SKIN 1/2X4 (GAUZE/BANDAGES/DRESSINGS) ×4 IMPLANT
SUCTION FRAZIER HANDLE 10FR (MISCELLANEOUS) ×2
SUCTION TUBE FRAZIER 10FR DISP (MISCELLANEOUS) ×1 IMPLANT
SUT MNCRL AB 4-0 PS2 18 (SUTURE) ×3 IMPLANT
SUT VIC AB 0 CT1 27 (SUTURE)
SUT VIC AB 0 CT1 27XBRD ANBCTR (SUTURE) IMPLANT
SUT VIC AB 1 CTX 36 (SUTURE) ×2
SUT VIC AB 1 CTX36XBRD ANBCTR (SUTURE) ×1 IMPLANT
SUT VIC AB 2-0 CT1 27 (SUTURE) ×6
SUT VIC AB 2-0 CT1 TAPERPNT 27 (SUTURE) ×2 IMPLANT
SYR 50ML LL SCALE MARK (SYRINGE) ×3 IMPLANT
SYR CONTROL 10ML LL (SYRINGE) ×3 IMPLANT
TOWEL OR 17X24 6PK STRL BLUE (TOWEL DISPOSABLE) ×3 IMPLANT
TOWEL OR 17X26 10 PK STRL BLUE (TOWEL DISPOSABLE) ×3 IMPLANT
TRAY CATH 16FR W/PLASTIC CATH (SET/KITS/TRAYS/PACK) IMPLANT
WATER STERILE IRR 1000ML POUR (IV SOLUTION) ×3 IMPLANT

## 2016-09-14 NOTE — Discharge Instructions (Signed)

## 2016-09-14 NOTE — Transfer of Care (Signed)
Immediate Anesthesia Transfer of Care Note  Patient: Jermaine James  Procedure(s) Performed: Procedure(s): TOTAL KNEE ARTHROPLASTY MANIPULATION LEFT KNEE (Right)  Patient Location: PACU  Anesthesia Type:MAC combined with regional for post-op pain  Level of Consciousness: awake, oriented and patient cooperative  Airway & Oxygen Therapy: Patient Spontanous Breathing and Patient connected to nasal cannula oxygen  Post-op Assessment: Report given to RN and Post -op Vital signs reviewed and stable  Post vital signs: Reviewed and stable  Last Vitals:  Vitals:   09/14/16 0755 09/14/16 0800  BP:  105/66  Pulse: 89 89  Resp: (!) 22 (!) 22  Temp:      Last Pain:  Vitals:   09/14/16 0724  TempSrc: Oral      Patients Stated Pain Goal: 3 (96/78/93 8101)  Complications: No apparent anesthesia complications

## 2016-09-14 NOTE — Anesthesia Procedure Notes (Signed)
Procedure Name: MAC Performed by: Claybon Jabs R Pre-anesthesia Checklist: Patient identified, Emergency Drugs available, Suction available, Patient being monitored and Timeout performed Oxygen Delivery Method: Simple face mask Placement Confirmation: positive ETCO2 Dental Injury: Teeth and Oropharynx as per pre-operative assessment

## 2016-09-14 NOTE — Anesthesia Postprocedure Evaluation (Signed)
Anesthesia Post Note  Patient: ANZAR BARNETT  Procedure(s) Performed: Procedure(s) (LRB): TOTAL KNEE ARTHROPLASTY MANIPULATION LEFT KNEE (Right)  Patient location during evaluation: PACU Anesthesia Type: Spinal Level of consciousness: oriented and awake and alert Pain management: pain level controlled Vital Signs Assessment: post-procedure vital signs reviewed and stable Respiratory status: spontaneous breathing, respiratory function stable and nonlabored ventilation Cardiovascular status: blood pressure returned to baseline and stable Postop Assessment: no headache and no backache Anesthetic complications: no       Last Vitals:  Vitals:   09/14/16 1159 09/14/16 1200  BP: 96/60   Pulse: 64 63  Resp: 20 17  Temp:      Last Pain:  Vitals:   09/14/16 1215  TempSrc:   PainSc: 0-No pain    LLE Motor Response: Purposeful movement (09/14/16 1215)   RLE Motor Response: Purposeful movement (09/14/16 1215)   L Sensory Level: L4-Anterior knee, lower leg (09/14/16 1215) R Sensory Level: L4-Anterior knee, lower leg (09/14/16 1215)  Evanthia Maund A.

## 2016-09-14 NOTE — Anesthesia Procedure Notes (Addendum)
Anesthesia Regional Block:  Adductor canal block  Pre-Anesthetic Checklist: ,, timeout performed, Correct Patient, Correct Site, Correct Laterality, Correct Procedure, Correct Position, site marked, Risks and benefits discussed,  Surgical consent,  Pre-op evaluation,  At surgeon's request and post-op pain management  Laterality: Right  Prep: chloraprep       Needles:   Needle Type: Echogenic Stimulator Needle     Needle Length: 9cm 9 cm Needle Gauge: 21 and 21 G    Additional Needles:  Procedures: ultrasound guided (picture in chart) Adductor canal block Narrative:  Start time: 09/14/2016 7:49 AM End time: 09/14/2016 7:54 AM Injection made incrementally with aspirations every 5 mL.  Performed by: Personally  Anesthesiologist: Josephine Igo  Additional Notes: Timeout performed. Patient sedated. Relevant anatomy ID'd using [place]. Incremental 63ml injection with frequent aspiration. Patient tolerated procedure well.

## 2016-09-14 NOTE — Procedures (Signed)
CPAP beside and ready for use.  Patient stated he would place on himself when ready for bed.

## 2016-09-14 NOTE — Interval H&P Note (Signed)
History and Physical Interval Note:  09/14/2016 7:28 AM  Jermaine James  has presented today for surgery, with the diagnosis of OA RIGHT KNEE  The various methods of treatment have been discussed with the patient and family. After consideration of risks, benefits and other options for treatment, the patient has consented to  Procedure(s): TOTAL KNEE ARTHROPLASTY (Right) as a surgical intervention .  The patient's history has been reviewed, patient examined, no change in status, stable for surgery.  I have reviewed the patient's chart and labs.  Questions were answered to the patient's satisfaction.     [name]

## 2016-09-14 NOTE — Anesthesia Procedure Notes (Signed)
Spinal  Patient location during procedure: OR Start time: 09/14/2016 8:48 AM Staffing Anesthesiologist: Josephine Igo Preanesthetic Checklist Completed: patient identified, site marked, surgical consent, pre-op evaluation, timeout performed, IV checked, risks and benefits discussed and monitors and equipment checked Spinal Block Patient position: sitting Prep: site prepped and draped and DuraPrep Patient monitoring: heart rate, cardiac monitor, continuous pulse ox and blood pressure Approach: midline Location: L3-4 Injection technique: single-shot Needle Needle type: Sprotte  Needle gauge: 24 G Needle length: 9 cm Needle insertion depth: 6 cm Assessment Sensory level: T4 Events: paresthesia Additional Notes Transient paresthesia right leg. Patient tolerated procedure well. Adequate sensory level.

## 2016-09-14 NOTE — Discharge Summary (Addendum)
Patient ID: Jermaine James MRN: [id number] DOB/AGE: 03-30-1956 61 y.o.  Admit date: 09/14/2016 Discharge date: 09/15/2016  Admission Diagnoses:  Active Problems:   Primary localized osteoarthritis of right knee   Discharge Diagnoses:  Same  Past Medical History:  Diagnosis Date  . 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 Dental crowns present   . Family history of adverse reaction to anesthesia    pt's father has hx. of post-op N/V  . High triglycerides    no current med.  Marland Kitchen History of gout   . History of kidney stones   . Hypertension    states under control with med., has been on med. ~ 57 yr.  . Non-insulin dependent type 2 diabetes mellitus ([place])   . Obesity, Class III, BMI 40-49.9 (morbid obesity) ([place])   . OSA on CPAP   . Osteoarthritis    knees (09/14/2016)  . 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 male who was admitted 09/14/2016 for operative treatment of primary localized osteoarthritis right knee. Patient has severe unremitting pain that affects sleep, daily activities, and work/hobbies. After pre-op clearance the patient was taken to the operating room on 09/14/2016 and underwent  Procedure(s): TOTAL KNEE ARTHROPLASTY MANIPULATION LEFT KNEE.    Patient was given perioperative antibiotics:  Anti-infectives    Start     Dose/Rate Route Frequency Ordered Stop   09/14/16 2200  amoxicillin-clavulanate (AUGMENTIN) 875-125 MG per tablet 1 tablet     1 tablet Oral 2 times daily 09/14/16 1630     09/14/16 1645  ceFAZolin (ANCEF) IVPB 2g/100 mL premix     2 g 200 mL/hr over 30 Minutes Intravenous Every 6 hours 09/14/16 1630 09/15/16 0444   09/14/16 0722  ceFAZolin (ANCEF) 2-4 GM/100ML-% IVPB    Comments:  Jermaine James   : cabinet override      09/14/16 0722 09/14/16 1929   09/14/16 0600  ceFAZolin (ANCEF) 3 g in dextrose 5 % 50 mL IVPB     3 g 130 mL/hr over 30 Minutes Intravenous On call to O.R. 09/13/16 1514 09/14/16 0850       Patient was given sequential compression devices, early ambulation, and chemoprophylaxis to prevent DVT.  Patient benefited maximally from hospital stay and there were no complications.    Recent vital signs:  Patient Vitals for the past 24 hrs:  BP Temp Temp src Pulse Resp SpO2 Weight  09/15/16 0515 (!) 104/51 97.4 F (36.3 C) Axillary 78 16 98 % -  09/15/16 0001 120/65 - - 80 18 96 % -  09/14/16 1955 123/71 98.8 F (37.1 C) Oral 99 16 95 % -  09/14/16 1620 136/87 98.5 F (36.9 C) - - 18 95 % -  09/14/16 1555 - 97.7 F (36.5 C) - - - - -  09/14/16 1430 - - - 79 18 95 % -  09/14/16 1429 113/72 - - 79 19 95 % -  09/14/16 1415 - - - 76 (!) 21 96 % -  09/14/16 1400 - - - 76 19 95 % -  09/14/16 1359 110/68 - - 72 20 98 % -  09/14/16 1345 - - - 72 20 99 % -  09/14/16 1330 - - -  69 16 100 % -  09/14/16 1329 110/70 - - 69 20 98 % -  09/14/16 1315 - - - 68 18 100 % -  09/14/16 1300 - - - 64 (!) 21 100 % -  09/14/16 1259 106/63 - - - (!) 22 - -  09/14/16 1245 - 97.3 F (36.3 C) - 74 15 100 % -  09/14/16 1244 106/63 - - 67 18 99 % -  09/14/16 1230 - - - 71 19 100 % -  09/14/16 1229 103/68 - - 70 20 100 % -  09/14/16 1215 (!) 91/56 - - 73 16 95 % -  09/14/16 1200 - - - 63 17 100 % -  09/14/16 1159 96/60 - - 64 20 98 % -  09/14/16 1145 - - - 68 20 100 % -  09/14/16 1144 97/60 - - 71 18 98 % -  09/14/16 1130 - - - 62 15 100 % -  09/14/16 1129 (!) 91/56 - - 62 13 100 % -  09/14/16 1115 - - - 61 14 98 % -  09/14/16 1114 (!) 101/50 - - 63 16 99 % -  09/14/16 1100 - - - 64 16 97 % -  09/14/16 1059 (!) 94/59 - - 67 (!) 21 94 % -  09/14/16 1045 - 97.7 F (36.5 C) - 75 19 100 % -  09/14/16 1044 (!) 88/53 - - 79 12 100 % -  09/14/16 0800 105/66 - - 89 (!) 22 99 %  -  09/14/16 0755 - - - 89 (!) 22 98 % -  09/14/16 0750 - - - 92 18 97 % -  09/14/16 0724 135/88 98 F (36.7 C) Oral 88 18 96 % (!) 137.9 kg (304 lb)     Recent laboratory studies:   Recent Labs  09/15/16 0319  WBC 8.7  HGB 11.8*  HCT 36.1*  PLT 240  NA 135  K 3.9  CL 101  CO2 26  BUN 9  CREATININE 0.79  GLUCOSE 178*  CALCIUM 8.4*     Discharge Medications:   Allergies as of 09/15/2016      Reactions   No Known Allergies       Medication List    STOP taking these medications   ibuprofen 800 MG tablet Commonly known as:  ADVIL,MOTRIN     TAKE these medications   amoxicillin-clavulanate 875-125 MG tablet Commonly known as:  AUGMENTIN Take 1 tablet by mouth 2 (two) times daily.   aspirin EC 325 MG tablet Take 1 tablet (325 mg total) by mouth daily. 1 tab a day for the next 30 days to prevent blood clots   benzonatate 100 MG capsule Commonly known as:  TESSALON Take 100 mg by mouth 3 (three) times daily as needed for cough.   lisinopril-hydrochlorothiazide 10-12.5 MG tablet Commonly known as:  PRINZIDE,ZESTORETIC TAKE 1 TABLET BY MOUTH DAILY.   metFORMIN 500 MG tablet Commonly known as:  GLUCOPHAGE TAKE 1 TABLET (500 MG TOTAL) BY MOUTH 2 (TWO) TIMES DAILY.   ondansetron 4 MG tablet Commonly known as:  ZOFRAN Take 1 tablet (4 mg total) by mouth every 8 (eight) hours as needed for nausea or vomiting. What changed:  Another medication with the same name was added. Make sure you understand how and when to take each.   ondansetron 4 MG tablet Commonly known as:  ZOFRAN Take 1 tablet (4 mg total) by mouth every 8 (eight) hours as needed for nausea or  vomiting. What changed:  You were already taking a medication with the same name, and this prescription was added. Make sure you understand how and when to take each.   oxyCODONE-acetaminophen 5-325 MG tablet Commonly known as:  PERCOCET Take 1-2 tablets by mouth every 4 (four) hours as needed for severe  pain. What changed:  Another medication with the same name was added. Make sure you understand how and when to take each.   oxyCODONE-acetaminophen 5-325 MG tablet Commonly known as:  PERCOCET Take 1-2 tablets by mouth every 4 (four) hours as needed for severe pain. What changed:  You were already taking a medication with the same name, and this prescription was added. Make sure you understand how and when to take each.       Diagnostic Studies: Dg Knee Right Port  Result Date: 09/14/2016 CLINICAL DATA:  Status post right knee replacement EXAM: PORTABLE RIGHT KNEE - 1-2 VIEW COMPARISON:  None. FINDINGS: Right knee prosthesis is seen. No acute bony or soft tissue abnormality is noted. IMPRESSION: Status post right knee replacement. Electronically Signed   By: Inez Catalina M.D.   On: 09/14/2016 11:52    Disposition: 01-Home or Self Care    Follow-up Information    [name], MD. Schedule an appointment as soon as possible for a visit in 2 weeks.   Specialty:  Orthopedic Surgery Contact information: [place] [address] [phone]            Signed: Fannie Knee 09/15/2016, 7:11 AM

## 2016-09-14 NOTE — Progress Notes (Signed)
Orthopedic Tech Progress Note Patient Details:  Jermaine James 08/21/1955 [id number]  CPM Right Knee CPM Right Knee: On Right Knee Flexion (Degrees): 90 Right Knee Extension (Degrees): 0 Additional Comments: trapeze bar patient helper   Hildred Priest 09/14/2016, 12:22 PM Pt exceeds weight limitations for trapeze bar patient helper; RN notified; therefore bar not provided

## 2016-09-15 ENCOUNTER — Encounter (HOSPITAL_COMMUNITY): Payer: Self-pay | Admitting: Orthopedic Surgery

## 2016-09-15 DIAGNOSIS — M170 Bilateral primary osteoarthritis of knee: Secondary | ICD-10-CM | POA: Diagnosis not present

## 2016-09-15 LAB — BASIC METABOLIC PANEL
ANION GAP: 8 (ref 5–15)
BUN: 9 mg/dL (ref 6–20)
CO2: 26 mmol/L (ref 22–32)
Calcium: 8.4 mg/dL — ABNORMAL LOW (ref 8.9–10.3)
Chloride: 101 mmol/L (ref 101–111)
Creatinine, Ser: 0.79 mg/dL (ref 0.61–1.24)
Glucose, Bld: 178 mg/dL — ABNORMAL HIGH (ref 65–99)
POTASSIUM: 3.9 mmol/L (ref 3.5–5.1)
SODIUM: 135 mmol/L (ref 135–145)

## 2016-09-15 LAB — CBC
HCT: 36.1 % — ABNORMAL LOW (ref 39.0–52.0)
Hemoglobin: 11.8 g/dL — ABNORMAL LOW (ref 13.0–17.0)
MCH: 29.5 pg (ref 26.0–34.0)
MCHC: 32.7 g/dL (ref 30.0–36.0)
MCV: 90.3 fL (ref 78.0–100.0)
PLATELETS: 240 10*3/uL (ref 150–400)
RBC: 4 MIL/uL — AB (ref 4.22–5.81)
RDW: 12.8 % (ref 11.5–15.5)
WBC: 8.7 10*3/uL (ref 4.0–10.5)

## 2016-09-15 LAB — GLUCOSE, CAPILLARY
GLUCOSE-CAPILLARY: 163 mg/dL — AB (ref 65–99)
Glucose-Capillary: 143 mg/dL — ABNORMAL HIGH (ref 65–99)
Glucose-Capillary: 137 mg/dL — ABNORMAL HIGH (ref 65–99)

## 2016-09-15 NOTE — Progress Notes (Signed)
Pt discharge education and instructions completed with pt and spouse at bedside; both voices understanding and denies any questions. Pt IV removed; pt handed his prescriptions for percocet; zofran and aspirin. Pt knee incision dsg remains unremarkable; pt discharge home with spouse to transport him home. Pt transported off unit via wheelchair with belongings and spouse to the side. Delia Heady RN

## 2016-09-15 NOTE — Progress Notes (Signed)
Subjective: 1 Day Post-Op Procedure(s) (LRB): TOTAL KNEE ARTHROPLASTY MANIPULATION LEFT KNEE (Right) Patient reports pain as mild.    Objective: Vital signs in last 24 hours: Temp:  [97.3 F (36.3 C)-98.8 F (37.1 C)] 97.4 F (36.3 C) (02/01 0515) Pulse Rate:  [61-99] 78 (02/01 0515) Resp:  [12-22] 16 (02/01 0515) BP: (88-136)/(50-87) 104/51 (02/01 0515) SpO2:  [94 %-100 %] 98 % (02/01 0515)  Intake/Output from previous day: 01/31 0701 - 02/01 0700 In: 2060 [I.V.:1960; IV Piggyback:100] Out: 2400 [Urine:2350; Blood:50] Intake/Output this shift: No intake/output data recorded.   Recent Labs  09/15/16 0319  HGB 11.8*    Recent Labs  09/15/16 0319  WBC 8.7  RBC 4.00*  HCT 36.1*  PLT 240    Recent Labs  09/15/16 0319  NA 135  K 3.9  CL 101  CO2 26  BUN 9  CREATININE 0.79  GLUCOSE 178*  CALCIUM 8.4*   No results for input(s): LABPT, INR in the last 72 hours.  Neurologically intact Neurovascular intact Sensation intact distally Intact pulses distally Dorsiflexion/Plantar flexion intact Incision: dressing C/D/I No cellulitis present Compartment soft  Assessment/Plan: 1 Day Post-Op Procedure(s) (LRB): TOTAL KNEE ARTHROPLASTY MANIPULATION LEFT KNEE (Right) Advance diet Up with therapy D/C IV fluids Discharge home with home health after second session of PT as long as patient feels ready WBAT RLE ABLA-mild and stable Please remove ace bandage and apply ted hose to BLE prior to d/c  Fannie Knee 09/15/2016, 7:26 AM

## 2016-09-15 NOTE — Progress Notes (Signed)
Orthopedic Tech Progress Note Patient Details:  Jermaine James 12-Sep-1955 [id number]  Patient ID: [name], male   DOB: January 13, 1956, 61 y.o.   MRN: [id number] Pt wants to wait to get in cpm. Will call when ready.  Karolee Stamps 09/15/2016, 6:33 AM

## 2016-09-15 NOTE — Op Note (Signed)
NAMEMarland Kitchen  James, Jermaine NO.:  [id number]  MEDICAL RECORD NO.:  [id number]  LOCATION:                               FACILITY:  [facility]  PHYSICIAN:  [name], M.D. DATE OF BIRTH:  Nov 14, 1955  DATE OF PROCEDURE:  09/14/2016 DATE OF DISCHARGE:                              OPERATIVE REPORT   PREOPERATIVE DIAGNOSES: 1. Right knee end-stage arthritis, primary generalized flexion     contracture varus alignment. 2. Left knee status post total knee replacement with residual     arthrofibrosis lacking full flexion.  POSTOPERATIVE DIAGNOSES: 1. Right knee end-stage arthritis, primary generalized flexion     contracture varus alignment. 2. Left knee status post total knee replacement with residual     arthrofibrosis lacking full flexion.  PROCEDURE: 1. Right knee modified minimally invasive total knee replacement with     Stryker triathlon prosthesis.  Cemented pegged posterior stabilized     #7 femoral component.  Cemented #7 tibial component, 9 mm PS     insert.  Cemented resurfacing 38-mm patellar component. 2. Left knee exam, manipulation, injection under anesthesia.  SURGEON:  [name], M.D.  ASSISTANT:  Elmyra Ricks, PA, present throughout the entire case and necessary for timely completion of procedure.  ANESTHESIA:  Spinal.  BLOOD LOSS:  Minimal.  SPECIMENS:  None.  CULTURES:  None.  COMPLICATIONS:  None.  DRESSINGS:  Soft compressive.  TOURNIQUET TIME:  On the right, 50 minutes.  DESCRIPTION OF PROCEDURE:  The patient was brought to the operating room and after adequate anesthesia had been obtained, the left knee was examined.  Couple degrees flexion contracture, further flexion about 95. Good stability.  This was manipulated breaking up obvious adhesions achieving full extension, flexion better than 125 degrees.  Injected intra-articularly under sterile technique with Depo-Medrol and Marcaine. Attention turned to the right.   Tourniquet applied.  Prepped and draped in usual sterile fashion.  Exsanguinated with elevation of Esmarch. Tourniquet inflated to 350 mmHg.  Straight incision above the patella down to the tibial tubercle.  Medial arthrotomy, vastus splitting, preserving quad tendon.  Flexible intramedullary guide distal femur. Initially 10 mm resection, but again full extension, I went back and resected 2 more mm.  Using epicondylar axis, the femur was sized, cut, and fitted for a pegged posterior stabilized #7 component.  Proximal tibial resection with extramedullary guide standard cut.  Again in order to achieved good motion and stability, I did a 2 more mm resection. Once that was complete, it was sized #7 component, rotation set with trials, hand reamed.  Patella exposed.  Posterior 10 mm removed. Drilled, sized, and fitted for a 38-mm component.  Trials removed, copious irrigation.  Cement prepared, placed on all components, firmly seated.  Polyethylene attached to tibia and knee reduced.  Patella held with a clamp.  Once the cement hardened, the knee was irrigated again. Soft tissue was injected with Exparel.  Arthrotomy closed with #1 Vicryl and subcutaneous and subcuticular closure.  Margins were injected with Marcaine.  Sterile compressive dressing applied.  Tourniquet deflated and removed.  Knee immobilizer applied.  At completion, I was very pleased with biomechanical axis, full extension, full  flexion, good alignment, good stability, and good patellar tracking.  Anesthesia reversed.  Brought to the recovery room.  Tolerated the surgery well. No complications.     [name], M.D.   ______________________________ [name], M.D.    DFM/MEDQ  D:  09/14/2016  T:  09/15/2016  Job:  [id number]

## 2016-09-15 NOTE — Progress Notes (Signed)
Physical Therapy Treatment Patient Details Name: Jermaine James MRN: [id number] DOB: 12/28/1955 Today's Date: 09/15/2016    History of Present Illness 61 yo male admitted to [facility] on 09/14/16 for right TKA and left knee maipulation. PMH significant for sleep apnea, L TKA 10/17, Gout, DM2, HLD.      PT Comments    Pt continues to be moving well with therapy during second session. Pt is able to perform LE strengthening exercises with min assistance to achieve full ROM to RLE with SAQ, SLR, and Hip abd/add exercises. Pt is making steady progress toward goals and was able to safely negotiate stairs with min guard.    Follow Up Recommendations  Home health PT     Equipment Recommendations  None recommended by PT    Recommendations for Other Services       Precautions / Restrictions Precautions Precautions: Knee Precaution Booklet Issued: Yes (comment) Precaution Comments: handout given and reviewed no pillow under knee Required Braces or Orthoses: Knee Immobilizer - Right Knee Immobilizer - Right: Other (comment) (no orders) Restrictions Weight Bearing Restrictions: Yes RLE Weight Bearing: Weight bearing as tolerated    Mobility  Bed Mobility Overal bed mobility: Needs Assistance Bed Mobility: Supine to Sit     Supine to sit: Min guard     General bed mobility comments: Min guard for safety  Transfers Overall transfer level: Needs assistance Equipment used: Rolling walker (2 wheeled) Transfers: Sit to/from Stand Sit to Stand: Min guard         General transfer comment: min guard with decreased cues for safe hand placement  Ambulation/Gait Ambulation/Gait assistance: Min guard Ambulation Distance (Feet): 200 Feet Assistive device: Rolling walker (2 wheeled) Gait Pattern/deviations: Step-through pattern;Decreased step length - left;Decreased stance time - right;Antalgic Gait velocity: decreased Gait velocity interpretation: Below normal speed for  age/gender General Gait Details: Mild Antalgic gait with min cues for sequencing   Stairs Stairs: Yes   Stair Management: One rail Right;Forwards Number of Stairs: 2 General stair comments: min cues for sequencing, min guard for safety  Wheelchair Mobility    Modified Rankin (Stroke Patients Only)       Balance                                    Cognition Arousal/Alertness: Awake/alert Behavior During Therapy: WFL for tasks assessed/performed Overall Cognitive Status: Within Functional Limits for tasks assessed                      Exercises Total Joint Exercises Ankle Circles/Pumps: AROM;Both;20 reps;Supine Quad Sets: AROM;Right;10 reps;Supine Towel Squeeze: AROM;Both;10 reps;Supine Short Arc Quad: AAROM;Right;10 reps;Supine Heel Slides: AAROM;Right;10 reps;Supine Hip ABduction/ADduction: AAROM;Right;10 reps;Supine Straight Leg Raises: AAROM;Right;10 reps;Supine Goniometric ROM: 2-82    General Comments        Pertinent Vitals/Pain Pain Assessment: 0-10 Pain Score: 6  Pain Location: right knee Pain Descriptors / Indicators: Sore;Aching;Guarding Pain Intervention(s): Monitored during session;Limited activity within patient's tolerance;Repositioned;Ice applied    Home Living Family/patient expects to be discharged to:: Private residence Living Arrangements: Spouse/significant other;Other relatives Available Help at Discharge: Family;Available PRN/intermittently Type of Home: House Home Access: Stairs to enter Entrance Stairs-Rails: Right Home Layout: Two level;Able to live on main level with bedroom/bathroom Home Equipment: Bedside commode;Walker - 2 wheels;Adaptive equipment;Tub bench;Cane - single point Additional Comments: has ice machine    Prior Function Level of Independence: Independent  Comments: patient was working as a Public relations account executive, driving and performing al own activity without assistance   PT Goals (current  goals can now be found in the care plan section) Acute Rehab PT Goals Patient Stated Goal: to get home today PT Goal Formulation: With patient Time For Goal Achievement: 09/22/16 Potential to Achieve Goals: Good Progress towards PT goals: Progressing toward goals    Frequency    7X/week      PT Plan Current plan remains appropriate    Co-evaluation             End of Session Equipment Utilized During Treatment: Gait belt Activity Tolerance: Patient tolerated treatment well Patient left: in chair;with call bell/phone within reach     Time: [id number] PT Time Calculation (min) (ACUTE ONLY): 26 min  Charges:  $Gait Training: 8-22 mins $Therapeutic Exercise: 8-22 mins                    G Codes:      Scheryl Marten PT, DPT  [phone]  09/15/2016, 3:57 PM

## 2016-09-15 NOTE — Evaluation (Signed)
Physical Therapy Evaluation Patient Details Name: Jermaine James MRN: [id number] DOB: [birth year] Today's Date: 09/15/2016   History of Present Illness  61 yo male admitted to [facility] on 09/14/16 for right TKA and left knee maipulation. PMH significant for sleep apnea, L TKA 10/17, Gout, DM2, HLD.    Clinical Impression  Pt is POD 1 and moving well with therapy. Very little pain noted with mobility this session with highest pain rated at 5/10 with knee flexion. Prior to admission, pt was completely independent and working full time. His wife is an Therapist, sports who works 12 hrs shifts at [employer]. Pt will benefit from continuing to be seen acutely in order to address below deficits and will benefit from HHPT upon discharge.     Follow Up Recommendations Home health PT    Equipment Recommendations  None recommended by PT    Recommendations for Other Services       Precautions / Restrictions Precautions Precautions: Knee Precaution Booklet Issued: Yes (comment) Precaution Comments: handout given and reviewed no pillow under knee Required Braces or Orthoses: Knee Immobilizer - Right Knee Immobilizer - Right: Other (comment) (no order in chart) Restrictions Weight Bearing Restrictions: Yes RLE Weight Bearing: Weight bearing as tolerated      Mobility  Bed Mobility Overal bed mobility: Needs Assistance Bed Mobility: Supine to Sit     Supine to sit: Min assist     General bed mobility comments: Min A to bring RLE EOB  Transfers Overall transfer level: Needs assistance Equipment used: Rolling walker (2 wheeled) Transfers: Sit to/from Stand Sit to Stand: Min assist;From elevated surface         General transfer comment: Min A for safety and cues for safe hand placement  Ambulation/Gait Ambulation/Gait assistance: Min assist Ambulation Distance (Feet): 150 Feet Assistive device: Rolling walker (2 wheeled) Gait Pattern/deviations: Step-through pattern;Decreased step length - left;Decreased  stance time - right;Antalgic Gait velocity: decreased Gait velocity interpretation: Below normal speed for age/gender General Gait Details: Mild Antalgic gait with min cues for sequencing  Stairs            Wheelchair Mobility    Modified Rankin (Stroke Patients Only)       Balance                                             Pertinent Vitals/Pain Pain Assessment: 0-10 Pain Score: 2  Pain Location: right knee Pain Descriptors / Indicators: Sore;Aching;Guarding Pain Intervention(s): Limited activity within patient's tolerance;Monitored during session;Premedicated before session;Ice applied    Home Living Family/patient expects to be discharged to:: Private residence Living Arrangements: Spouse/significant other;Other relatives Available Help at Discharge: Family;Available PRN/intermittently Type of Home: House Home Access: Stairs to enter Entrance Stairs-Rails: Right Entrance Stairs-Number of Steps: 3 steps Home Layout: Two level;Able to live on main level with bedroom/bathroom Home Equipment: Bedside commode;Walker - 2 wheels;Adaptive equipment;Tub bench;Cane - single point Additional Comments: has ice machine    Prior Function Level of Independence: Independent         Comments: patient was working as a Public relations account executive, 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 At leasty 3/5 ankle and 2/5 kene and hip per gross functional assessment    Cervical / Trunk Assessment Cervical / Trunk Assessment: Normal  Communication   Communication: No difficulties  Cognition Arousal/Alertness: Awake/alert Behavior During Therapy: WFL for  tasks assessed/performed Overall Cognitive Status: Within Functional Limits for tasks assessed                      General Comments      Exercises Total Joint Exercises Ankle Circles/Pumps: AROM;Both;20 reps;Supine Quad Sets: AROM;Right;10 reps;Supine Towel Squeeze: AROM;Both;10 reps;Supine Heel Slides: AAROM;Right;10 reps;Supine Goniometric ROM: 2-82   Assessment/Plan    PT Assessment Patient needs continued PT services  PT Problem List Decreased strength;Decreased range of motion;Decreased activity tolerance;Decreased balance;Decreased mobility;Decreased knowledge of use of DME;Pain          PT Treatment Interventions DME instruction;Gait training;Stair training;Functional mobility training;Balance training;Therapeutic exercise;Therapeutic activities;Patient/family education    PT Goals (Current goals can be found in the Care Plan section)  Acute Rehab PT Goals Patient Stated Goal: to get home today PT Goal Formulation: With patient Time For Goal Achievement: 09/22/16 Potential to Achieve Goals: Good    Frequency 7X/week   Barriers to discharge        Co-evaluation               End of Session Equipment Utilized During Treatment: Gait belt Activity Tolerance: Patient tolerated treatment well Patient left: in chair;with call bell/phone within reach;with family/visitor present Nurse Communication: Mobility status         Time: [id number] PT Time Calculation (min) (ACUTE ONLY): 40 min   Charges:   PT Evaluation $PT Eval Moderate Complexity: 1 Procedure PT Treatments $Gait Training: 8-22 mins $Therapeutic Exercise: 8-22 mins   PT G Codes:        Scheryl Marten PT, DPT  [phone]  09/15/2016, 1:52 PM

## 2016-09-15 NOTE — Care Management Note (Signed)
Case Management Note  Patient Details  Name: Jermaine James MRN: [id number] Date of Birth: 1956/03/29  Subjective/Objective:  61 yr old gentleman s/p right total knee arthroplasty.                  Action/Plan: Case manager spoke with patient concerning [place] and DMe needs. Patient was preoperatively setup with [facility], no changes. DME has been delivered to his home. Patient states he will have family support at discharge.    Expected Discharge Date:  09/15/16               Expected Discharge Plan:  [place]  In-House Referral:  NA  Discharge planning Services  CM Consult  Post Acute Care Choice:  Home Health Choice offered to:  Patient  DME Arranged:  3-N-1, [place] rolling DME Agency:  [employer]  HH Arranged:  PT HH Agency:  [facility] [employer]  Status of Service:  Completed, signed off  If discussed at [employer], dates discussed:    Additional Comments:  Ninfa Meeker, RN 09/15/2016, 4:20 PM

## 2016-09-15 NOTE — Progress Notes (Signed)
OT Cancellation Note and Discharge  Patient Details Name: Jermaine James MRN: [id number] DOB: 25-Sep-1955   Cancelled Treatment:    Reason Eval/Treat Not Completed: OT screened, no needs identified, will sign off. Pt was here 05/2016 for L TKA, Pt comfortable and confident in compensatory strategies and doing very well with PT. Thank you for this referral.  Jaci Carrel 09/15/2016, 3:34 PM  [name] OTR/L [phone]

## 2016-09-16 NOTE — Progress Notes (Signed)
   09/15/16 1341  PT G-Codes **NOT FOR INPATIENT CLASS**  Functional Assessment Tool Used clinical judgement  Functional Limitation Mobility: Walking and moving around  Mobility: Walking and Moving Around Current Status [phone]) CI  Mobility: Walking and Moving Around Goal Status [phone]) [place]   Late Entry G Codes 09-24-16  Scheryl Marten PT, DPT  [phone]

## 2016-12-27 ENCOUNTER — Other Ambulatory Visit: Payer: Self-pay | Admitting: Family Medicine

## 2017-02-27 ENCOUNTER — Other Ambulatory Visit: Payer: Self-pay | Admitting: Family Medicine

## 2017-05-04 ENCOUNTER — Encounter: Payer: Self-pay | Admitting: Family Medicine

## 2017-06-05 ENCOUNTER — Encounter: Payer: Self-pay | Admitting: Family Medicine

## 2017-06-05 LAB — HM DIABETES EYE EXAM

## 2017-08-24 ENCOUNTER — Other Ambulatory Visit: Payer: Self-pay | Admitting: Family Medicine

## 2017-09-27 ENCOUNTER — Other Ambulatory Visit: Payer: Self-pay | Admitting: Family Medicine

## 2017-09-28 MED FILL — LISINOPRIL-HCTZ 10-12.5 MG: 10-12.5 | 60 days supply | Qty: 60 | Fill #0

## 2017-09-28 MED FILL — metFORMIN HCL 500 MG TABS: 500 | 30 days supply | Qty: 60 | Fill #0

## 2017-10-02 ENCOUNTER — Other Ambulatory Visit: Payer: Self-pay | Admitting: Family Medicine

## 2017-10-02 MED ORDER — METFORMIN HCL 500 MG PO TABS: ORAL_TABLET | ORAL | 0 refills | Status: DC

## 2017-11-13 HISTORY — PX: RETINAL TEAR REPAIR CRYOTHERAPY: SHX5304

## 2017-11-21 ENCOUNTER — Other Ambulatory Visit: Payer: Self-pay | Admitting: Family Medicine

## 2017-11-21 MED FILL — LISINOPRIL-HCTZ 10-12.5 MG: 10-12.5 | 30 days supply | Qty: 30 | Fill #0

## 2017-11-21 MED FILL — metFORMIN HCL 500 MG TABS: 500 | 90 days supply | Qty: 180 | Fill #0

## 2017-11-21 NOTE — Telephone Encounter (Signed)
Will refill Rx for 1 month will need an OV for more refills

## 2017-11-29 DIAGNOSIS — H43819 Vitreous degeneration, unspecified eye: Secondary | ICD-10-CM | POA: Diagnosis not present

## 2017-11-29 DIAGNOSIS — H43811 Vitreous degeneration, right eye: Secondary | ICD-10-CM | POA: Diagnosis not present

## 2017-11-30 DIAGNOSIS — H33311 Horseshoe tear of retina without detachment, right eye: Secondary | ICD-10-CM | POA: Diagnosis not present

## 2017-12-05 DIAGNOSIS — H43811 Vitreous degeneration, right eye: Secondary | ICD-10-CM | POA: Diagnosis not present

## 2017-12-05 DIAGNOSIS — H2511 Age-related nuclear cataract, right eye: Secondary | ICD-10-CM | POA: Diagnosis not present

## 2017-12-05 DIAGNOSIS — H33311 Horseshoe tear of retina without detachment, right eye: Secondary | ICD-10-CM | POA: Diagnosis not present

## 2017-12-05 DIAGNOSIS — H4311 Vitreous hemorrhage, right eye: Secondary | ICD-10-CM | POA: Diagnosis not present

## 2017-12-19 DIAGNOSIS — H33311 Horseshoe tear of retina without detachment, right eye: Secondary | ICD-10-CM | POA: Diagnosis not present

## 2017-12-25 ENCOUNTER — Other Ambulatory Visit: Payer: Self-pay | Admitting: Family Medicine

## 2017-12-25 MED FILL — LISINOPRIL-HCTZ 10-12.5 MG: 10-12.5 | 30 days supply | Qty: 30 | Fill #0

## 2017-12-27 IMAGING — DX DG KNEE 1-2V PORT*R*
2 series · 2 of 2 positions shown · non-contrast
Comparison: None.

CLINICAL DATA: Status post right knee replacement

EXAM:
PORTABLE RIGHT KNEE - 1-2 VIEW

[knee ap]
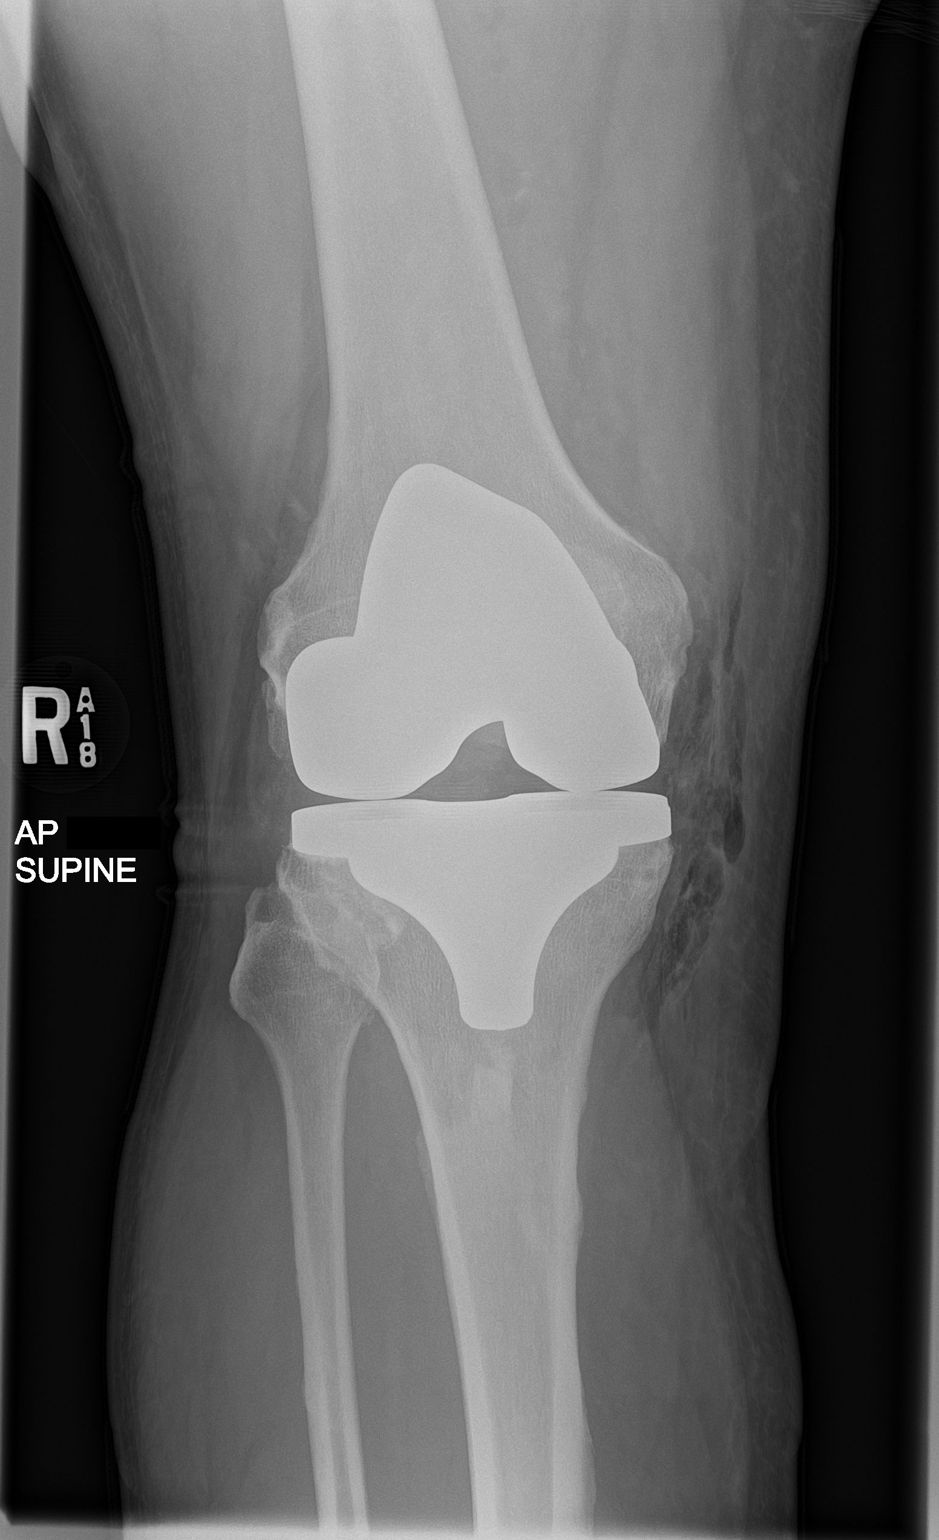

[knee lat]
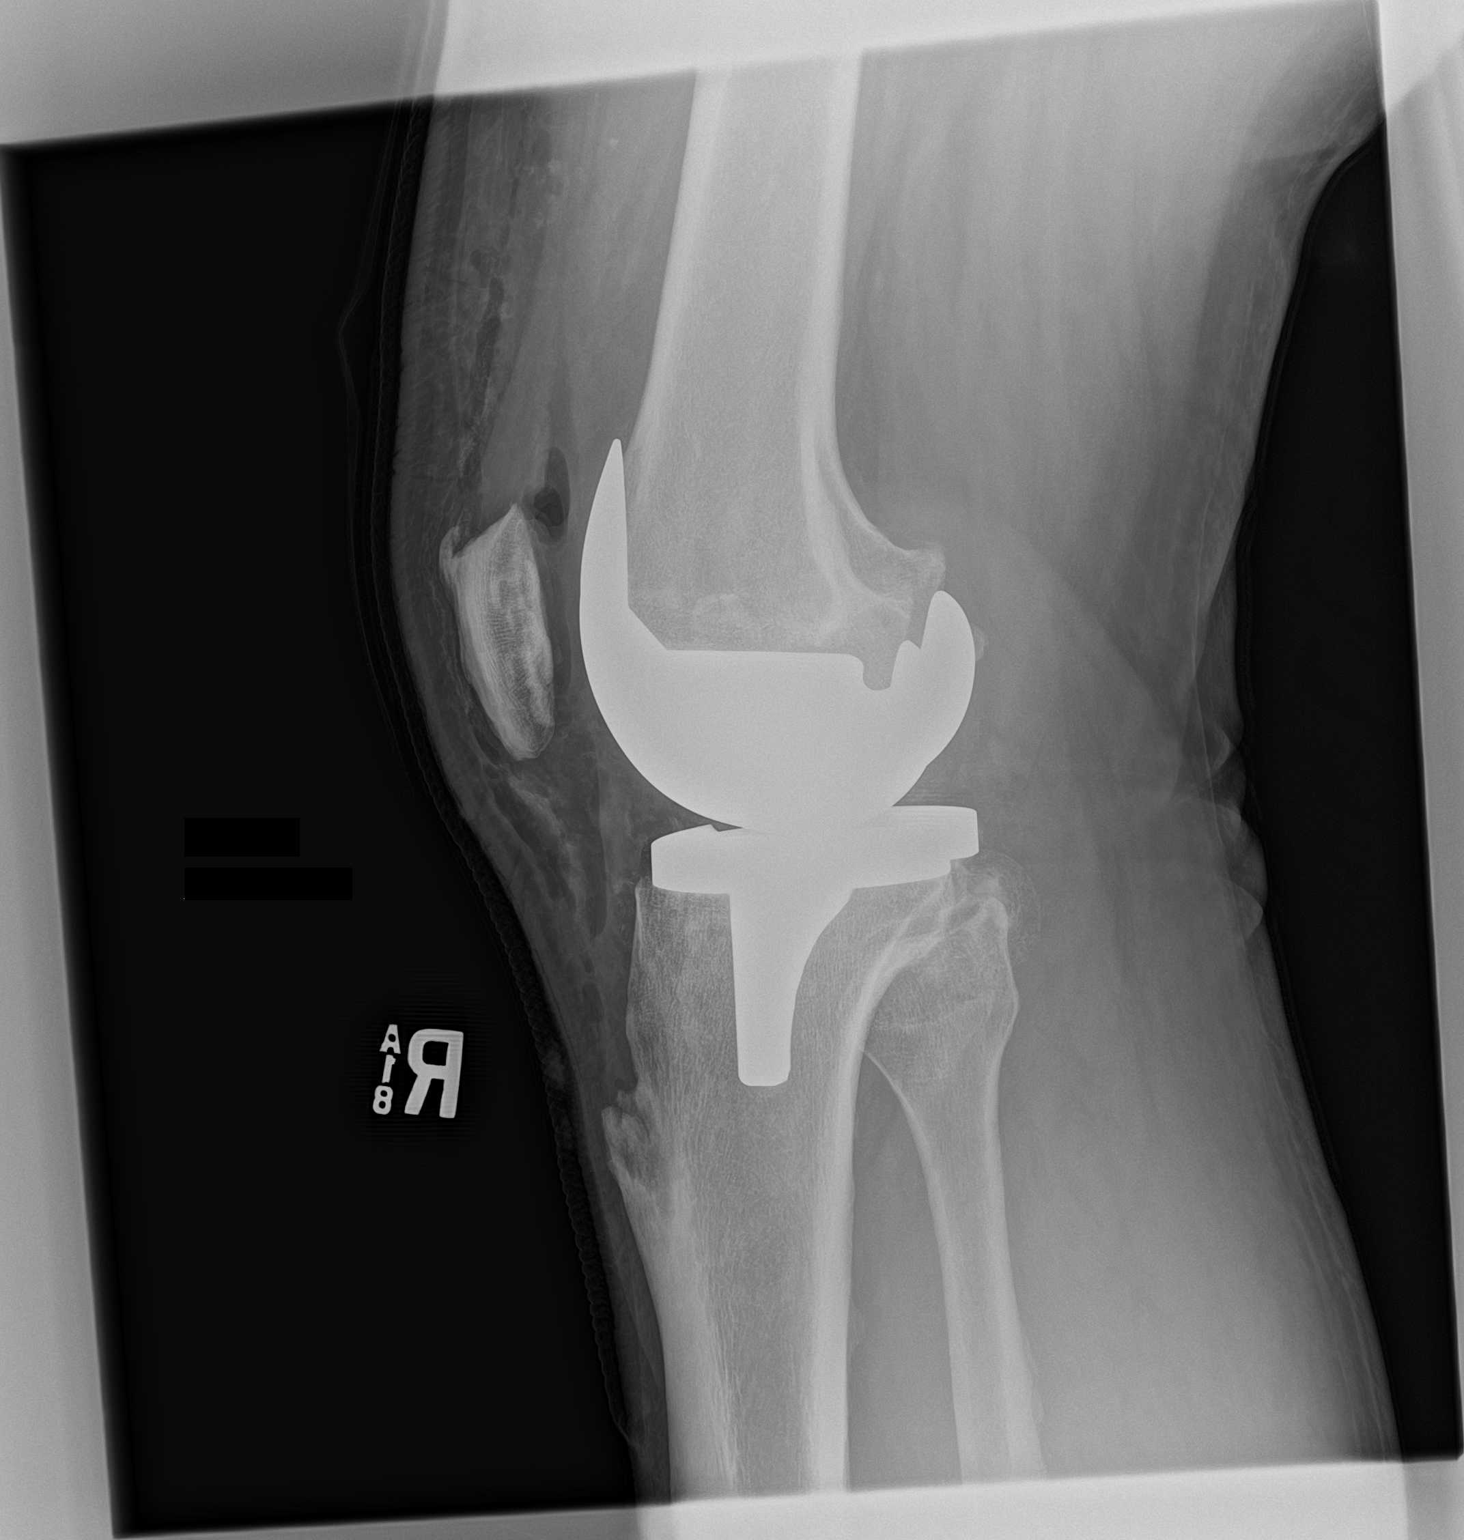

[2 of 2 positions shown; findings below may reference images not displayed]

FINDINGS: Right knee prosthesis is seen. No acute bony or soft tissue
abnormality is noted.
IMPRESSION: Status post right knee replacement.

## 2018-01-02 DIAGNOSIS — H4311 Vitreous hemorrhage, right eye: Secondary | ICD-10-CM | POA: Diagnosis not present

## 2018-01-15 ENCOUNTER — Other Ambulatory Visit: Payer: Self-pay | Admitting: Family Medicine

## 2018-01-15 NOTE — Telephone Encounter (Signed)
Called pt left a message to return my call in the office and schedule an OV with Dr Sarajane Jews last visit was 2017

## 2018-01-15 NOTE — Telephone Encounter (Signed)
Patient wants to know if Dr Sarajane Jews can refill just 10 days worth of pills to get him by until June 20th when his appointment is, please advise. Call back @ [phone]

## 2018-01-26 MED FILL — LISINOPRIL-HCTZ 10-12.5 MG: 10-12.5 | 30 days supply | Qty: 30 | Fill #0

## 2018-02-01 ENCOUNTER — Encounter: Payer: Self-pay | Admitting: Family Medicine

## 2018-02-01 ENCOUNTER — Ambulatory Visit (INDEPENDENT_AMBULATORY_CARE_PROVIDER_SITE_OTHER): Payer: 59 | Admitting: Family Medicine

## 2018-02-01 VITALS — BP 140/80 | HR 89 | Temp 98.3°F | Ht 70.75 in | Wt 307.2 lb

## 2018-02-01 DIAGNOSIS — E119 Type 2 diabetes mellitus without complications: Secondary | ICD-10-CM | POA: Diagnosis not present

## 2018-02-01 DIAGNOSIS — Z125 Encounter for screening for malignant neoplasm of prostate: Secondary | ICD-10-CM | POA: Diagnosis not present

## 2018-02-01 DIAGNOSIS — Z0000 Encounter for general adult medical examination without abnormal findings: Secondary | ICD-10-CM

## 2018-02-01 LAB — BASIC METABOLIC PANEL
BUN: 16 mg/dL (ref 6–23)
CHLORIDE: 100 meq/L (ref 96–112)
CO2: 26 mEq/L (ref 19–32)
Calcium: 9.9 mg/dL (ref 8.4–10.5)
Creatinine, Ser: 0.85 mg/dL (ref 0.40–1.50)
GFR: 96.54 mL/min (ref >60.00)
Glucose, Bld: 139 mg/dL — ABNORMAL HIGH (ref 70–99)
POTASSIUM: 4.4 meq/L (ref 3.5–5.1)
SODIUM: 138 meq/L (ref 135–145)

## 2018-02-01 LAB — POC URINALSYSI DIPSTICK (AUTOMATED)
BILIRUBIN UA: NEGATIVE
GLUCOSE UA: NEGATIVE
Ketones, UA: NEGATIVE
Leukocytes, UA: NEGATIVE
NITRITE UA: NEGATIVE
PH UA: 6 (ref 5.0–8.0)
Protein, UA: NEGATIVE
RBC UA: POSITIVE
SPEC GRAV UA: 1.025 (ref 1.010–1.025)
UROBILINOGEN UA: 0.2 U/dL

## 2018-02-01 LAB — CBC WITH DIFFERENTIAL/PLATELET
BASOS ABS: 0.1 10*3/uL (ref 0.0–0.1)
Basophils Relative: 1.1 % (ref 0.0–3.0)
EOS ABS: 0.6 10*3/uL (ref 0.0–0.7)
Eosinophils Relative: 9 % — ABNORMAL HIGH (ref 0.0–5.0)
HCT: 44.1 % (ref 39.0–52.0)
Hemoglobin: 15.1 g/dL (ref 13.0–17.0)
Lymphocytes Relative: 26.1 % (ref 12.0–46.0)
Lymphs Abs: 1.8 10*3/uL (ref 0.7–4.0)
MCHC: 34.1 g/dL (ref 30.0–36.0)
MCV: 91.7 fl (ref 78.0–100.0)
MONO ABS: 0.7 10*3/uL (ref 0.1–1.0)
Monocytes Relative: 10.3 % (ref 3.0–12.0)
NEUTROS PCT: 53.5 % (ref 43.0–77.0)
Neutro Abs: 3.6 10*3/uL (ref 1.4–7.7)
PLATELETS: 257 10*3/uL (ref 150.0–400.0)
RBC: 4.81 Mil/uL (ref 4.22–5.81)
RDW: 13.5 % (ref 11.5–15.5)
WBC: 6.7 10*3/uL (ref 4.0–10.5)

## 2018-02-01 LAB — LIPID PANEL
CHOL/HDL RATIO: 5
CHOLESTEROL: 173 mg/dL (ref 0–200)
HDL: 37.8 mg/dL — ABNORMAL LOW (ref >39.00)
NonHDL: 135.07
Triglycerides: 256 mg/dL — ABNORMAL HIGH (ref 0.0–149.0)
VLDL: 51.2 mg/dL — ABNORMAL HIGH (ref 0.0–40.0)

## 2018-02-01 LAB — HEPATIC FUNCTION PANEL
ALBUMIN: 4.7 g/dL (ref 3.5–5.2)
ALK PHOS: 62 U/L (ref 39–117)
ALT: 25 U/L (ref 0–53)
AST: 18 U/L (ref 0–37)
Bilirubin, Direct: 0.1 mg/dL (ref 0.0–0.3)
TOTAL PROTEIN: 8.1 g/dL (ref 6.0–8.3)
Total Bilirubin: 0.4 mg/dL (ref 0.2–1.2)

## 2018-02-01 LAB — HEMOGLOBIN A1C: HEMOGLOBIN A1C: 6.8 % — AB (ref 4.6–6.5)

## 2018-02-01 LAB — LDL CHOLESTEROL, DIRECT: LDL DIRECT: 86 mg/dL

## 2018-02-01 LAB — TSH: TSH: 2.81 u[IU]/mL (ref 0.35–4.50)

## 2018-02-01 LAB — PSA: PSA: 0.24 ng/mL (ref 0.10–4.00)

## 2018-02-01 MED ORDER — METFORMIN HCL 500 MG PO TABS: ORAL_TABLET | ORAL | 3 refills | Status: DC

## 2018-02-01 MED ORDER — LISINOPRIL-HYDROCHLOROTHIAZIDE 10-12.5 MG PO TABS: 1.0000 | ORAL_TABLET | Freq: Every day | ORAL | 3 refills | Status: DC

## 2018-02-01 MED FILL — metFORMIN HCL 500 MG TABS: 500 | 90 days supply | Qty: 180 | Fill #0

## 2018-02-01 NOTE — Progress Notes (Signed)
   Subjective:    Patient ID: Jermaine James, male    DOB: 08/06/1956, 62 y.o.   MRN: [id number]  HPI Here for a well exam. He feels fine in general, although he is dealing with floaters in the right eye. He had several vitreous tears a few months ago and he had laser surgery per Dr. Sherlynn Stalls. Since then his vision has improved but he has a ways to go.    Review of Systems  Constitutional: Negative.   HENT: Negative.   Eyes: Negative.   Respiratory: Negative.   Cardiovascular: Negative.   Gastrointestinal: Negative.   Genitourinary: Negative.   Musculoskeletal: Negative.   Skin: Negative.   Neurological: Negative.   Psychiatric/Behavioral: Negative.        Objective:   Physical Exam  Constitutional: He is oriented to person, place, and time. No distress.  Morbidly obese   HENT:  Head: Normocephalic and atraumatic.  Right Ear: External ear normal.  Left Ear: External ear normal.  Nose: Nose normal.  Mouth/Throat: Oropharynx is clear and moist. No oropharyngeal exudate.  Eyes: Pupils are equal, round, and reactive to light. Conjunctivae and EOM are normal. Right eye exhibits no discharge. Left eye exhibits no discharge. No scleral icterus.  Neck: Neck supple. No JVD present. No tracheal deviation present. No thyromegaly present.  Cardiovascular: Normal rate, regular rhythm, normal heart sounds and intact distal pulses. Exam reveals no gallop and no friction rub.  No murmur heard. Pulmonary/Chest: Effort normal and breath sounds normal. No respiratory distress. He has no wheezes. He has no rales. He exhibits no tenderness.  Abdominal: Soft. Bowel sounds are normal. He exhibits no distension and no mass. There is no tenderness. There is no rebound and no guarding.  Genitourinary: Rectum normal, prostate normal and penis normal. Rectal exam shows guaiac negative stool. No penile tenderness.  Musculoskeletal: Normal range of motion. He exhibits no edema or tenderness.    Lymphadenopathy:    He has no cervical adenopathy.  Neurological: He is alert and oriented to person, place, and time. He has normal reflexes. He displays normal reflexes. No cranial nerve deficit. He exhibits normal muscle tone. Coordination normal.  Skin: Skin is warm and dry. No rash noted. He is not diaphoretic. No erythema. No pallor.  Psychiatric: He has a normal mood and affect. His behavior is normal. Judgment and thought content normal.          Assessment & Plan:  Well exam. We discussed diet and exercise. Get fasting labs. Set up another colonoscopy.  Alysia Penna, MD

## 2018-02-05 ENCOUNTER — Other Ambulatory Visit: Payer: Self-pay

## 2018-02-05 MED ORDER — FENOFIBRATE 160 MG PO TABS: 160.0000 mg | ORAL_TABLET | Freq: Every day | ORAL | 3 refills | Status: DC

## 2018-02-05 MED FILL — FENOFIBRATE 160 MG TABLET: 160 | 90 days supply | Qty: 90 | Fill #0

## 2018-02-05 NOTE — Telephone Encounter (Signed)
Call in Fenofibrate 160 mg to take daily, #90 with 3 rf.

## 2018-02-14 ENCOUNTER — Encounter: Payer: Self-pay | Admitting: Internal Medicine

## 2018-02-21 MED FILL — LISINOPRIL-HCTZ 10-12.5 MG: 10-12.5 | 90 days supply | Qty: 90 | Fill #0

## 2018-04-12 ENCOUNTER — Encounter: Payer: Self-pay | Admitting: Internal Medicine

## 2018-04-12 ENCOUNTER — Ambulatory Visit (AMBULATORY_SURGERY_CENTER): Payer: Self-pay

## 2018-04-12 VITALS — Ht 72.0 in | Wt 313.4 lb

## 2018-04-12 DIAGNOSIS — Z1211 Encounter for screening for malignant neoplasm of colon: Secondary | ICD-10-CM

## 2018-04-12 MED ORDER — NA SULFATE-K SULFATE-MG SULF 17.5-3.13-1.6 GM/177ML PO SOLN: 1.0000 | Freq: Once | ORAL | 0 refills | Status: AC

## 2018-04-12 MED FILL — SUPREP BOWEL PREP KIT: 17.5-3.13-1 | 1 days supply | Qty: 354 | Fill #0

## 2018-04-12 NOTE — Progress Notes (Signed)
Denies allergies to eggs or soy products. Denies complication of anesthesia or sedation. Denies use of weight loss medication. Denies use of O2.   Emmi instructions declined.  

## 2018-04-24 MED FILL — ONDANSETRON HCL 4 MG TABLET: 4 | 4 days supply | Qty: 15 | Fill #0

## 2018-04-24 MED FILL — AZITHROMYCIN 500 MG TABLET: 500 | 3 days supply | Qty: 4 | Fill #0

## 2018-04-26 ENCOUNTER — Ambulatory Visit (AMBULATORY_SURGERY_CENTER): Payer: 59 | Admitting: Internal Medicine

## 2018-04-26 ENCOUNTER — Encounter: Payer: Self-pay | Admitting: Internal Medicine

## 2018-04-26 VITALS — BP 95/56 | HR 69 | Temp 99.1°F | Resp 14 | Ht 72.0 in | Wt 313.0 lb

## 2018-04-26 DIAGNOSIS — D122 Benign neoplasm of ascending colon: Secondary | ICD-10-CM | POA: Diagnosis not present

## 2018-04-26 DIAGNOSIS — Z1211 Encounter for screening for malignant neoplasm of colon: Secondary | ICD-10-CM | POA: Diagnosis not present

## 2018-04-26 DIAGNOSIS — D123 Benign neoplasm of transverse colon: Secondary | ICD-10-CM

## 2018-04-26 HISTORY — PX: COLONOSCOPY: SHX174

## 2018-04-26 MED ORDER — SODIUM CHLORIDE 0.9 % IV SOLN: 500.0000 mL | Freq: Once | INTRAVENOUS | Status: DC

## 2018-04-26 NOTE — Patient Instructions (Signed)
Thank you for allowing [place] to care for you today!  Resume current diet and medications.  Return to normal activities tomorrow 04/27/18  Await pathology results by mail, approx 2 weeks.  Next colonoscopy dependent on pathology results.     YOU HAD AN ENDOSCOPIC PROCEDURE TODAY AT [facility]:   Refer to the procedure report that was given to you for any specific questions about what was found during the examination.  If the procedure report does not answer your questions, please call your gastroenterologist to clarify.  If you requested that your care partner not be given the details of your procedure findings, then the procedure report has been included in a sealed envelope for you to review at your convenience later.  YOU SHOULD EXPECT: Some feelings of bloating in the abdomen. Passage of more gas than usual.  Walking can help get rid of the air that was put into your GI tract during the procedure and reduce the bloating. If you had a lower endoscopy (such as a colonoscopy or flexible sigmoidoscopy) you may notice spotting of blood in your stool or on the toilet paper. If you underwent a bowel prep for your procedure, you may not have a normal bowel movement for a few days.  Please Note:  You might notice some irritation and congestion in your nose or some drainage.  This is from the oxygen used during your procedure.  There is no need for concern and it should clear up in a day or so.  SYMPTOMS TO REPORT IMMEDIATELY:   Following lower endoscopy (colonoscopy or flexible sigmoidoscopy):  Excessive amounts of blood in the stool  Significant tenderness or worsening of abdominal pains  Swelling of the abdomen that is new, acute  Fever of 100F or higher    For urgent or emergent issues, a gastroenterologist can be reached at any hour by calling [phone].   DIET:  We do recommend a small meal at first, but then you may proceed to your regular diet.  Drink plenty of  fluids but you should avoid alcoholic beverages for 24 hours.  ACTIVITY:  You should plan to take it easy for the rest of today and you should NOT DRIVE or use heavy machinery until tomorrow (because of the sedation medicines used during the test).    FOLLOW UP: Our staff will call the number listed on your records the next business day following your procedure to check on you and address any questions or concerns that you may have regarding the information given to you following your procedure. If we do not reach you, we will leave a message.  However, if you are feeling well and you are not experiencing any problems, there is no need to return our call.  We will assume that you have returned to your regular daily activities without incident.  If any biopsies were taken you will be contacted by phone or by letter within the next 1-3 weeks.  Please call us at [phone] if you have not heard about the biopsies in 3 weeks.    SIGNATURES/CONFIDENTIALITY: You and/or your care partner have signed paperwork which will be entered into your electronic medical record.  These signatures attest to the fact that that the information above on your After Visit Summary has been reviewed and is understood.  Full responsibility of the confidentiality of this discharge information lies with you and/or your care-partner.

## 2018-04-26 NOTE — Op Note (Signed)
[facility] Patient Name: Jermaine James Procedure Date: 04/26/2018 8:34 AM MRN: [id number] Endoscopist: Jerene Bears , MD Age: 62 Referring MD:  Date of Birth: November 29, 1955 Gender: Male Account #: [id number] Procedure:                Colonoscopy Indications:              Screening for colorectal malignant neoplasm, Last                            colonoscopy: 2007 Medicines:                Monitored Anesthesia Care Procedure:                Pre-Anesthesia Assessment:                           - Prior to the procedure, a History and Physical                            was performed, and patient medications and                            allergies were reviewed. The patient's tolerance of                            previous anesthesia was also reviewed. The risks                            and benefits of the procedure and the sedation                            options and risks were discussed with the patient.                            All questions were answered, and informed consent                            was obtained. Prior Anticoagulants: The patient has                            taken no previous anticoagulant or antiplatelet                            agents. ASA Grade Assessment: III - A patient with                            severe systemic disease. After reviewing the risks                            and benefits, the patient was deemed in                            satisfactory condition to undergo the procedure.  After obtaining informed consent, the colonoscope                            was passed under direct vision. Throughout the                            procedure, the patient's blood pressure, pulse, and                            oxygen saturations were monitored continuously. The                            Colonoscope was introduced through the anus and                            advanced to the cecum, identified by  appendiceal                            orifice and ileocecal valve. The colonoscopy was                            performed without difficulty. The patient tolerated                            the procedure well. The quality of the bowel                            preparation was good. The ileocecal valve,                            appendiceal orifice, and rectum were photographed. Scope In: 8:41:18 AM Scope Out: 8:57:27 AM Scope Withdrawal Time: 0 hours 12 minutes 13 seconds  Total Procedure Duration: 0 hours 16 minutes 9 seconds  Findings:                 The digital rectal exam was normal.                           A 6 mm polyp was found in the ascending colon. The                            polyp was flat. The polyp was removed with a cold                            snare. Resection and retrieval were complete.                           A 4 mm polyp was found in the transverse colon. The                            polyp was sessile. The polyp was removed with a  cold snare. Resection and retrieval were complete.                           Multiple small and large-mouthed diverticula were                            found from ascending colon to sigmoid colon.                           External and internal hemorrhoids were found during                            retroflexion. The hemorrhoids were medium-sized. Complications:            No immediate complications. Estimated Blood Loss:     Estimated blood loss was minimal. Impression:               - One 6 mm polyp in the ascending colon, removed                            with a cold snare. Resected and retrieved.                           - One 4 mm polyp in the transverse colon, removed                            with a cold snare. Resected and retrieved.                           - Moderate diverticulosis from ascending colon to                            sigmoid colon.                           -  External and internal hemorrhoids. Recommendation:           - Patient has a contact number available for                            emergencies. The signs and symptoms of potential                            delayed complications were discussed with the                            patient. Return to normal activities tomorrow.                            Written discharge instructions were provided to the                            patient.                           - Resume previous diet.                           -  Continue present medications.                           - Await pathology results.                           - Repeat colonoscopy is recommended. The                            colonoscopy date will be determined after pathology                            results from today's exam become available for                            review. Jerene Bears, MD 04/26/2018 9:01:15 AM This report has been signed electronically.

## 2018-04-26 NOTE — Progress Notes (Signed)
Called to room to assist during endoscopic procedure.  Patient ID and intended procedure confirmed with present staff. Received instructions for my participation in the procedure from the performing physician.  

## 2018-04-26 NOTE — Progress Notes (Signed)
To PACU VSS. Report to RN.tb 

## 2018-04-26 NOTE — Progress Notes (Signed)
Pt has rx for zithromax and zofran but is not taking these meds yet.  He is going on a trip to [place] and has rx in case there is a need for them. maw

## 2018-04-26 NOTE — Progress Notes (Signed)
Pt's states no medical or surgical changes since previsit or office visit. 

## 2018-04-27 ENCOUNTER — Telehealth: Payer: Self-pay

## 2018-04-27 NOTE — Telephone Encounter (Signed)
  Follow up Call-  Call back number 04/26/2018  Post procedure Call Back phone  # [phone] hm  Permission to leave phone message Yes  Some recent data might be hidden     Patient questions:  Do you have a fever, pain , or abdominal swelling? No. Pain Score  0 *  Have you tolerated food without any problems? Yes.    Have you been able to return to your normal activities? Yes.    Do you have any questions about your discharge instructions: Diet   No. Medications  No. Follow up visit  No.  Do you have questions or concerns about your Care? No.  Actions: * If pain score is 4 or above: No action needed, pain <4.

## 2018-05-01 ENCOUNTER — Encounter: Payer: Self-pay | Admitting: Internal Medicine

## 2018-05-02 ENCOUNTER — Encounter: Payer: Self-pay | Admitting: Primary Care

## 2018-05-02 ENCOUNTER — Ambulatory Visit (INDEPENDENT_AMBULATORY_CARE_PROVIDER_SITE_OTHER): Payer: 59 | Admitting: Primary Care

## 2018-05-02 VITALS — BP 132/82 | HR 79 | Ht 72.0 in | Wt 310.6 lb

## 2018-05-02 DIAGNOSIS — G4733 Obstructive sleep apnea (adult) (pediatric): Secondary | ICD-10-CM | POA: Diagnosis not present

## 2018-05-02 NOTE — Progress Notes (Signed)
@Patient  ID: Jermaine James, male    DOB: 10-29-1955, 62 y.o.   MRN: [id number]  Chief Complaint  Patient presents with  . Follow-up    CPAP suplies    Referring provider: Laurey Morale, MD  HPI: Patient 62 year old male, never smoker. PMH obstructive sleep apnea on CPAP. Patient of Dr. Halford Chessman. Last seen on 03/2016. NPSG 2006, AHI 10 with desat 69%. Optimal pressure 14cm.   05/03/2018  Patient presents today for follow-up visit OSA. He is doing well, using CPAP everynight. Reports benefit with use, able to sleep through the night with no snoring. Upcoming vacation abroad, plans to take machine with him. Breathing is ok. Switching new insurance companies, needs a new order for cpap settings. Patient wears full face mask. DME company is [employer].   [employer] download reviewed: 100% compliance  Wearing 90/90 days > 4 hours  CPAP 14cm AHI 1.2 No leaks     Allergies  Allergen Reactions  . No Known Allergies     Immunization History  Administered Date(s) Administered  . Td 10/28/2009    Past Medical History:  Diagnosis Date  . 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 Chronic kidney disease   . 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 Dental crowns present   . Family history of adverse reaction to anesthesia    pt's father has hx. of post-op N/V  . High triglycerides    no current med.  Marland Kitchen History of gout   . History of kidney stones   . Hypertension    states under control with med., has been on med. ~ 33 yr.  . Non-insulin dependent type 2 diabetes mellitus ([place])   . Obesity, Class III, BMI 40-49.9 (morbid obesity) ([place])   . OSA on CPAP   . Osteoarthritis    knees (09/14/2016)  . Sleep apnea   . 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 fenofibrate 160 MG tablet Take 1 tablet (160 mg total) by mouth daily. 90 tablet 3  . lisinopril-hydrochlorothiazide (PRINZIDE,ZESTORETIC) 10-12.5 MG tablet Take 1 tablet by mouth daily. 90 tablet 3  . metFORMIN (GLUCOPHAGE) 500 MG tablet TAKE 1 TABLET (500 MG TOTAL) BY MOUTH 2 (TWO) TIMES DAILY. 180 tablet 3  . azithromycin (ZITHROMAX) 500 MG tablet   0  . ibuprofen (ADVIL,MOTRIN) 600 MG tablet Take 600 mg by mouth as needed.    . ondansetron (ZOFRAN) 4 MG tablet   0   No facility-administered medications prior to visit.       Review of Systems  Review of Systems   Physical Exam  BP 132/82 (BP Location: Right Arm, Cuff Size: Normal)   Pulse 79   Ht 6' (1.829 m)   Wt (!) 310 lb 9.6 oz (140.9 kg)   SpO2 98%   BMI 42.12 kg/m  Physical Exam   Lab Results:  CBC    Component Value Date/Time   WBC 6.7 02/01/2018 0916   RBC 4.81 02/01/2018 0916   HGB 15.1 02/01/2018 0916   HCT 44.1 02/01/2018 0916   PLT 257.0 02/01/2018 0916   MCV 91.7 02/01/2018 0916   MCH 29.5 09/15/2016 0319   MCHC 34.1 02/01/2018 0916  RDW 13.5 02/01/2018 0916   LYMPHSABS 1.8 02/01/2018 0916   MONOABS 0.7 02/01/2018 0916   EOSABS 0.6 02/01/2018 0916   BASOSABS 0.1 02/01/2018 0916    BMET    Component Value Date/Time   NA 138 02/01/2018 0916   K 4.4 02/01/2018 0916   CL 100 02/01/2018 0916   CO2 26 02/01/2018 0916   GLUCOSE 139 (H) 02/01/2018 0916   BUN 16 02/01/2018 0916   CREATININE 0.85 02/01/2018 0916   CALCIUM 9.9 02/01/2018 0916   GFRNONAA >60 09/15/2016 0319   GFRAA >60 09/15/2016 0319    BNP No results found for: BNP  ProBNP No results found for: PROBNP  Imaging: No results found.   Assessment & Plan:   No problem-specific Assessment & Plan notes found for this encounter.     Martyn Ehrich, NP 05/02/2018

## 2018-05-02 NOTE — Patient Instructions (Signed)
DME referral to [facility] for new cpap machine (>64 years old)  New order- CPAP 14cm H20, mask of choice, humidification and supplies   FU in 1 year with Dr. Halford Chessman for OSA review

## 2018-05-03 ENCOUNTER — Encounter: Payer: Self-pay | Admitting: Primary Care

## 2018-05-03 NOTE — Assessment & Plan Note (Addendum)
[employer] download reviewed: 100% compliance  Wearing 90/90 days > 4 hours  CPAP 14cm AHI 1.2 No leaks   - Needs new order for cpap settings, new machine >64 years old and supplies - DME company is [facility] - Reports benefit with use - Do not drive if experiencing excessive daytime fatigue or somnolence - FU in 1 year

## 2018-05-09 MED FILL — LISINOPRIL-HCTZ 10-12.5 MG: 10-12.5 | 90 days supply | Qty: 90 | Fill #1

## 2018-05-09 MED FILL — FENOFIBRATE 160 MG TABLET: 160 | 90 days supply | Qty: 90 | Fill #1

## 2018-05-13 ENCOUNTER — Encounter: Payer: Self-pay | Admitting: Gastroenterology

## 2018-05-13 ENCOUNTER — Encounter (HOSPITAL_COMMUNITY): Payer: Self-pay | Admitting: *Deleted

## 2018-05-13 ENCOUNTER — Other Ambulatory Visit: Payer: Self-pay

## 2018-05-13 ENCOUNTER — Emergency Department (HOSPITAL_COMMUNITY): Payer: 59

## 2018-05-13 ENCOUNTER — Observation Stay (HOSPITAL_COMMUNITY)
Admission: EM | Admit: 2018-05-13 | Discharge: 2018-05-14 | Disposition: A | Discharge status: Home / Self Care | Payer: 59 | Attending: Internal Medicine | Admitting: Internal Medicine

## 2018-05-13 DIAGNOSIS — Z6841 Body Mass Index (BMI) 40.0 and over, adult: Secondary | ICD-10-CM | POA: Insufficient documentation

## 2018-05-13 DIAGNOSIS — E785 Hyperlipidemia, unspecified: Secondary | ICD-10-CM | POA: Insufficient documentation

## 2018-05-13 DIAGNOSIS — M5136 Other intervertebral disc degeneration, lumbar region: Secondary | ICD-10-CM | POA: Insufficient documentation

## 2018-05-13 DIAGNOSIS — N189 Chronic kidney disease, unspecified: Secondary | ICD-10-CM | POA: Diagnosis not present

## 2018-05-13 DIAGNOSIS — G4733 Obstructive sleep apnea (adult) (pediatric): Secondary | ICD-10-CM | POA: Diagnosis not present

## 2018-05-13 DIAGNOSIS — E1122 Type 2 diabetes mellitus with diabetic chronic kidney disease: Secondary | ICD-10-CM | POA: Diagnosis not present

## 2018-05-13 DIAGNOSIS — K760 Fatty (change of) liver, not elsewhere classified: Secondary | ICD-10-CM | POA: Insufficient documentation

## 2018-05-13 DIAGNOSIS — Z7984 Long term (current) use of oral hypoglycemic drugs: Secondary | ICD-10-CM | POA: Insufficient documentation

## 2018-05-13 DIAGNOSIS — Z79899 Other long term (current) drug therapy: Secondary | ICD-10-CM | POA: Insufficient documentation

## 2018-05-13 DIAGNOSIS — K5730 Diverticulosis of large intestine without perforation or abscess without bleeding: Secondary | ICD-10-CM | POA: Diagnosis not present

## 2018-05-13 DIAGNOSIS — Z9989 Dependence on other enabling machines and devices: Secondary | ICD-10-CM | POA: Diagnosis not present

## 2018-05-13 DIAGNOSIS — Z85828 Personal history of other malignant neoplasm of skin: Secondary | ICD-10-CM | POA: Insufficient documentation

## 2018-05-13 DIAGNOSIS — E119 Type 2 diabetes mellitus without complications: Secondary | ICD-10-CM

## 2018-05-13 DIAGNOSIS — Z96653 Presence of artificial knee joint, bilateral: Secondary | ICD-10-CM | POA: Insufficient documentation

## 2018-05-13 DIAGNOSIS — I129 Hypertensive chronic kidney disease with stage 1 through stage 4 chronic kidney disease, or unspecified chronic kidney disease: Secondary | ICD-10-CM | POA: Insufficient documentation

## 2018-05-13 DIAGNOSIS — I10 Essential (primary) hypertension: Secondary | ICD-10-CM | POA: Diagnosis present

## 2018-05-13 DIAGNOSIS — K625 Hemorrhage of anus and rectum: Secondary | ICD-10-CM | POA: Diagnosis not present

## 2018-05-13 DIAGNOSIS — H5462 Unqualified visual loss, left eye, normal vision right eye: Secondary | ICD-10-CM | POA: Diagnosis not present

## 2018-05-13 LAB — COMPREHENSIVE METABOLIC PANEL
ALT: 33 U/L (ref 0–44)
AST: 29 U/L (ref 15–41)
Albumin: 3.9 g/dL (ref 3.5–5.0)
Alkaline Phosphatase: 43 U/L (ref 38–126)
Anion gap: 12 (ref 5–15)
BUN: 17 mg/dL (ref 8–23)
CHLORIDE: 103 mmol/L (ref 98–111)
CO2: 25 mmol/L (ref 22–32)
Calcium: 9.5 mg/dL (ref 8.9–10.3)
Creatinine, Ser: 1.03 mg/dL (ref 0.61–1.24)
Glucose, Bld: 192 mg/dL — ABNORMAL HIGH (ref 70–99)
POTASSIUM: 3.8 mmol/L (ref 3.5–5.1)
SODIUM: 140 mmol/L (ref 135–145)
Total Bilirubin: 0.6 mg/dL (ref 0.3–1.2)
Total Protein: 7.4 g/dL (ref 6.5–8.1)

## 2018-05-13 LAB — TYPE AND SCREEN
ABO/RH(D): A POS
Antibody Screen: NEGATIVE

## 2018-05-13 LAB — GLUCOSE, CAPILLARY: Glucose-Capillary: 138 mg/dL — ABNORMAL HIGH (ref 70–99)

## 2018-05-13 LAB — CBC
HEMATOCRIT: 41.4 % (ref 39.0–52.0)
Hemoglobin: 13.9 g/dL (ref 13.0–17.0)
MCH: 30.9 pg (ref 26.0–34.0)
MCHC: 33.6 g/dL (ref 30.0–36.0)
MCV: 92 fL (ref 78.0–100.0)
Platelets: 279 10*3/uL (ref 150–400)
RBC: 4.5 MIL/uL (ref 4.22–5.81)
RDW: 12.8 % (ref 11.5–15.5)
WBC: 6.1 10*3/uL (ref 4.0–10.5)

## 2018-05-13 MED ORDER — METFORMIN HCL 500 MG PO TABS: 500.0000 mg | ORAL_TABLET | Freq: Two times a day (BID) | ORAL | Status: DC

## 2018-05-13 MED ORDER — LISINOPRIL-HYDROCHLOROTHIAZIDE 10-12.5 MG PO TABS: 1.0000 | ORAL_TABLET | Freq: Every day | ORAL | Status: DC

## 2018-05-13 MED ORDER — ONDANSETRON HCL 4 MG PO TABS: 4.0000 mg | ORAL_TABLET | Freq: Four times a day (QID) | ORAL | Status: DC | PRN

## 2018-05-13 MED ORDER — SODIUM CHLORIDE 0.9 % IV SOLN
8.0000 mg/h | INTRAVENOUS | Status: DC
  Administered 2018-05-14 (×2): 8 mg/h via INTRAVENOUS
  Filled 2018-05-13 (×4): qty 80

## 2018-05-13 MED ORDER — PANTOPRAZOLE SODIUM 40 MG IV SOLR: 40.0000 mg | Freq: Two times a day (BID) | INTRAVENOUS | Status: DC

## 2018-05-13 MED ORDER — FENOFIBRATE 160 MG PO TABS
160.0000 mg | ORAL_TABLET | Freq: Every day | ORAL | Status: DC
  Administered 2018-05-14: 160 mg via ORAL
  Filled 2018-05-13: qty 1

## 2018-05-13 MED ORDER — IOPAMIDOL (ISOVUE-300) INJECTION 61%
100.0000 mL | Freq: Once | INTRAVENOUS | Status: AC | PRN
  Administered 2018-05-13: 100 mL via INTRAVENOUS

## 2018-05-13 MED ORDER — HYDROCHLOROTHIAZIDE 12.5 MG PO CAPS
12.5000 mg | ORAL_CAPSULE | Freq: Every day | ORAL | Status: DC
  Administered 2018-05-14: 12.5 mg via ORAL
  Filled 2018-05-13: qty 1

## 2018-05-13 MED ORDER — ONDANSETRON HCL 4 MG/2ML IJ SOLN: 4.0000 mg | Freq: Four times a day (QID) | INTRAMUSCULAR | Status: DC | PRN

## 2018-05-13 MED ORDER — LISINOPRIL 10 MG PO TABS
10.0000 mg | ORAL_TABLET | Freq: Every day | ORAL | Status: DC
  Administered 2018-05-14: 10 mg via ORAL
  Filled 2018-05-13: qty 1

## 2018-05-13 MED ORDER — SODIUM CHLORIDE 0.9 % IV SOLN
INTRAVENOUS | Status: DC
  Administered 2018-05-13 – 2018-05-14 (×2): via INTRAVENOUS

## 2018-05-13 MED ORDER — SODIUM CHLORIDE 0.9 % IV SOLN
80.0000 mg | Freq: Once | INTRAVENOUS | Status: AC
  Administered 2018-05-13: 80 mg via INTRAVENOUS
  Filled 2018-05-13: qty 80

## 2018-05-13 MED ORDER — INSULIN ASPART 100 UNIT/ML ~~LOC~~ SOLN: 0.0000 [IU] | Freq: Every day | SUBCUTANEOUS | Status: DC

## 2018-05-13 MED ORDER — INSULIN ASPART 100 UNIT/ML ~~LOC~~ SOLN: 0.0000 [IU] | Freq: Three times a day (TID) | SUBCUTANEOUS | Status: DC

## 2018-05-13 MED ORDER — IOPAMIDOL (ISOVUE-300) INJECTION 61%
INTRAVENOUS | Status: AC
  Filled 2018-05-13: qty 100

## 2018-05-13 NOTE — H&P (Signed)
History and Physical   Jermaine James XTG:[id number] DOB: July 25, 1956 DOA: 05/13/2018  Referring MD/NP/PA: Dr. Vivi Martens  PCP: Laurey Morale, MD   Outpatient Specialists: [facility] gastroenterology Dr. Hilarie Fredrickson  Patient coming from: Home  Chief Complaint: Rectal bleed  HPI: Jermaine James is a 62 y.o. male with medical history significant of diverticulosis, diabetes, morbid obesity, hypertension, osteoarthritis, hyperlipidemia who recently had colonoscopy and was found to have diverticular disease.  Patient ate some knots yesterday including solids with lot of peanuts.  He came in today with painless rectal bleed.  Patient has had significant bleeding at home but has remained hemodynamically stable.  Hemoglobin is still at 13 g.  Patient was seen and evaluated in the ER and GI consulted.  At this point recommendation is to admit patient for observation and follow H&H.  Patient to be seen by GI tomorrow.  ED Course: Vitals are stable temperature 98.7 with blood pressure 155/78, pulse 1083, respiratory of 20 and oxygen sats 97% room air.  White count is 6.1 hemoglobin 13.8 and platelets 279.  Sodium 140 potassium 3.8 chloride 103 CO2 25 BUN 79 creatinine 1.03.  Patient is being admitted for observation.  CT abdomen and pelvis showed Fatty infiltration of the liver.  Sigmoid diverticulosis without inflammation.  No acute abnormality seen in the abdomen or pelvis  Review of Systems: As per HPI otherwise 10 point review of systems negative.    Past Medical History:  Diagnosis Date  . 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 Chronic kidney disease   . 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 Dental crowns present   . Family history of adverse reaction to anesthesia    pt's father has hx. of post-op N/V  . High triglycerides    no current med.  Marland Kitchen History of gout   . History of kidney stones   . Hypertension    states under control with med., has been on med. ~ 57 yr.  .  Non-insulin dependent type 2 diabetes mellitus ([place])   . Obesity, Class III, BMI 40-49.9 (morbid obesity) ([place])   . OSA on CPAP   . Osteoarthritis    knees (09/14/2016)  . Sleep apnea   . 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 COLONOSCOPY  08/29/2005   per Dr. Sharlett Iles, diverticulosis only, repeat in 10 yrs   . CYSTOSCOPY  2011   "related to trace blood in urine"  . EYE SURGERY Left 1961   traumatic loss of eye  . INGUINAL HERNIA REPAIR Left   . JOINT REPLACEMENT    . KNEE ARTHROSCOPY Left 1990s?  Marland Kitchen KNEE ARTHROSCOPY WITH MEDIAL MENISECTOMY Right 11/08/2012   Procedure: RIGHT KNEE ARTHROSCOPY WITH PARTIAL MEDIAL AND LATERAL MENISECTOMIES, CHONDROPLASTY;  Surgeon: [name], MD;  Location: [facility];  Service: Orthopedics;  Laterality: Right;  RIGHT KNEE SCOPE WITH PARTIAL MEDIAL AND LATERAL  MENISCECTOMIES  AND CHONDROPLASTY  . KNEE CLOSED REDUCTION Left 08/18/2016   Procedure: LEFT  MANIPULATION KNEE;  Surgeon: [name], MD;  Location: [facility];  Service: Orthopedics;  Laterality: Left;  . LIPOMA EXCISION Left    upper back  . MANIPULATION KNEE JOINT Left 09/14/2016   "he told my wife he did"  . MOHS SURGERY Left 10/2015   "forehead"  . TOTAL KNEE ARTHROPLASTY Left 05/18/2016  Procedure: LEFT TOTAL KNEE ARTHROPLASTY;  Surgeon: [name], MD;  Location: [place];  Service: Orthopedics;  Laterality: Left;  . TOTAL KNEE ARTHROPLASTY Right 09/14/2016  . TOTAL KNEE ARTHROPLASTY Right 09/14/2016   Procedure: TOTAL KNEE ARTHROPLASTY MANIPULATION LEFT KNEE;  Surgeon: [name], MD;  Location: [facility];  Service: Orthopedics;  Laterality: Right;     reports that he has never smoked. He has never used smokeless tobacco. He reports that he drinks about 3.0 - 4.0 standard drinks of alcohol per week. He reports that he does not use drugs.  Allergies  Allergen Reactions    . No Known Allergies     Family History  Problem Relation Age of Onset  . Anesthesia problems Father        post-op N/V  . Colon cancer Neg Hx   . Esophageal cancer Neg Hx   . Rectal cancer Neg Hx   . Stomach cancer Neg Hx      Prior to Admission medications   Medication Sig Start Date End Date Taking? Authorizing Provider  fenofibrate 160 MG tablet Take 1 tablet (160 mg total) by mouth daily. 02/05/18  Yes Laurey Morale, MD  lisinopril-hydrochlorothiazide (PRINZIDE,ZESTORETIC) 10-12.5 MG tablet Take 1 tablet by mouth daily. 02/01/18  Yes Laurey Morale, MD  metFORMIN (GLUCOPHAGE) 500 MG tablet TAKE 1 TABLET (500 MG TOTAL) BY MOUTH 2 (TWO) TIMES DAILY. Patient taking differently: Take 500 mg by mouth 2 (two) times daily with a meal.  02/01/18  Yes Laurey Morale, MD    Physical Exam: Vitals:   05/13/18 1915 05/13/18 2100 05/13/18 2159 05/13/18 2203  BP: 120/79 131/76 138/90   Pulse: 94 89 89   Resp: 20 16 16    Temp: 98.4 F (36.9 C)  98.7 F (37.1 C)   TempSrc: Oral  Oral   SpO2: 96% 96% 96%   Weight:    (!) 138.8 kg  Height:    6' (1.829 m)      Constitutional: NAD, calm, comfortable, morbidly obese with no acute distress Vitals:   05/13/18 1915 05/13/18 2100 05/13/18 2159 05/13/18 2203  BP: 120/79 131/76 138/90   Pulse: 94 89 89   Resp: 20 16 16    Temp: 98.4 F (36.9 C)  98.7 F (37.1 C)   TempSrc: Oral  Oral   SpO2: 96% 96% 96%   Weight:    (!) 138.8 kg  Height:    6' (1.829 m)   Eyes: PERRL, lids and conjunctivae normal ENMT: Mucous membranes are moist. Posterior pharynx clear of any exudate or lesions.Normal dentition.  Neck: normal, supple, no masses, no thyromegaly Respiratory: clear to auscultation bilaterally, no wheezing, no crackles. Normal respiratory effort. No accessory muscle use.  Cardiovascular: Regular rate and rhythm, no murmurs / rubs / gallops. No extremity edema. 2+ pedal pulses. No carotid bruits.  Abdomen: no tenderness, no masses  palpated. No hepatosplenomegaly. Bowel sounds positive.  Musculoskeletal: no clubbing / cyanosis. No joint deformity upper and lower extremities. Good ROM, no contractures. Normal muscle tone.  Skin: no rashes, lesions, ulcers. No induration Neurologic: CN 2-12 grossly intact. Sensation intact, DTR normal. Strength 5/5 in all 4.  Psychiatric: Normal judgment and insight. Alert and oriented x 3. Normal mood.     Labs on Admission: I have personally reviewed following labs and imaging studies  CBC: Recent Labs  Lab 05/13/18 1804  WBC 6.1  HGB 13.9  HCT 41.4  MCV 92.0  PLT 279   Basic  Metabolic Panel: Recent Labs  Lab 05/13/18 1804  NA 140  K 3.8  CL 103  CO2 25  GLUCOSE 192*  BUN 17  CREATININE 1.03  CALCIUM 9.5   GFR: Estimated Creatinine Clearance: 104.6 mL/min (by C-G formula based on SCr of 1.03 mg/dL). Liver Function Tests: Recent Labs  Lab 05/13/18 1804  AST 29  ALT 33  ALKPHOS 43  BILITOT 0.6  PROT 7.4  ALBUMIN 3.9   No results for input(s): LIPASE, AMYLASE in the last 168 hours. No results for input(s): AMMONIA in the last 168 hours. Coagulation Profile: No results for input(s): INR, PROTIME in the last 168 hours. Cardiac Enzymes: No results for input(s): CKTOTAL, CKMB, CKMBINDEX, TROPONINI in the last 168 hours. BNP (last 3 results) No results for input(s): PROBNP in the last 8760 hours. HbA1C: No results for input(s): HGBA1C in the last 72 hours. CBG: Recent Labs  Lab 05/13/18 2206  GLUCAP 138*   Lipid Profile: No results for input(s): CHOL, HDL, LDLCALC, TRIG, CHOLHDL, LDLDIRECT in the last 72 hours. Thyroid Function Tests: No results for input(s): TSH, T4TOTAL, FREET4, T3FREE, THYROIDAB in the last 72 hours. Anemia Panel: No results for input(s): VITAMINB12, FOLATE, FERRITIN, TIBC, IRON, RETICCTPCT in the last 72 hours. Urine analysis:    Component Value Date/Time   COLORURINE YELLOW 05/06/2016 [place] 05/06/2016 1047    LABSPEC 1.015 05/06/2016 1047   PHURINE 7.0 05/06/2016 1047   GLUCOSEU NEGATIVE 05/06/2016 1047   GLUCOSEU NEGATIVE 10/21/2009 0810   HGBUR NEGATIVE 05/06/2016 1047   HGBUR trace-lysed 07/28/2009 0809   BILIRUBINUR Negative 02/01/2018 1000   KETONESUR NEGATIVE 05/06/2016 1047   PROTEINUR Negative 02/01/2018 1000   PROTEINUR NEGATIVE 05/06/2016 1047   UROBILINOGEN 0.2 02/01/2018 1000   UROBILINOGEN 0.2 09/18/2014 2153   NITRITE Negative 02/01/2018 1000   NITRITE NEGATIVE 05/06/2016 1047   LEUKOCYTESUR Negative 02/01/2018 1000   Sepsis Labs: @LABRCNTIP (procalcitonin:4,lacticidven:4) )No results found for this or any previous visit (from the past 240 hour(s)).   Radiological Exams on Admission: Ct Abdomen Pelvis W Contrast  Result Date: 05/13/2018 CLINICAL DATA:  Rectal bleeding. EXAM: CT ABDOMEN AND PELVIS WITH CONTRAST TECHNIQUE: Multidetector CT imaging of the abdomen and pelvis was performed using the standard protocol following bolus administration of intravenous contrast. CONTRAST:  165mL ISOVUE-300 IOPAMIDOL (ISOVUE-300) INJECTION 61% COMPARISON:  CT scan of September 18, 2014. FINDINGS: Lower chest: No acute abnormality. Hepatobiliary: No gallstones are noted. Fatty infiltration of the liver is noted. No biliary dilatation is noted. Pancreas: Unremarkable. No pancreatic ductal dilatation or surrounding inflammatory changes. Spleen: Normal in size without focal abnormality. Adrenals/Urinary Tract: Adrenal glands appear normal. Stable right renal cyst. No hydronephrosis or renal obstruction is noted. No renal or ureteral calculi are noted. Urinary bladder is unremarkable. Stomach/Bowel: Stomach is within normal limits. Appendix appears normal. No evidence of bowel wall thickening, distention, or inflammatory changes. Sigmoid diverticulosis is noted without inflammation. Vascular/Lymphatic: No significant vascular findings are present. No enlarged abdominal or pelvic lymph nodes.  Reproductive: Prostate is unremarkable. Other: No abdominal wall hernia or abnormality. No abdominopelvic ascites. Musculoskeletal: Multilevel degenerative disc disease is noted in the lumbar spine. No acute abnormality is noted. IMPRESSION: Fatty infiltration of the liver. Sigmoid diverticulosis without inflammation. No acute abnormality seen in the abdomen or pelvis. Electronically Signed   By: Marijo Conception, M.D.   On: 05/13/2018 20:33    Assessment/Plan Principal Problem:   Rectal bleed Active Problems:   Type 2 diabetes mellitus ([place])  Essential hypertension   OSA (obstructive sleep apnea)     #1 rectal bleed: Patient will be admitted for observation.  Serial CBCs, Protonix to be continued, IV fluids.  Type and crossmatch on hold PRBCs.  GI to be consulted.  If patient continues to bleed or hemoglobin drops significantly may need intervention.  Otherwise mainly observation.  Suspect this to be purely diverticular bleed.  The bleeding has already stopped.  #2 diabetes type 2: Metformin will be held and sliding scale insulin will be continued with.  #3 obstructive sleep apnea: The patient has CPAP from home and will continue with CPAP in the hospital.  #4 essential hypertension: Blood pressure appears controlled.  Continue home regimen and watch especially in the setting of GI bleed.   DVT prophylaxis: SCD Code Status: Full code Family Communication: Wife at bedside Disposition Plan: Home Consults called: Dr. Collene Mares for gastroenterology Admission status: Observation  Severity of Illness: The appropriate patient status for this patient is OBSERVATION. Observation status is judged to be reasonable and necessary in order to provide the required intensity of service to ensure the patient's safety. The patient's presenting symptoms, physical exam findings, and initial radiographic and laboratory data in the context of their medical condition is felt to place them at decreased risk for  further clinical deterioration. Furthermore, it is anticipated that the patient will be medically stable for discharge from the hospital within 2 midnights of admission. The following factors support the patient status of observation.   " The patient's presenting symptoms include rectal bleed. " The physical exam findings include guaiac positive stool. " The initial radiographic and laboratory data are hemoglobin of 13.     Barbette Merino MD [facility] Pager [phone]  If 7PM-7AM, please contact night-coverage [email or web address] Password [facility]  05/13/2018, 10:24 PM

## 2018-05-13 NOTE — ED Notes (Signed)
ED Provider at bedside. 

## 2018-05-13 NOTE — ED Provider Notes (Signed)
[facility] DEPT Provider Note   CSN: [id number] Arrival date & time: 05/13/18  1659     History   Chief Complaint Chief Complaint  Patient presents with  . Rectal Bleeding    HPI Jermaine James is a 62 y.o. male.  62 year old male presents with acute onset of bright red blood per rectum x24 hours.  Patient had colonoscopy done 2 weeks ago which according to him showed internal as well as external hemorrhoids.  States that he has had some abdominal cramping but denies any severe abdominal discomfort.  No fever chills.  No emesis noted.  Notes blood mixed in stool as well as in the toilet bowl.  No prior history of same.  No treatment used prior to arrival.  Symptoms worse with defecating.     Past Medical History:  Diagnosis Date  . 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 Chronic kidney disease   . 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 Dental crowns present   . Family history of adverse reaction to anesthesia    pt's father has hx. of post-op N/V  . High triglycerides    no current med.  Marland Kitchen History of gout   . History of kidney stones   . Hypertension    states under control with med., has been on med. ~ 63 yr.  . Non-insulin dependent type 2 diabetes mellitus ([place])   . 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 OSA on CPAP   . Osteoarthritis    knees (09/14/2016)  . Sleep apnea   . 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 Primary localized osteoarthritis of right knee 09/14/2016  . S/P total knee arthroplasty 05/18/2016  . OSA (obstructive sleep apnea) 07/03/2012  . Cellulitis 07/01/2012  . Primary osteoarthritis of left knee 10/28/2009  . MICROSCOPIC HEMATURIA 08/04/2009  . ACHILLES TENDINITIS 07/02/2008  . GOUT 10/02/2007  . PSA, INCREASED 08/13/2007  . Type 2 diabetes mellitus ([place]) 04/20/2007  . HYPERLIPIDEMIA 04/20/2007  .  SKIN TAG 04/20/2007  . Essential hypertension 03/26/2007  . ALLERGIC RHINITIS 03/26/2007    Past Surgical History:  Procedure Laterality Date  . COLONOSCOPY  08/29/2005   per Dr. Sharlett Iles, diverticulosis only, repeat in 10 yrs   . CYSTOSCOPY  2011   "related to trace blood in urine"  . EYE SURGERY Left 1961   traumatic loss of eye  . INGUINAL HERNIA REPAIR Left   . JOINT REPLACEMENT    . KNEE ARTHROSCOPY Left 1990s?  Marland Kitchen KNEE ARTHROSCOPY WITH MEDIAL MENISECTOMY Right 11/08/2012   Procedure: RIGHT KNEE ARTHROSCOPY WITH PARTIAL MEDIAL AND LATERAL MENISECTOMIES, CHONDROPLASTY;  Surgeon: [name], MD;  Location: [facility];  Service: Orthopedics;  Laterality: Right;  RIGHT KNEE SCOPE WITH PARTIAL MEDIAL AND LATERAL  MENISCECTOMIES  AND CHONDROPLASTY  . KNEE CLOSED REDUCTION Left 08/18/2016   Procedure: LEFT  MANIPULATION KNEE;  Surgeon: [name], MD;  Location: [facility];  Service: Orthopedics;  Laterality: Left;  . LIPOMA EXCISION Left    upper back  . MANIPULATION KNEE JOINT Left 09/14/2016   "he told my wife he did"  . MOHS SURGERY Left 10/2015   "forehead"  . TOTAL KNEE ARTHROPLASTY Left 05/18/2016   Procedure: LEFT TOTAL KNEE ARTHROPLASTY;  Surgeon: [name], MD;  Location:  [facility];  Service: Orthopedics;  Laterality: Left;  . TOTAL KNEE ARTHROPLASTY Right 09/14/2016  . TOTAL KNEE ARTHROPLASTY Right 09/14/2016   Procedure: TOTAL KNEE ARTHROPLASTY MANIPULATION LEFT KNEE;  Surgeon: [name], MD;  Location: [facility];  Service: Orthopedics;  Laterality: Right;        Home Medications    Prior to Admission medications   Medication Sig Start Date End Date Taking? Authorizing Provider  azithromycin (ZITHROMAX) 500 MG tablet  04/24/18   [provider]  fenofibrate 160 MG tablet Take 1 tablet (160 mg total) by mouth daily. 02/05/18   Laurey Morale, MD  ibuprofen (ADVIL,MOTRIN) 600 MG tablet Take 600 mg by mouth as needed.     [provider]  lisinopril-hydrochlorothiazide (PRINZIDE,ZESTORETIC) 10-12.5 MG tablet Take 1 tablet by mouth daily. 02/01/18   Laurey Morale, MD  metFORMIN (GLUCOPHAGE) 500 MG tablet TAKE 1 TABLET (500 MG TOTAL) BY MOUTH 2 (TWO) TIMES DAILY. 02/01/18   Laurey Morale, MD  ondansetron [facility]) 4 MG tablet  04/24/18   [provider]    Family History Family History  Problem Relation Age of Onset  . Anesthesia problems Father        post-op N/V  . Colon cancer Neg Hx   . Esophageal cancer Neg Hx   . Rectal cancer Neg Hx   . Stomach cancer Neg Hx     Social History Social History   Tobacco Use  . Smoking status: Never Smoker  . Smokeless tobacco: Never Used  Substance Use Topics  . Alcohol use: Yes    Alcohol/week: 3.0 - 4.0 standard drinks    Types: 1 - 2 Standard drinks or equivalent, 1 Cans of beer, 1 Glasses of wine per week  . Drug use: No     Allergies   No known allergies   Review of Systems Review of Systems  All other systems reviewed and are negative.    Physical Exam Updated Vital Signs BP (!) 155/78 (BP Location: Right Arm)   Pulse (!) 103   Temp 98.5 F (36.9 C) (Oral)   Resp 16   Ht 1.829 m (6')   Wt (!) 139.3 kg   SpO2 97%   BMI 41.64 kg/m   Physical Exam  Constitutional: He is oriented to person, place, and time. He appears well-developed and well-nourished.  Non-toxic appearance. No distress.  HENT:  Head: Normocephalic and atraumatic.  Eyes: Pupils are equal, round, and reactive to light. Conjunctivae, EOM and lids are normal.  Neck: Normal range of motion. Neck supple. No tracheal deviation present. No thyroid mass present.  Cardiovascular: Normal rate, regular rhythm and normal heart sounds. Exam reveals no gallop.  No murmur heard. Pulmonary/Chest: Effort normal and breath sounds normal. No stridor. No respiratory distress. He has no decreased breath sounds. He has no wheezes. He has no rhonchi. He has no rales.    Abdominal: Soft. Normal appearance and bowel sounds are normal. He exhibits no distension. There is no tenderness. There is no rigidity, no rebound, no guarding and no CVA tenderness.  Genitourinary: Rectal exam shows external hemorrhoid. Rectal exam shows no mass and no tenderness.  Genitourinary Comments: Gross blood per rectum noted  Musculoskeletal: Normal range of motion. He exhibits no edema or tenderness.  Neurological: He is alert and oriented to person, place, and time. He has normal strength. No cranial nerve deficit or sensory deficit. GCS eye subscore is 4. GCS verbal subscore is 5. GCS motor subscore  is 6.  Skin: Skin is warm and dry. No abrasion and no rash noted.  Psychiatric: He has a normal mood and affect. His speech is normal and behavior is normal.  Nursing note and vitals reviewed.    ED Treatments / Results  Labs (all labs ordered are listed, but only abnormal results are displayed) Labs Reviewed  COMPREHENSIVE METABOLIC PANEL  CBC  POC OCCULT BLOOD, ED  TYPE AND SCREEN    EKG None  Radiology No results found.  Procedures Procedures (including critical care time)  Medications Ordered in ED Medications - No data to display   Initial Impression / Assessment and Plan / ED Course  I have reviewed the triage vital signs and the nursing notes.  Pertinent labs & imaging results that were available during my care of the patient were reviewed by me and considered in my medical decision making (see chart for details).     Patient's hemoglobin is stable here.  Abdominal CT shows diverticulosis.  Discussed with Dr. Collene Mares who recommends observation overnight.  Will admit to the hospitalist service  Final Clinical Impressions(s) / ED Diagnoses   Final diagnoses:  None    ED Discharge Orders    None       Lacretia Leigh, MD 05/13/18 2112

## 2018-05-13 NOTE — ED Notes (Signed)
Pt aware of plans for admission

## 2018-05-13 NOTE — ED Notes (Signed)
Patient transported to CT 

## 2018-05-13 NOTE — ED Notes (Signed)
Bed: WA17 Expected date:  Expected time:  Means of arrival:  Comments: Hold 

## 2018-05-13 NOTE — ED Triage Notes (Signed)
Rectal bleeding started yesterday, bright red bleeding with BM. Continued for a little while then stopped. Today has diarrhea with increased bleeding. Was told he had hemorrhoids during a colonoscopy Sept 12th.

## 2018-05-14 DIAGNOSIS — K5730 Diverticulosis of large intestine without perforation or abscess without bleeding: Secondary | ICD-10-CM | POA: Diagnosis not present

## 2018-05-14 DIAGNOSIS — N189 Chronic kidney disease, unspecified: Secondary | ICD-10-CM | POA: Diagnosis not present

## 2018-05-14 DIAGNOSIS — E1122 Type 2 diabetes mellitus with diabetic chronic kidney disease: Secondary | ICD-10-CM | POA: Diagnosis not present

## 2018-05-14 DIAGNOSIS — K641 Second degree hemorrhoids: Secondary | ICD-10-CM

## 2018-05-14 DIAGNOSIS — I10 Essential (primary) hypertension: Secondary | ICD-10-CM

## 2018-05-14 DIAGNOSIS — K760 Fatty (change of) liver, not elsewhere classified: Secondary | ICD-10-CM | POA: Diagnosis not present

## 2018-05-14 DIAGNOSIS — I129 Hypertensive chronic kidney disease with stage 1 through stage 4 chronic kidney disease, or unspecified chronic kidney disease: Secondary | ICD-10-CM | POA: Diagnosis not present

## 2018-05-14 DIAGNOSIS — G4733 Obstructive sleep apnea (adult) (pediatric): Secondary | ICD-10-CM | POA: Diagnosis not present

## 2018-05-14 DIAGNOSIS — E785 Hyperlipidemia, unspecified: Secondary | ICD-10-CM | POA: Diagnosis not present

## 2018-05-14 DIAGNOSIS — H5462 Unqualified visual loss, left eye, normal vision right eye: Secondary | ICD-10-CM | POA: Diagnosis not present

## 2018-05-14 DIAGNOSIS — K625 Hemorrhage of anus and rectum: Secondary | ICD-10-CM | POA: Diagnosis not present

## 2018-05-14 LAB — COMPREHENSIVE METABOLIC PANEL
ALK PHOS: 38 U/L (ref 38–126)
ALT: 30 U/L (ref 0–44)
AST: 26 U/L (ref 15–41)
Albumin: 3.8 g/dL (ref 3.5–5.0)
Anion gap: 8 (ref 5–15)
BUN: 13 mg/dL (ref 8–23)
CALCIUM: 8.7 mg/dL — AB (ref 8.9–10.3)
CHLORIDE: 107 mmol/L (ref 98–111)
CO2: 26 mmol/L (ref 22–32)
CREATININE: 0.94 mg/dL (ref 0.61–1.24)
GFR calc non Af Amer: >60 mL/min (ref >60)
Glucose, Bld: 144 mg/dL — ABNORMAL HIGH (ref 70–99)
Potassium: 3.8 mmol/L (ref 3.5–5.1)
SODIUM: 141 mmol/L (ref 135–145)
Total Bilirubin: 0.5 mg/dL (ref 0.3–1.2)
Total Protein: 7.1 g/dL (ref 6.5–8.1)

## 2018-05-14 LAB — CBC
HCT: 40.9 % (ref 39.0–52.0)
Hemoglobin: 13.3 g/dL (ref 13.0–17.0)
MCH: 30 pg (ref 26.0–34.0)
MCHC: 32.5 g/dL (ref 30.0–36.0)
MCV: 92.3 fL (ref 78.0–100.0)
PLATELETS: 268 10*3/uL (ref 150–400)
RBC: 4.43 MIL/uL (ref 4.22–5.81)
RDW: 13 % (ref 11.5–15.5)
WBC: 6.3 10*3/uL (ref 4.0–10.5)

## 2018-05-14 LAB — ABO/RH: ABO/RH(D): A POS

## 2018-05-14 LAB — HIV ANTIBODY (ROUTINE TESTING W REFLEX): HIV SCREEN 4TH GENERATION: NONREACTIVE

## 2018-05-14 LAB — GLUCOSE, CAPILLARY
Glucose-Capillary: 140 mg/dL — ABNORMAL HIGH (ref 70–99)
Glucose-Capillary: 168 mg/dL — ABNORMAL HIGH (ref 70–99)

## 2018-05-14 MED ORDER — HYDROCORTISONE ACETATE 25 MG RE SUPP: 25.0000 mg | Freq: Two times a day (BID) | RECTAL | 0 refills | Status: DC

## 2018-05-14 MED ORDER — HYDROCORTISONE ACETATE 25 MG RE SUPP
25.0000 mg | Freq: Two times a day (BID) | RECTAL | Status: DC
  Filled 2018-05-14: qty 1

## 2018-05-14 MED FILL — HYDROCORTISONE ACETATE 25 M: 25 | 6 days supply | Qty: 12 | Fill #0

## 2018-05-14 NOTE — Plan of Care (Signed)
Plan of care 

## 2018-05-14 NOTE — Discharge Instructions (Signed)
Rectal Bleeding °Rectal bleeding is when blood comes out of the opening of the butt (anus). People with this kind of bleeding may notice bright red blood in their underwear or in the toilet after they poop (have a bowel movement). They may also have dark red or black poop (stool). Rectal bleeding is often a sign that something is wrong. It needs to be checked by a doctor. °Follow these instructions at home: °Watch for any changes in your condition. Take these actions to help with bleeding and discomfort: °· Eat a diet that is high in fiber. This will keep your poop soft so it is easier for you to poop without pushing too hard. Ask your doctor to tell you what foods and drinks are high in fiber. °· Drink enough fluid to keep your pee (urine) clear or pale yellow. This also helps keep your poop soft. °· Try taking a warm bath. This may help with pain. °· Keep all follow-up visits as told by your doctor. This is important. ° °Get help right away if: °· You have new bleeding. °· You have more bleeding than before. °· You have black or dark red poop. °· You throw up (vomit) blood or something that looks like coffee grounds. °· You have pain or tenderness in your belly (abdomen). °· You have a fever. °· You feel weak. °· You feel sick to your stomach (nauseous). °· You pass out (faint). °· You have very bad pain in your butt. °· You cannot poop. °This information is not intended to replace advice given to you by your health care provider. Make sure you discuss any questions you have with your health care provider. °Document Released: 04/13/2011 Document Revised: 01/07/2016 Document Reviewed: 09/27/2015 °Elsevier Interactive Patient Education © 2018 [employer] ° °

## 2018-05-14 NOTE — Discharge Summary (Signed)
Discharge Summary  Jermaine James FTD:[id number] DOB: 08/02/1956  PCP: Laurey Morale, MD  Admit date: 05/13/2018 Discharge date: 05/14/2018  Time spent: 25 minutes  Recommendations for Outpatient Follow-up:  1. Follow-up with GI 2. Follow-up with your PCP 3. Take your medications as prescribed  Discharge Diagnoses:  Active Hospital Problems   Diagnosis Date Noted  . Rectal bleed 05/13/2018  . OSA (obstructive sleep apnea) 07/03/2012  . Type 2 diabetes mellitus ([place]) 04/20/2007  . Essential hypertension 03/26/2007    Resolved Hospital Problems  No resolved problems to display.    Discharge Condition: Stable  Diet recommendation: Diet as recommended by GI  Vitals:   05/14/18 0405 05/14/18 1014  BP: 107/62 139/71  Pulse: 76 88  Resp: 16   Temp: 98 F (36.7 C)   SpO2: 98%     History of present illness:  Jermaine James is a 62 y.o. male with medical history significant of diverticulosis, diabetes, morbid obesity, hypertension, osteoarthritis, hyperlipidemia who recently had colonoscopy and was found to have diverticular disease.  Patient ate some knots yesterday including solids with lot of peanuts.  He came in today with painless rectal bleed.  Patient has had significant bleeding at home but has remained hemodynamically stable.  Hemoglobin is still at 13 g.  Patient was seen and evaluated in the ER and GI consulted.  At this point recommendation is to admit patient for observation and follow H&H.  Patient to be seen by GI.   05/14/2018: Patient seen and examined at his bedside.  Denies any recurrent overt GI bleed.  Seen by GI this morning.  Last colonoscopy done in April 26, 2018.  Patient can be discharged from a GI standpoint.  Patient has no new complaints.  On the day of discharge, the patient was hemodynamically stable.  He will need to follow-up with his PCP and GI post hospitalization.  Hospital Course:  Principal Problem:   Rectal bleed Active  Problems:   Type 2 diabetes mellitus ([place])   Essential hypertension   OSA (obstructive sleep apnea)  #1  Resolved rectal bleed: Last colonoscopy was on April 26, 2018.  GI saw him this morning and suspects bleeding is hemorrhoidal.  GI recommendations: -Anusol HC suppositories twice daily for 5 days -Benefiber 3 times daily if can tolerate -Increase water intake to 10 to 12 glasses a day -Patient leaving for [place] this evening around 7 PM.  He has a layover in [place] for several hours.  Obviously it would be ideal to wait a day or so to make sure bleeding stopped but his son is getting married.  #2 diabetes type 2:  Last hemoglobin A1c 6.8 on 02/01/2018.  Blood glucose is stable.  Resume metformin.  #3 obstructive sleep apnea: Continue CPAP  #4 essential hypertension:  Well controlled.  Resume lisinopril and HCTZ.      Procedures:  None  Consultations:  GI  Discharge Exam: BP 139/71   Pulse 88   Temp 98 F (36.7 C) (Oral)   Resp 16   Ht 6' (1.829 m)   Wt (!) 138.8 kg   SpO2 98%   BMI 41.51 kg/m  . General: 62 y.o. year-old male well developed well nourished in no acute distress.  Alert and oriented x3. . Cardiovascular: Regular rate and rhythm with no rubs or gallops.  No thyromegaly or JVD noted.   Marland Kitchen Respiratory: Clear to auscultation with no wheezes or rales. Good inspiratory effort. . Abdomen: Soft nontender nondistended with  normal bowel sounds x4 quadrants. . Musculoskeletal: No lower extremity edema. 2/4 pulses in all 4 extremities. . Skin: No ulcerative lesions noted or rashes, . Psychiatry: Mood is appropriate for condition and setting  Discharge Instructions You were cared for by a hospitalist during your hospital stay. If you have any questions about your discharge medications or the care you received while you were in the hospital after you are discharged, you can call the unit and asked to speak with the hospitalist on call if the hospitalist  that took care of you is not available. Once you are discharged, your primary care physician will handle any further medical issues. Please note that NO REFILLS for any discharge medications will be authorized once you are discharged, as it is imperative that you return to your primary care physician (or establish a relationship with a primary care physician if you do not have one) for your aftercare needs so that they can reassess your need for medications and monitor your lab values.   Allergies as of 05/14/2018      Reactions   No Known Allergies       Medication List    TAKE these medications   fenofibrate 160 MG tablet Take 1 tablet (160 mg total) by mouth daily.   hydrocortisone 25 MG suppository Commonly known as:  ANUSOL-HC Place 1 suppository (25 mg total) rectally 2 (two) times daily.   lisinopril-hydrochlorothiazide 10-12.5 MG tablet Commonly known as:  PRINZIDE,ZESTORETIC Take 1 tablet by mouth daily.   metFORMIN 500 MG tablet Commonly known as:  GLUCOPHAGE TAKE 1 TABLET (500 MG TOTAL) BY MOUTH 2 (TWO) TIMES DAILY. What changed:    how much to take  how to take this  when to take this  additional instructions      Allergies  Allergen Reactions  . No Known Allergies    Follow-up Information    Laurey Morale, MD. Call in 1 day(s).   Specialty:  Family Medicine Why:  Please call for post hospital follow-up appointment. Contact information: [place] [address] [phone]        Jerene Bears, MD. Call in 1 day(s).   Specialty:  Gastroenterology Why:  Please call for post hospital follow-up appointment. Contact information: [address] [place] [address] [phone]            The results of significant diagnostics from this hospitalization (including imaging, microbiology, ancillary and laboratory) are listed below for reference.    Significant Diagnostic Studies: Ct Abdomen Pelvis W Contrast  Result  Date: 05/13/2018 CLINICAL DATA:  Rectal bleeding. EXAM: CT ABDOMEN AND PELVIS WITH CONTRAST TECHNIQUE: Multidetector CT imaging of the abdomen and pelvis was performed using the standard protocol following bolus administration of intravenous contrast. CONTRAST:  168mL ISOVUE-300 IOPAMIDOL (ISOVUE-300) INJECTION 61% COMPARISON:  CT scan of September 18, 2014. FINDINGS: Lower chest: No acute abnormality. Hepatobiliary: No gallstones are noted. Fatty infiltration of the liver is noted. No biliary dilatation is noted. Pancreas: Unremarkable. No pancreatic ductal dilatation or surrounding inflammatory changes. Spleen: Normal in size without focal abnormality. Adrenals/Urinary Tract: Adrenal glands appear normal. Stable right renal cyst. No hydronephrosis or renal obstruction is noted. No renal or ureteral calculi are noted. Urinary bladder is unremarkable. Stomach/Bowel: Stomach is within normal limits. Appendix appears normal. No evidence of bowel wall thickening, distention, or inflammatory changes. Sigmoid diverticulosis is noted without inflammation. Vascular/Lymphatic: No significant vascular findings are present. No enlarged abdominal or pelvic lymph nodes. Reproductive:  Prostate is unremarkable. Other: No abdominal wall hernia or abnormality. No abdominopelvic ascites. Musculoskeletal: Multilevel degenerative disc disease is noted in the lumbar spine. No acute abnormality is noted. IMPRESSION: Fatty infiltration of the liver. Sigmoid diverticulosis without inflammation. No acute abnormality seen in the abdomen or pelvis. Electronically Signed   By: Marijo Conception, M.D.   On: 05/13/2018 20:33    Microbiology: No results found for this or any previous visit (from the past 240 hour(s)).   Labs: Basic Metabolic Panel: Recent Labs  Lab 05/13/18 1804 05/14/18 0520  NA 140 141  K 3.8 3.8  CL 103 107  CO2 25 26  GLUCOSE 192* 144*  BUN 17 13  CREATININE 1.03 0.94  CALCIUM 9.5 8.7*   Liver Function  Tests: Recent Labs  Lab 05/13/18 1804 05/14/18 0520  AST 29 26  ALT 33 30  ALKPHOS 43 38  BILITOT 0.6 0.5  PROT 7.4 7.1  ALBUMIN 3.9 3.8   No results for input(s): LIPASE, AMYLASE in the last 168 hours. No results for input(s): AMMONIA in the last 168 hours. CBC: Recent Labs  Lab 05/13/18 1804 05/14/18 0520  WBC 6.1 6.3  HGB 13.9 13.3  HCT 41.4 40.9  MCV 92.0 92.3  PLT 279 268   Cardiac Enzymes: No results for input(s): CKTOTAL, CKMB, CKMBINDEX, TROPONINI in the last 168 hours. BNP: BNP (last 3 results) No results for input(s): BNP in the last 8760 hours.  ProBNP (last 3 results) No results for input(s): PROBNP in the last 8760 hours.  CBG: Recent Labs  Lab 05/13/18 2206 05/14/18 1105  GLUCAP 138* 168*       Signed:  Kayleen Memos, MD [facility] 05/14/2018, 11:50 AM

## 2018-05-14 NOTE — Consult Note (Addendum)
Referring Provider: [facility]   Primary Care Physician:  Laurey Morale, MD Primary Gastroenterologist: Zenovia Jarred, MD  Reason for Consultation:  Rectal bleeding   ASSESSMENT AND PLAN:    62 year old male admitted with low volume, painless rectal bleeding.  Over the weekend patient began having loose, nonbloody stools.  Blood was seen on tissue when wiping.  Despite history of diverticulosis and colonoscopy with polypectomy 2 weeks ago, I suspect bleeding is hemorrhoidal.  -Anusol HC suppositories twice daily for 5 days -Benefiber 3 times daily if can tolerate -Increase water intake to 10 to 12 glasses a day -Patient leaving for [place] this evening around 7 PM.  He has a layover in [place] for several hours.  Obviously it would be ideal to wait a day or so to make sure bleeding stopped but his son is getting married.    HPI: Jermaine James is a 62 y.o. male who on Saturday began having several loose stools.  The stool did not contain blood but patient saw blood tissue when he wiped.  He had no associated rectal pain but does admit to some mild, vague LLQ discomfort.  The onset of rectal bleeding was not associated with straining, again his stools were actually loose which is unusual for him.  To associated nausea or vomiting.  His last bowel movement/rectal bleeding was yesterday around 4 PM.  Hemoglobin has remained in the normal range without a significant drop.  He does not take blood thinners, he sometimes takes ibuprofen.   Patient had a colonoscopy with polypectomy  x2 on 04/26/2018.  Path c/w serrated polyps without dysplasia  Colonoscopy 04/26/18:  One 6 mm polyp in the ascending colon, removed with a cold snare. Resected and retrieved. - One 4 mm polyp in the transverse colon, removed with a cold snare. Resected and retrieved. - Moderate diverticulosis from ascending colon to sigmoid colon. - External and internal hemorrhoids  Past Medical History:  Diagnosis Date  .  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 Chronic kidney disease   . 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 Dental crowns present   . Family history of adverse reaction to anesthesia    pt's father has hx. of post-op N/V  . High triglycerides    no current med.  Marland Kitchen History of gout   . History of kidney stones   . Hypertension    states under control with med., has been on med. ~ 80 yr.  . Non-insulin dependent type 2 diabetes mellitus ([place])   . Obesity, Class III, BMI 40-49.9 (morbid obesity) ([place])   . OSA on CPAP   . Osteoarthritis    knees (09/14/2016)  . Sleep apnea   . 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 COLONOSCOPY  08/29/2005   per Dr. Sharlett Iles, diverticulosis only, repeat in 10 yrs   . CYSTOSCOPY  2011   "related to trace blood in urine"  . EYE SURGERY Left 1961   traumatic loss of eye  . INGUINAL HERNIA REPAIR Left   . JOINT REPLACEMENT    . KNEE ARTHROSCOPY Left 1990s?  Marland Kitchen KNEE ARTHROSCOPY WITH MEDIAL MENISECTOMY Right 11/08/2012   Procedure: RIGHT KNEE ARTHROSCOPY WITH PARTIAL MEDIAL AND LATERAL MENISECTOMIES, CHONDROPLASTY;  Surgeon: [name], MD;  Location: [facility];  Service: Orthopedics;  Laterality: Right;  RIGHT KNEE SCOPE WITH  PARTIAL MEDIAL AND LATERAL  MENISCECTOMIES  AND CHONDROPLASTY  . KNEE CLOSED REDUCTION Left 08/18/2016   Procedure: LEFT  MANIPULATION KNEE;  Surgeon: [name], MD;  Location: [facility];  Service: Orthopedics;  Laterality: Left;  . LIPOMA EXCISION Left    upper back  . MANIPULATION KNEE JOINT Left 09/14/2016   "he told my wife he did"  . MOHS SURGERY Left 10/2015   "forehead"  . TOTAL KNEE ARTHROPLASTY Left 05/18/2016   Procedure: LEFT TOTAL KNEE ARTHROPLASTY;  Surgeon: [name], MD;  Location: [facility];  Service: Orthopedics;  Laterality: Left;  . TOTAL KNEE ARTHROPLASTY Right 09/14/2016  . TOTAL KNEE  ARTHROPLASTY Right 09/14/2016   Procedure: TOTAL KNEE ARTHROPLASTY MANIPULATION LEFT KNEE;  Surgeon: [name], MD;  Location: [facility];  Service: Orthopedics;  Laterality: Right;    Prior to Admission medications   Medication Sig Start Date End Date Taking? Authorizing Provider  fenofibrate 160 MG tablet Take 1 tablet (160 mg total) by mouth daily. 02/05/18  Yes Laurey Morale, MD  lisinopril-hydrochlorothiazide (PRINZIDE,ZESTORETIC) 10-12.5 MG tablet Take 1 tablet by mouth daily. 02/01/18  Yes Laurey Morale, MD  metFORMIN (GLUCOPHAGE) 500 MG tablet TAKE 1 TABLET (500 MG TOTAL) BY MOUTH 2 (TWO) TIMES DAILY. Patient taking differently: Take 500 mg by mouth 2 (two) times daily with a meal.  02/01/18  Yes Laurey Morale, MD    Current Facility-Administered Medications  Medication Dose Route Frequency Provider Last Rate Last Dose  . 0.9 %  sodium chloride infusion   Intravenous Continuous Elwyn Reach, MD 100 mL/hr at 05/14/18 5465    . fenofibrate tablet 160 mg  160 mg Oral Daily Gala Romney L, MD   160 mg at 05/14/18 1015  . hydrochlorothiazide (MICROZIDE) capsule 12.5 mg  12.5 mg Oral Daily Gala Romney L, MD   12.5 mg at 05/14/18 1015  . insulin aspart (novoLOG) injection 0-5 Units  0-5 Units Subcutaneous QHS Garba, Mohammad L, MD      . insulin aspart (novoLOG) injection 0-9 Units  0-9 Units Subcutaneous TID WC Garba, Mohammad L, MD      . lisinopril (PRINIVIL,ZESTRIL) tablet 10 mg  10 mg Oral Daily Gala Romney L, MD   10 mg at 05/14/18 1015  . metFORMIN (GLUCOPHAGE) tablet 500 mg  500 mg Oral BID WC Garba, Mohammad L, MD      . ondansetron (ZOFRAN) tablet 4 mg  4 mg Oral Q6H PRN Elwyn Reach, MD       Or  . ondansetron (ZOFRAN) injection 4 mg  4 mg Intravenous Q6H PRN Gala Romney L, MD      . pantoprazole (PROTONIX) 80 mg in sodium chloride 0.9 % 250 mL (0.32 mg/mL) infusion  8 mg/hr Intravenous Continuous Gala Romney L, MD 25 mL/hr at 05/14/18 1014 8 mg/hr at  05/14/18 1014  . [START ON 05/17/2018] pantoprazole (PROTONIX) injection 40 mg  40 mg Intravenous Q12H Elwyn Reach, MD        Allergies as of 05/13/2018 - Review Complete 05/13/2018  Allergen Reaction Noted  . No known allergies  09/13/2016    Family History  Problem Relation Age of Onset  . Anesthesia problems Father        post-op N/V  . Colon cancer Neg Hx   . Esophageal cancer Neg Hx   . Rectal cancer Neg Hx   . Stomach cancer Neg Hx     Social History  Socioeconomic History  . Marital status: Married    Spouse name: Not on file  . Number of children: Not on file  . Years of education: Not on file  . Highest education level: Not on file  Occupational History  . Occupation: Visual merchandiser  . Financial resource strain: Not on file  . Food insecurity:    Worry: Not on file    Inability: Not on file  . Transportation needs:    Medical: Not on file    Non-medical: Not on file  Tobacco Use  . Smoking status: Never Smoker  . Smokeless tobacco: Never Used  Substance and Sexual Activity  . Alcohol use: Yes    Alcohol/week: 3.0 - 4.0 standard drinks    Types: 1 - 2 Standard drinks or equivalent, 1 Cans of beer, 1 Glasses of wine per week  . Drug use: No  . Sexual activity: Yes  Lifestyle  . Physical activity:    Days per week: Not on file    Minutes per session: Not on file  . Stress: Not on file  Relationships  . Social connections:    Talks on phone: Not on file    Gets together: Not on file    Attends religious service: Not on file    Active member of club or organization: Not on file    Attends meetings of clubs or organizations: Not on file    Relationship status: Not on file  . Intimate partner violence:    Fear of current or ex partner: Not on file    Emotionally abused: Not on file    Physically abused: Not on file    Forced sexual activity: Not on file  Other Topics Concern  . Not on file  Social History Narrative  . Not on file     Review of Systems: All systems reviewed and negative except where noted in HPI.  Physical Exam: Vital signs in last 24 hours: Temp:  [98 F (36.7 C)-98.7 F (37.1 C)] 98 F (36.7 C) (09/30 0405) Pulse Rate:  [76-103] 88 (09/30 1014) Resp:  [16-20] 16 (09/30 0405) BP: (107-155)/(62-90) 139/71 (09/30 1014) SpO2:  [96 %-98 %] 98 % (09/30 0405) Weight:  [138.8 kg-139.3 kg] 138.8 kg (09/29 2203) Last BM Date: 05/13/18 General:   Alert, white male NAD Psych:  Pleasant, cooperative. Normal mood and affect. Eyes:  Pupils equal, sclera clear, no icterus.   Conjunctiva pink. Ears:  Normal auditory acuity. Nose:  No deformity, discharge,  or lesions. Neck:  Supple; no masses Lungs:  Clear throughout to auscultation.   No wheezes, crackles, or rhonchi.  Heart:  Regular rate and rhythm; no murmurs, no edema Abdomen:  Soft, obese , nontender, BS active, no palp mass    Rectal: Small perianal skin tag .  No stool or blood in vault.  Msk:  Symmetrical without gross deformities. . Neurologic:  Alert and  oriented x4;  grossly normal neurologically. Skin:  Intact without significant lesions or rashes..   Intake/Output from previous day: 09/29 0701 - 09/30 0700 In: 864.2 [I.V.:764.2; IV Piggyback:100] Out: -  Intake/Output this shift: Total I/O In: -  Out: 250 [Urine:250]  Lab Results: Recent Labs    05/13/18 1804 05/14/18 0520  WBC 6.1 6.3  HGB 13.9 13.3  HCT 41.4 40.9  PLT 279 268   BMET Recent Labs    05/13/18 1804 05/14/18 0520  NA 140 141  K 3.8 3.8  CL 103 107  CO2  25 26  GLUCOSE 192* 144*  BUN 17 13  CREATININE 1.03 0.94  CALCIUM 9.5 8.7*   LFT Recent Labs    05/14/18 0520  PROT 7.1  ALBUMIN 3.8  AST 26  ALT 30  ALKPHOS 38  BILITOT 0.5     Studies/Results: Ct Abdomen Pelvis W Contrast  Result Date: 05/13/2018 CLINICAL DATA:  Rectal bleeding. EXAM: CT ABDOMEN AND PELVIS WITH CONTRAST TECHNIQUE: Multidetector CT imaging of the abdomen and pelvis  was performed using the standard protocol following bolus administration of intravenous contrast. CONTRAST:  155mL ISOVUE-300 IOPAMIDOL (ISOVUE-300) INJECTION 61% COMPARISON:  CT scan of September 18, 2014. FINDINGS: Lower chest: No acute abnormality. Hepatobiliary: No gallstones are noted. Fatty infiltration of the liver is noted. No biliary dilatation is noted. Pancreas: Unremarkable. No pancreatic ductal dilatation or surrounding inflammatory changes. Spleen: Normal in size without focal abnormality. Adrenals/Urinary Tract: Adrenal glands appear normal. Stable right renal cyst. No hydronephrosis or renal obstruction is noted. No renal or ureteral calculi are noted. Urinary bladder is unremarkable. Stomach/Bowel: Stomach is within normal limits. Appendix appears normal. No evidence of bowel wall thickening, distention, or inflammatory changes. Sigmoid diverticulosis is noted without inflammation. Vascular/Lymphatic: No significant vascular findings are present. No enlarged abdominal or pelvic lymph nodes. Reproductive: Prostate is unremarkable. Other: No abdominal wall hernia or abnormality. No abdominopelvic ascites. Musculoskeletal: Multilevel degenerative disc disease is noted in the lumbar spine. No acute abnormality is noted. IMPRESSION: Fatty infiltration of the liver. Sigmoid diverticulosis without inflammation. No acute abnormality seen in the abdomen or pelvis. Electronically Signed   By: Marijo Conception, M.D.   On: 05/13/2018 20:33     Tye Savoy, NP-C @  05/14/2018, 10:42 AM    Attending physician's note   I have taken a history, examined the patient and reviewed the chart. I agree with the Advanced Practitioner's note, impression and recommendations. Low volume bright red blood per rectum likely secondary to hemorrhoidal hemorrhage.  Hemoglobin has remained stable with no hemodynamic instability. Start Anusol suppository per rectum twice daily for 5 days Benefiber 1 packet 3 times daily  with meals Increase fluid intake His son is getting married in [place] and he was planning to take the flight to [place] later this evening.  Patient is aware of possible risk of recurrent bleeding. Okay to discharge this afternoon if he remains hemodynamically stable with no further bleeding.  Damaris Hippo , MD [phone]

## 2018-05-14 NOTE — Progress Notes (Signed)
Took over care of patient. Patient stable with no complaints.  Gonzella Lex, RN 05/14/2018 1140am

## 2018-05-15 ENCOUNTER — Telehealth: Payer: Self-pay | Admitting: Family Medicine

## 2018-05-15 NOTE — Telephone Encounter (Signed)
Unable to reach patient at time of TCM Call. Left message for patient to return call when available.  

## 2018-05-17 NOTE — Telephone Encounter (Signed)
Unable to reach patient at time of TCM Call. Left message for patient to return call when available.  

## 2018-05-30 ENCOUNTER — Telehealth: Payer: Self-pay | Admitting: Primary Care

## 2018-05-30 NOTE — Telephone Encounter (Signed)
I called and spoke with Jeanett Schlein with [facility] and she stated that the order was only for Cpap supplies. When I looked at the note from 05/02/2018 it plainly states order for new Cpap Machine his machine was 64 years old. The order that was placed didn't state New machine on Cpap supplies and that is why the patient has not received a new machine yet. Jeanett Schlein was going to call the patient this afternoon or in the morning about the order

## 2018-05-31 ENCOUNTER — Other Ambulatory Visit: Payer: Self-pay

## 2018-05-31 DIAGNOSIS — G4733 Obstructive sleep apnea (adult) (pediatric): Secondary | ICD-10-CM

## 2018-05-31 NOTE — Telephone Encounter (Signed)
Sent order to [facility] for new cpap & cpap supplies.

## 2018-06-04 NOTE — Telephone Encounter (Signed)
Left message for patient to call back to update him on his cpap order.

## 2018-06-05 NOTE — Telephone Encounter (Signed)
Spoke with pt. He is aware that this order has been taken care of. Nothing further was needed.

## 2018-06-13 DIAGNOSIS — G4733 Obstructive sleep apnea (adult) (pediatric): Secondary | ICD-10-CM | POA: Diagnosis not present

## 2018-07-10 MED FILL — metFORMIN HCL 500 MG TABS: 500 | 90 days supply | Qty: 180 | Fill #1

## 2018-07-14 DIAGNOSIS — G4733 Obstructive sleep apnea (adult) (pediatric): Secondary | ICD-10-CM | POA: Diagnosis not present

## 2018-07-25 DIAGNOSIS — H52221 Regular astigmatism, right eye: Secondary | ICD-10-CM | POA: Diagnosis not present

## 2018-07-25 DIAGNOSIS — Z970 Presence of artificial eye: Secondary | ICD-10-CM | POA: Diagnosis not present

## 2018-07-25 DIAGNOSIS — H33311 Horseshoe tear of retina without detachment, right eye: Secondary | ICD-10-CM | POA: Diagnosis not present

## 2018-07-25 DIAGNOSIS — H524 Presbyopia: Secondary | ICD-10-CM | POA: Diagnosis not present

## 2018-07-25 DIAGNOSIS — H5442A5 Blindness left eye category 5, normal vision right eye: Secondary | ICD-10-CM | POA: Diagnosis not present

## 2018-08-02 MED FILL — FENOFIBRATE 160 MG TABLET: 160 | 90 days supply | Qty: 90 | Fill #2

## 2018-08-13 DIAGNOSIS — G4733 Obstructive sleep apnea (adult) (pediatric): Secondary | ICD-10-CM | POA: Diagnosis not present

## 2018-08-23 MED FILL — LISINOPRIL-HCTZ 10-12.5 MG: 10-12.5 | 90 days supply | Qty: 90 | Fill #2

## 2018-08-29 ENCOUNTER — Telehealth: Payer: Self-pay | Admitting: Primary Care

## 2018-08-29 NOTE — Telephone Encounter (Signed)
Called and spoke with Jermaine James, advised her we have not received anything just yet. Will route this over to [place] to follow up on.

## 2018-08-30 NOTE — Telephone Encounter (Signed)
I now have this form and it is only for tubing that was left off the original order from 05/31/2018. I will be giving it to [facility] to sign

## 2018-08-30 NOTE — Telephone Encounter (Signed)
Called and spoke with Juliann Pulse with [employer] this morning regarding paperwork for CPAP supplies She stated that she will have Jeanette refax paperwork this morning Provided her with our new office fax number She verbalized understanding. Awaiting fax.

## 2018-09-03 NOTE — Telephone Encounter (Signed)
Jermaine James please advise on this, thank you.

## 2018-09-03 NOTE — Telephone Encounter (Signed)
Beth signed this form on 08/31/18 and it was faxed to [facility]

## 2018-09-05 ENCOUNTER — Encounter: Payer: Self-pay | Admitting: Family Medicine

## 2018-09-05 DIAGNOSIS — H43811 Vitreous degeneration, right eye: Secondary | ICD-10-CM | POA: Diagnosis not present

## 2018-09-05 DIAGNOSIS — H43391 Other vitreous opacities, right eye: Secondary | ICD-10-CM | POA: Diagnosis not present

## 2018-09-05 DIAGNOSIS — H4311 Vitreous hemorrhage, right eye: Secondary | ICD-10-CM | POA: Diagnosis not present

## 2018-09-05 DIAGNOSIS — H2511 Age-related nuclear cataract, right eye: Secondary | ICD-10-CM | POA: Diagnosis not present

## 2018-09-05 LAB — HM DIABETES EYE EXAM

## 2018-09-13 DIAGNOSIS — G4733 Obstructive sleep apnea (adult) (pediatric): Secondary | ICD-10-CM | POA: Diagnosis not present

## 2018-10-13 DIAGNOSIS — G4733 Obstructive sleep apnea (adult) (pediatric): Secondary | ICD-10-CM | POA: Diagnosis not present

## 2018-10-26 MED FILL — FENOFIBRATE 160 MG TABLET: 160 | 90 days supply | Qty: 90 | Fill #3

## 2018-10-26 MED FILL — metFORMIN HCL 500 MG TABS: 500 | 90 days supply | Qty: 180 | Fill #2

## 2018-11-09 MED FILL — LISINOPRIL-HCTZ 10-12.5 MG: 10-12.5 | 90 days supply | Qty: 90 | Fill #0

## 2018-11-12 DIAGNOSIS — G4733 Obstructive sleep apnea (adult) (pediatric): Secondary | ICD-10-CM | POA: Diagnosis not present

## 2018-12-04 ENCOUNTER — Ambulatory Visit (INDEPENDENT_AMBULATORY_CARE_PROVIDER_SITE_OTHER): Payer: 59 | Admitting: Family Medicine

## 2018-12-04 ENCOUNTER — Encounter: Payer: Self-pay | Admitting: Family Medicine

## 2018-12-04 DIAGNOSIS — M545 Low back pain, unspecified: Secondary | ICD-10-CM

## 2018-12-04 MED ORDER — METHYLPREDNISOLONE 4 MG PO TBPK: ORAL_TABLET | ORAL | 0 refills | Status: DC

## 2018-12-04 MED FILL — METHYLPREDNISOLONE 4 MG TBP: 4 | 6 days supply | Qty: 21 | Fill #0

## 2018-12-04 NOTE — Progress Notes (Signed)
Subjective:    Patient ID: Jermaine James, male    DOB: June 20, 1956, 63 y.o.   MRN: [id number]  HPI Virtual Visit via Video Note  I connected with the patient on 12/04/18 at  2:00 PM EDT by a video enabled telemedicine application and verified that I am speaking with the correct person using two identifiers.  Location patient: home Location provider:work or home office Persons participating in the virtual visit: patient, provider  I discussed the limitations of evaluation and management by telemedicine and the availability of in person appointments. The patient expressed understanding and agreed to proceed.   HPI: Here for 4 days of sharp low back pains that started after he was doing some toe touches for exercise. The pain goes across both sides of the lower back. No pain or numbness or weakness in the legs. Moist heat helps a little. Flexeril and naproxen have no helped much. No urinary symptoms.    ROS: See pertinent positives and negatives per HPI.  Past Medical History:  Diagnosis Date  . 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 Chronic kidney disease   . 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 Dental crowns present   . Family history of adverse reaction to anesthesia    pt's father has hx. of post-op N/V  . High triglycerides    no current med.  Marland Kitchen History of gout   . History of kidney stones   . Hypertension    states under control with med., has been on med. ~ 31 yr.  . Non-insulin dependent type 2 diabetes mellitus ([place])   . Obesity, Class III, BMI 40-49.9 (morbid obesity) ([place])   . OSA on CPAP   . Osteoarthritis    knees (09/14/2016)  . Sleep apnea   . 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 COLONOSCOPY  08/29/2005   per Dr. Sharlett Iles, diverticulosis only, repeat in 10 yrs   . CYSTOSCOPY  2011   "related to trace blood in urine"  . EYE SURGERY Left 1961   traumatic loss of  eye  . INGUINAL HERNIA REPAIR Left   . JOINT REPLACEMENT    . KNEE ARTHROSCOPY Left 1990s?  Marland Kitchen KNEE ARTHROSCOPY WITH MEDIAL MENISECTOMY Right 11/08/2012   Procedure: RIGHT KNEE ARTHROSCOPY WITH PARTIAL MEDIAL AND LATERAL MENISECTOMIES, CHONDROPLASTY;  Surgeon: [name], MD;  Location: [facility];  Service: Orthopedics;  Laterality: Right;  RIGHT KNEE SCOPE WITH PARTIAL MEDIAL AND LATERAL  MENISCECTOMIES  AND CHONDROPLASTY  . KNEE CLOSED REDUCTION Left 08/18/2016   Procedure: LEFT  MANIPULATION KNEE;  Surgeon: [name], MD;  Location: [facility];  Service: Orthopedics;  Laterality: Left;  . LIPOMA EXCISION Left    upper back  . MANIPULATION KNEE JOINT Left 09/14/2016   "he told my wife he did"  . MOHS SURGERY Left 10/2015   "forehead"  . TOTAL KNEE ARTHROPLASTY Left 05/18/2016   Procedure: LEFT TOTAL KNEE ARTHROPLASTY;  Surgeon: [name], MD;  Location: [facility];  Service: Orthopedics;  Laterality: Left;  . TOTAL KNEE ARTHROPLASTY Right 09/14/2016  . TOTAL KNEE ARTHROPLASTY Right 09/14/2016   Procedure: TOTAL KNEE ARTHROPLASTY MANIPULATION LEFT KNEE;  Surgeon: [name], MD;  Location: [facility];  Service: Orthopedics;  Laterality: Right;    Family History  Problem Relation Age of Onset  . Anesthesia  problems Father        post-op N/V  . Colon cancer Neg Hx   . Esophageal cancer Neg Hx   . Rectal cancer Neg Hx   . Stomach cancer Neg Hx      Current Outpatient Medications:  .  fenofibrate 160 MG tablet, Take 1 tablet (160 mg total) by mouth daily., Disp: 90 tablet, Rfl: 3 .  lisinopril-hydrochlorothiazide (PRINZIDE,ZESTORETIC) 10-12.5 MG tablet, Take 1 tablet by mouth daily., Disp: 90 tablet, Rfl: 3 .  metFORMIN (GLUCOPHAGE) 500 MG tablet, TAKE 1 TABLET (500 MG TOTAL) BY MOUTH 2 (TWO) TIMES DAILY. (Patient taking differently: Take 500 mg by mouth 2 (two) times daily with a meal. ), Disp: 180 tablet, Rfl: 3 .  hydrocortisone (ANUSOL-HC) 25  MG suppository, Place 1 suppository (25 mg total) rectally 2 (two) times daily. (Patient not taking: Reported on 12/04/2018), Disp: 12 suppository, Rfl: 0 .  methylPREDNISolone (MEDROL DOSEPAK) 4 MG TBPK tablet, As directed, Disp: 21 tablet, Rfl: 0  EXAM:  VITALS per patient if applicable:  GENERAL: alert, oriented, appears well and in no acute distress  HEENT: atraumatic, conjunttiva clear, no obvious abnormalities on inspection of external nose and ears  NECK: normal movements of the head and neck  LUNGS: on inspection no signs of respiratory distress, breathing rate appears normal, no obvious gross SOB, gasping or wheezing  CV: no obvious cyanosis  MS: moves all visible extremities without noticeable abnormality  PSYCH/NEURO: pleasant and cooperative, no obvious depression or anxiety, speech and thought processing grossly intact  ASSESSMENT AND PLAN: Low back pain. Try a Medrol dose pack. Add Tylenol prn. Recheck prn.  Alysia Penna, MD  Discussed the following assessment and plan:  No diagnosis found.     I discussed the assessment and treatment plan with the patient. The patient was provided an opportunity to ask questions and all were answered. The patient agreed with the plan and demonstrated an understanding of the instructions.   The patient was advised to call back or seek an in-person evaluation if the symptoms worsen or if the condition fails to improve as anticipated.     Review of Systems     Objective:   Physical Exam        Assessment & Plan:

## 2018-12-10 ENCOUNTER — Encounter: Payer: Self-pay | Admitting: *Deleted

## 2018-12-10 ENCOUNTER — Encounter: Payer: Self-pay | Admitting: Family Medicine

## 2018-12-10 ENCOUNTER — Other Ambulatory Visit: Payer: Self-pay

## 2018-12-10 NOTE — Telephone Encounter (Signed)
Are we able to do this?

## 2018-12-11 ENCOUNTER — Telehealth: Payer: Self-pay | Admitting: Family Medicine

## 2018-12-11 NOTE — Telephone Encounter (Signed)
Copied from [place] [id number]. Topic: Quick Communication - See Telephone Encounter >> Dec 11, 2018 11:08 AM Rayann Heman wrote: CRM for notification. See Telephone encounter for: 12/11/18.pt called and stated that he was seen on 12/04/18 for back pain and the symptoms are worse. Pt would like a call back regarding. Please advise

## 2018-12-11 NOTE — Telephone Encounter (Signed)
Bring him in for a live OV

## 2018-12-11 NOTE — Telephone Encounter (Signed)
Pt is scheduled for ov in the am

## 2018-12-11 NOTE — Telephone Encounter (Signed)
Dr. Sarajane Jews please advise if you want to bring the pt into the office for visit or another virtual visit?  Thanks

## 2018-12-12 ENCOUNTER — Encounter: Payer: Self-pay | Admitting: Family Medicine

## 2018-12-12 ENCOUNTER — Ambulatory Visit (INDEPENDENT_AMBULATORY_CARE_PROVIDER_SITE_OTHER): Payer: 59 | Admitting: Family Medicine

## 2018-12-12 ENCOUNTER — Other Ambulatory Visit: Payer: Self-pay

## 2018-12-12 ENCOUNTER — Telehealth: Payer: Self-pay | Admitting: *Deleted

## 2018-12-12 VITALS — BP 130/82 | HR 89 | Temp 98.0°F | Wt 308.0 lb

## 2018-12-12 DIAGNOSIS — M545 Low back pain: Secondary | ICD-10-CM | POA: Diagnosis not present

## 2018-12-12 DIAGNOSIS — G4733 Obstructive sleep apnea (adult) (pediatric): Secondary | ICD-10-CM | POA: Diagnosis not present

## 2018-12-12 DIAGNOSIS — M79604 Pain in right leg: Secondary | ICD-10-CM

## 2018-12-12 MED ORDER — DICLOFENAC SODIUM 75 MG PO TBEC: 75.0000 mg | DELAYED_RELEASE_TABLET | Freq: Two times a day (BID) | ORAL | 2 refills | Status: DC

## 2018-12-12 MED ORDER — CYCLOBENZAPRINE HCL 10 MG PO TABS: 10.0000 mg | ORAL_TABLET | Freq: Three times a day (TID) | ORAL | 2 refills | Status: DC | PRN

## 2018-12-12 MED ORDER — OXYCODONE-ACETAMINOPHEN 10-325 MG PO TABS: 1.0000 | ORAL_TABLET | ORAL | 0 refills | Status: AC | PRN

## 2018-12-12 MED FILL — CYCLOBENZAPRINE HCL 10 MG T: 10 | 20 days supply | Qty: 60 | Fill #0

## 2018-12-12 MED FILL — DICLOFENAC SODIUM 75 MG TAB: 75 | 30 days supply | Qty: 60 | Fill #0

## 2018-12-12 MED FILL — OXYCODONE-APAP 10-325: 10-325 | 5 days supply | Qty: 30 | Fill #0

## 2018-12-12 NOTE — Progress Notes (Signed)
   Subjective:    Patient ID: Jermaine James, male    DOB: 1955-10-13, 63 y.o.   MRN: [id number]  HPI Here for severe low back pain. This started about 12 days ago after he was exercising at home. Part of that routine was standing and touching his toes. The pain involves both sides of the lower back and it radiates into both upper legs. He also has numbness and tingling down the right leg to the ankle. No weakness. He has taken a Medrol dose pack and got mild relief for a short time. Currently he is using Tylenol and heat.    Review of Systems  Constitutional: Negative.   Respiratory: Negative.   Cardiovascular: Negative.   Musculoskeletal: Positive for back pain.       Objective:   Physical Exam Constitutional:      Appearance: He is obese.     Comments: In obvious pain   Cardiovascular:     Rate and Rhythm: Normal rate and regular rhythm.     Pulses: Normal pulses.     Heart sounds: Normal heart sounds.  Pulmonary:     Effort: Pulmonary effort is normal.     Breath sounds: Normal breath sounds.  Musculoskeletal:     Comments: He is very tender over the lower spine at the L4 and L5 levels. He is tender over the right sciatic notch. ROM is limited by pain. SLR are negative   Neurological:     Mental Status: He is alert.           Assessment & Plan:  Low back pain, likely due to disc disease. He will try Diclofenac, Flexeril, and Percocet for pain relief. Use moist heat. Set up an MRI to evaluate further.  Alysia Penna, MD

## 2018-12-12 NOTE — Telephone Encounter (Signed)
Copied from [place] [id number]. Topic: General - Other >> Dec 12, 2018  3:42 PM Valla Leaver wrote: Reason for CRM: Patient scheduled 05/12 for MRI of lower back and wants to know if Dr. Sarajane Jews will make it STAT so it can be sooner?

## 2018-12-13 ENCOUNTER — Ambulatory Visit (HOSPITAL_COMMUNITY)
Admission: RE | Admit: 2018-12-13 | Discharge: 2018-12-13 | Disposition: A | Discharge status: Home / Self Care | Payer: 59 | Source: Ambulatory Visit | Attending: Family Medicine | Admitting: Family Medicine

## 2018-12-13 ENCOUNTER — Telehealth: Payer: Self-pay | Admitting: *Deleted

## 2018-12-13 DIAGNOSIS — M79604 Pain in right leg: Secondary | ICD-10-CM

## 2018-12-13 DIAGNOSIS — M48061 Spinal stenosis, lumbar region without neurogenic claudication: Secondary | ICD-10-CM | POA: Diagnosis not present

## 2018-12-13 DIAGNOSIS — G4733 Obstructive sleep apnea (adult) (pediatric): Secondary | ICD-10-CM | POA: Diagnosis not present

## 2018-12-13 DIAGNOSIS — M5126 Other intervertebral disc displacement, lumbar region: Secondary | ICD-10-CM | POA: Diagnosis not present

## 2018-12-13 DIAGNOSIS — M545 Low back pain: Secondary | ICD-10-CM | POA: Diagnosis not present

## 2018-12-13 MED ORDER — LORAZEPAM 0.5 MG PO TABS: ORAL_TABLET | ORAL | 0 refills | Status: DC

## 2018-12-13 MED FILL — LORazepam 0.5 MG TABS: 0.5 | 2 days supply | Qty: 2 | Fill #0

## 2018-12-13 NOTE — Telephone Encounter (Signed)
Called and spoke with pts wife and she is aware of appt today for the MRI

## 2018-12-13 NOTE — Telephone Encounter (Signed)
Pt has been scheduled for MRI at [facility] at 2:30pm today, enter at [facility] building to left of pharmacy    [facility]

## 2018-12-13 NOTE — Telephone Encounter (Signed)
Copied from [place] [id number]. Topic: General - Inquiry >> Dec 13, 2018  2:32 PM Virl Axe D wrote: Reason for CRM: Megan with [facility] called and stated pt is going to need Ativan in order to do his MRI today. They would like to know if Dr. Sarajane Jews can send in rx to [place]. Please advise. [place], [place] - [place] [phone] (Phone) [phone] (Fax)  Rx taken care of by Dorothyann Peng, NP.

## 2018-12-13 NOTE — Telephone Encounter (Signed)
Dr. Sarajane Jews out of the office this afternoon. Patient asking for Ativan for MRI this afternoon.   Will send in ativan for MRI

## 2018-12-13 NOTE — Telephone Encounter (Signed)
Dr. Sarajane Jews please advise if we can schedule the MRI for a stat so he can have the MRI sooner than the 5/12 date that it was scheduled.  thanks

## 2018-12-13 NOTE — Addendum Note (Signed)
Addended by: Alysia Penna A on: 12/13/2018 09:21 AM   Modules accepted: Orders

## 2018-12-13 NOTE — Telephone Encounter (Signed)
I put in a new referral as "stat"

## 2018-12-19 ENCOUNTER — Telehealth: Payer: Self-pay | Admitting: *Deleted

## 2018-12-19 DIAGNOSIS — M5441 Lumbago with sciatica, right side: Secondary | ICD-10-CM

## 2018-12-19 NOTE — Telephone Encounter (Signed)
Dr. Fry please advise. Thanks  

## 2018-12-19 NOTE — Telephone Encounter (Signed)
Copied from [place] [id number]. Topic: Referral - Request for Referral >> Dec 19, 2018  2:09 PM Nils Flack wrote: Has patient seen PCP for this complaint? Yes.   *If NO, is insurance requiring patient see PCP for this issue before PCP can refer them? Referral for which specialty: mri results  Preferred provider/office: [place] [facility] Reason for referral: wife called, she would like referral for neurosurgery to be put in.   Please call pt back to review mri, what to activities encourage and discourage  Thanks

## 2018-12-21 DIAGNOSIS — M5106 Intervertebral disc disorders with myelopathy, lumbar region: Secondary | ICD-10-CM | POA: Diagnosis not present

## 2018-12-21 NOTE — Telephone Encounter (Signed)
I did an urgent referral to Neurosurgery

## 2018-12-25 ENCOUNTER — Other Ambulatory Visit: Payer: 59

## 2018-12-26 DIAGNOSIS — M5416 Radiculopathy, lumbar region: Secondary | ICD-10-CM | POA: Diagnosis not present

## 2018-12-26 DIAGNOSIS — M5126 Other intervertebral disc displacement, lumbar region: Secondary | ICD-10-CM | POA: Diagnosis not present

## 2019-01-08 DIAGNOSIS — M549 Dorsalgia, unspecified: Secondary | ICD-10-CM | POA: Insufficient documentation

## 2019-01-09 DIAGNOSIS — M5126 Other intervertebral disc displacement, lumbar region: Secondary | ICD-10-CM | POA: Diagnosis not present

## 2019-01-09 DIAGNOSIS — M48062 Spinal stenosis, lumbar region with neurogenic claudication: Secondary | ICD-10-CM | POA: Diagnosis not present

## 2019-01-09 DIAGNOSIS — M5136 Other intervertebral disc degeneration, lumbar region: Secondary | ICD-10-CM | POA: Insufficient documentation

## 2019-01-09 DIAGNOSIS — M546 Pain in thoracic spine: Secondary | ICD-10-CM | POA: Diagnosis not present

## 2019-01-09 DIAGNOSIS — I10 Essential (primary) hypertension: Secondary | ICD-10-CM | POA: Diagnosis not present

## 2019-01-09 DIAGNOSIS — Z6841 Body Mass Index (BMI) 40.0 and over, adult: Secondary | ICD-10-CM | POA: Diagnosis not present

## 2019-01-09 DIAGNOSIS — M47816 Spondylosis without myelopathy or radiculopathy, lumbar region: Secondary | ICD-10-CM | POA: Diagnosis not present

## 2019-01-09 DIAGNOSIS — M545 Low back pain: Secondary | ICD-10-CM | POA: Diagnosis not present

## 2019-01-09 DIAGNOSIS — M549 Dorsalgia, unspecified: Secondary | ICD-10-CM | POA: Diagnosis not present

## 2019-01-09 DIAGNOSIS — M4316 Spondylolisthesis, lumbar region: Secondary | ICD-10-CM | POA: Diagnosis not present

## 2019-01-12 DIAGNOSIS — G4733 Obstructive sleep apnea (adult) (pediatric): Secondary | ICD-10-CM | POA: Diagnosis not present

## 2019-01-21 MED FILL — CYCLOBENZAPRINE HCL 10 MG T: 10 | 20 days supply | Qty: 60 | Fill #1

## 2019-01-22 DIAGNOSIS — M5126 Other intervertebral disc displacement, lumbar region: Secondary | ICD-10-CM | POA: Diagnosis not present

## 2019-01-22 DIAGNOSIS — M48062 Spinal stenosis, lumbar region with neurogenic claudication: Secondary | ICD-10-CM | POA: Diagnosis not present

## 2019-01-22 DIAGNOSIS — M4316 Spondylolisthesis, lumbar region: Secondary | ICD-10-CM | POA: Diagnosis not present

## 2019-01-22 DIAGNOSIS — M47816 Spondylosis without myelopathy or radiculopathy, lumbar region: Secondary | ICD-10-CM | POA: Diagnosis not present

## 2019-01-22 DIAGNOSIS — M5136 Other intervertebral disc degeneration, lumbar region: Secondary | ICD-10-CM | POA: Diagnosis not present

## 2019-01-23 ENCOUNTER — Other Ambulatory Visit: Payer: Self-pay | Admitting: Neurosurgery

## 2019-01-29 NOTE — Pre-Procedure Instructions (Signed)
Jermaine James  01/29/2019      [place], [place] [address] Phone: [phone] Fax: [phone]    Your procedure is scheduled on Thurs., February 07, 2019 from 7:30AM-1:35PM  Report to [facility] Entrance "A" at 5:30AM  Call this number if you have problems the morning of surgery:  [phone]   Remember:  Do not eat or drink after midnight on June 24th    Take these medicines the morning of surgery with A SIP OF WATER: Fenofibrate  If needed: Cyclobenzaprine (FLEXERIL)   7 days before surgery (01/31/19), stop taking all Other Aspirin Products, Vitamins, Fish oils, and Herbal medications. Also stop all NSAIDS i.e. Advil, Ibuprofen, Motrin, Aleve, Anaprox, Naproxen, BC, Goody Powders, and all Supplements. Including: Diclofenac (VOLTAREN)  Do not take MetFORMIN (GLUCOPHAGE) the morning of surgery.   How to Manage Your Diabetes Before and After Surgery  Why is it important to control my blood sugar before and after surgery? . Improving blood sugar levels before and after surgery helps healing and can limit problems. . A way of improving blood sugar control is eating a healthy diet by: o  Eating less sugar and carbohydrates o  Increasing activity/exercise o  Talking with your doctor about reaching your blood sugar goals . High blood sugars (greater than 180 mg/dL) can raise your risk of infections and slow your recovery, so you will need to focus on controlling your diabetes during the weeks before surgery. . Make sure that the doctor who takes care of your diabetes knows about your planned surgery including the date and location.  How do I manage my blood sugar before surgery? . Check your blood sugar at least 4 times a day, starting 2 days before surgery, to make sure that the level is not too high or low. o Check your blood sugar the morning of your surgery when you wake up and  every 2 hours until you get to the Short Stay unit. . If your blood sugar is less than 70 mg/dL, you will need to treat for low blood sugar: o Do not take insulin. o Treat a low blood sugar (less than 70 mg/dL) with  cup of clear juice (cranberry or apple), 4 glucose tablets, OR glucose gel. Recheck blood sugar in 15 minutes after treatment (to make sure it is greater than 70 mg/dL). If your blood sugar is not greater than 70 mg/dL on recheck, call [phone] o  for further instructions. . If your CBG is greater than 220 mg/dL, inform the staff upon arrival to Short Stay.  . If you are admitted to the hospital after surgery: o Your blood sugar will be checked by the staff and you will probably be given insulin after surgery (instead of oral diabetes medicines) to make sure you have good blood sugar levels. o The goal for blood sugar control after surgery is 80-180 mg/dL.  Reviewed and Endorsed by [facility] Patient Education Committee, August 2015  You may bring your CPAP machine, and equipment with you the day of surgery.   Special instructions:   [place]- Preparing For Surgery  Before surgery, you can play an important role. Because skin is not sterile, your skin needs to be as free of germs as possible. You can reduce the number of germs on your skin by washing with CHG (chlorahexidine gluconate) Soap before surgery.  CHG is an antiseptic cleaner  which kills germs and bonds with the skin to continue killing germs even after washing.    Please do not use if you have an allergy to CHG or antibacterial soaps. If your skin becomes reddened/irritated stop using the CHG.  Do not shave (including legs and underarms) for at least 48 hours prior to first CHG shower. It is OK to shave your face.  Please follow these instructions carefully.   1. Shower the NIGHT BEFORE SURGERY and the MORNING OF SURGERY with CHG.   2. If you chose to wash your hair, wash your hair first as usual with your  normal shampoo.  3. After you shampoo, rinse your hair and body thoroughly to remove the shampoo.  4. Use CHG as you would any other liquid soap. You can apply CHG directly to the skin and wash gently with a scrungie or a clean washcloth.   5. Apply the CHG Soap to your body ONLY FROM THE NECK DOWN.  Do not use on open wounds or open sores. Avoid contact with your eyes, ears, mouth and genitals (private parts). Wash Face and genitals (private parts)  with your normal soap.  6. Wash thoroughly, paying special attention to the area where your surgery will be performed.  7. Thoroughly rinse your body with warm water from the neck down.  8. DO NOT shower/wash with your normal soap after using and rinsing off the CHG Soap.  9. Pat yourself dry with a CLEAN TOWEL.  10. Wear CLEAN PAJAMAS to bed the night before surgery, wear comfortable clothes the morning of surgery  11. Place CLEAN SHEETS on your bed the night of your first shower and DO NOT SLEEP WITH PETS.  Day of Surgery:  Oral Hygiene is also important to reduce your risk of infection.  Remember - BRUSH YOUR TEETH THE MORNING OF SURGERY WITH YOUR REGULAR TOOTHPASTE    Do not wear jewelry.  Do not wear lotions, powders, colognes, or deodorant.  Do not shave 48 hours prior to surgery.  Men may shave face.  Do not bring valuables to the hospital.  [facility] is not responsible for any belongings or valuables.  Contacts, dentures or bridgework may not be worn into surgery.    For patients admitted to the hospital, discharge time will be determined by your treatment team.  Patients discharged the day of surgery will not be allowed to drive home.   Please wear clean clothes to the hospital/surgery center.     Please read over the following fact sheets that you were given. Pain Booklet, Coughing and Deep Breathing, MRSA Information and Surgical Site Infection Prevention

## 2019-01-30 ENCOUNTER — Other Ambulatory Visit: Payer: Self-pay

## 2019-01-30 ENCOUNTER — Encounter (HOSPITAL_COMMUNITY)
Admission: RE | Admit: 2019-01-30 | Discharge: 2019-01-30 | Disposition: A | Discharge status: Home / Self Care | Payer: 59 | Source: Ambulatory Visit | Attending: Neurosurgery | Admitting: Neurosurgery

## 2019-01-30 ENCOUNTER — Encounter (HOSPITAL_COMMUNITY): Payer: Self-pay

## 2019-01-30 DIAGNOSIS — Z79899 Other long term (current) drug therapy: Secondary | ICD-10-CM | POA: Insufficient documentation

## 2019-01-30 DIAGNOSIS — N189 Chronic kidney disease, unspecified: Secondary | ICD-10-CM | POA: Insufficient documentation

## 2019-01-30 DIAGNOSIS — G4733 Obstructive sleep apnea (adult) (pediatric): Secondary | ICD-10-CM | POA: Insufficient documentation

## 2019-01-30 DIAGNOSIS — E669 Obesity, unspecified: Secondary | ICD-10-CM | POA: Insufficient documentation

## 2019-01-30 DIAGNOSIS — I129 Hypertensive chronic kidney disease with stage 1 through stage 4 chronic kidney disease, or unspecified chronic kidney disease: Secondary | ICD-10-CM | POA: Diagnosis not present

## 2019-01-30 DIAGNOSIS — Z01818 Encounter for other preprocedural examination: Secondary | ICD-10-CM | POA: Insufficient documentation

## 2019-01-30 DIAGNOSIS — M48062 Spinal stenosis, lumbar region with neurogenic claudication: Secondary | ICD-10-CM | POA: Diagnosis not present

## 2019-01-30 DIAGNOSIS — E781 Pure hyperglyceridemia: Secondary | ICD-10-CM | POA: Diagnosis not present

## 2019-01-30 DIAGNOSIS — E1122 Type 2 diabetes mellitus with diabetic chronic kidney disease: Secondary | ICD-10-CM | POA: Insufficient documentation

## 2019-01-30 DIAGNOSIS — Z6841 Body Mass Index (BMI) 40.0 and over, adult: Secondary | ICD-10-CM | POA: Insufficient documentation

## 2019-01-30 DIAGNOSIS — Z7984 Long term (current) use of oral hypoglycemic drugs: Secondary | ICD-10-CM | POA: Insufficient documentation

## 2019-01-30 DIAGNOSIS — H5440 Blindness, one eye, unspecified eye: Secondary | ICD-10-CM | POA: Diagnosis not present

## 2019-01-30 DIAGNOSIS — Z87442 Personal history of urinary calculi: Secondary | ICD-10-CM | POA: Diagnosis not present

## 2019-01-30 DIAGNOSIS — Z85828 Personal history of other malignant neoplasm of skin: Secondary | ICD-10-CM | POA: Diagnosis not present

## 2019-01-30 DIAGNOSIS — M1990 Unspecified osteoarthritis, unspecified site: Secondary | ICD-10-CM | POA: Diagnosis not present

## 2019-01-30 LAB — BASIC METABOLIC PANEL
Anion gap: 12 (ref 5–15)
BUN: 15 mg/dL (ref 8–23)
CO2: 23 mmol/L (ref 22–32)
Calcium: 9.5 mg/dL (ref 8.9–10.3)
Chloride: 101 mmol/L (ref 98–111)
Creatinine, Ser: 0.94 mg/dL (ref 0.61–1.24)
GFR calc Af Amer: >60 mL/min (ref >60)
GFR calc non Af Amer: >60 mL/min (ref >60)
Glucose, Bld: 146 mg/dL — ABNORMAL HIGH (ref 70–99)
Potassium: 4 mmol/L (ref 3.5–5.1)
Sodium: 136 mmol/L (ref 135–145)

## 2019-01-30 LAB — TYPE AND SCREEN
ABO/RH(D): A POS
Antibody Screen: NEGATIVE

## 2019-01-30 LAB — CBC
HCT: 43.1 % (ref 39.0–52.0)
Hemoglobin: 14.2 g/dL (ref 13.0–17.0)
MCH: 30.3 pg (ref 26.0–34.0)
MCHC: 32.9 g/dL (ref 30.0–36.0)
MCV: 92.1 fL (ref 80.0–100.0)
Platelets: 284 10*3/uL (ref 150–400)
RBC: 4.68 MIL/uL (ref 4.22–5.81)
RDW: 12.4 % (ref 11.5–15.5)
WBC: 7.3 10*3/uL (ref 4.0–10.5)
nRBC: 0 % (ref 0.0–0.2)

## 2019-01-30 LAB — HEMOGLOBIN A1C
Hgb A1c MFr Bld: 7.6 % — ABNORMAL HIGH (ref 4.8–5.6)
Mean Plasma Glucose: 171.42 mg/dL

## 2019-01-30 LAB — GLUCOSE, CAPILLARY: Glucose-Capillary: 134 mg/dL — ABNORMAL HIGH (ref 70–99)

## 2019-01-30 LAB — SURGICAL PCR SCREEN
MRSA, PCR: NEGATIVE
Staphylococcus aureus: POSITIVE — AB

## 2019-01-30 MED FILL — MUPIROCIN 2% OINTMENT: 2 | 5 days supply | Qty: 22 | Fill #0

## 2019-01-30 NOTE — Progress Notes (Signed)
PCR positive for staph. Rx for Mupirocin called in to [place]. Patient aware.

## 2019-01-30 NOTE — Progress Notes (Signed)
PCP - Dr. Alysia Penna Cardiologist - denies  Chest x-ray - N/A EKG - 01/29/18 Stress Test - denies ECHO - denies Cardiac Cath - denies  Sleep Study - h/o OSA CPAP - uses QHS- pressure setting 14  Fasting Blood Sugar - 130's Checks Blood Sugar- does not check CBG's   Aspirin Instructions: Patient instructed to hold all Aspirin, NSAID's, herbal medications, fish oil and vitamins 7 days prior to surgery.   Anesthesia review:   Patient denies shortness of breath, fever, cough and chest pain at PAT appointment   Patient verbalized understanding of instructions that were given to them at the PAT appointment. Patient was also instructed that they will need to review over the PAT instructions again at home before surgery.

## 2019-01-31 NOTE — Progress Notes (Signed)
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  History includes never smoker, hypertension, hypertriglyceridemia, traumatic injury eye with left eye blindness, DM2, OSA (CPAP), CKD (Cr 0.94, eGRF > 60 01/30/19), skin cancer, dental crowns. BMI is consistent with morbid obesity.  Preoperative EKG and labs acceptable for surgery.  A1c 7.6.  He denies shortness of breath, cough, fever, chest pain at PAT RN visit.  Based on currently available information I anticipate he could proceed as planned.  Preoperative COVID test is scheduled for 02/04/19.   VS: BP (P) 136/78   Pulse (P) 98   Temp (!) (P) 36.4 C   Resp (P) 20   Ht (P) 6' (1.829 m)   Wt (!) (P) 140.6 kg   SpO2 (P) 98%   BMI (P) 42.04 kg/m   PROVIDERS: Laurey Morale, MD is PCP   LABS: Labs reviewed: Acceptable for surgery. (all labs ordered are listed, but only abnormal results are displayed)  Labs Reviewed  SURGICAL PCR SCREEN - Abnormal; Notable for the following components:      Result Value   Staphylococcus aureus POSITIVE (*)    All other components within normal limits  GLUCOSE, CAPILLARY - Abnormal; Notable for the following components:   Glucose-Capillary 134 (*)    All other components within normal limits  BASIC METABOLIC PANEL - Abnormal; Notable for the following components:   Glucose, Bld 146 (*)    All other components within  normal limits  HEMOGLOBIN A1C - Abnormal; Notable for the following components:   Hgb A1c MFr Bld 7.6 (*)    All other components within normal limits  CBC  TYPE AND SCREEN    IMAGES: MRI L-spine 12/13/18: IMPRESSION: - L3-4: Broad-based acute disc herniation. Abnormal material in the ventral epidural space extending up behind L3 and down behind L4 which could be extruded disc material, epidural hematoma or both. There is facet degeneration and hypertrophy at this level. These factors combine to result in severe spinal stenosis that could cause neural compression on either or both sides. - L4-5: Advanced chronic facet arthropathy with 4 mm of anterolisthesis. Bulging of the disc. Stenosis of the lateral recesses and foramina that could be symptomatic. This appearance could worsen with standing or flexion.   EKG: 01/30/19: Normal sinus rhythm No significant change since last tracing Confirmed by Adrian Prows ([id number]) on 01/30/2019 8:05:01 PM   CV: N/A  Past Medical History:  Diagnosis Date  . 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 Chronic kidney disease   . 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 Dental crowns present   . Family history of adverse reaction to anesthesia    pt's father has hx. of post-op N/V  . High triglycerides    no current med.  Marland Kitchen History of gout   . History of kidney stones   . Hypertension    states  under control with med., has been on med. ~ 65 yr.  . Non-insulin dependent type 2 diabetes mellitus ([place])   . Obesity, Class III, BMI 40-49.9 (morbid obesity) ([place])   . OSA on CPAP   . Osteoarthritis    knees (09/14/2016)  . Sleep apnea   . 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 COLONOSCOPY  08/29/2005   per Dr. Sharlett Iles, diverticulosis only, repeat in 10 yrs   . CYSTOSCOPY  2011   "related to trace blood in urine"  . EYE SURGERY Left 1961   traumatic loss of eye   . INGUINAL HERNIA REPAIR Left   . JOINT REPLACEMENT    . KNEE ARTHROSCOPY Left 1990s?  Marland Kitchen KNEE ARTHROSCOPY WITH MEDIAL MENISECTOMY Right 11/08/2012   Procedure: RIGHT KNEE ARTHROSCOPY WITH PARTIAL MEDIAL AND LATERAL MENISECTOMIES, CHONDROPLASTY;  Surgeon: [name], MD;  Location: [facility];  Service: Orthopedics;  Laterality: Right;  RIGHT KNEE SCOPE WITH PARTIAL MEDIAL AND LATERAL  MENISCECTOMIES  AND CHONDROPLASTY  . KNEE CLOSED REDUCTION Left 08/18/2016   Procedure: LEFT  MANIPULATION KNEE;  Surgeon: [name], MD;  Location: [place];  Service: Orthopedics;  Laterality: Left;  . LIPOMA EXCISION Left    upper back  . MANIPULATION KNEE JOINT Left 09/14/2016   "he told my wife he did"  . MOHS SURGERY Left 10/2015   "forehead"  . RETINAL TEAR REPAIR CRYOTHERAPY Right 11/2017  . TOTAL KNEE ARTHROPLASTY Left 05/18/2016   Procedure: LEFT TOTAL KNEE ARTHROPLASTY;  Surgeon: [name], MD;  Location: [place];  Service: Orthopedics;  Laterality: Left;  . TOTAL KNEE ARTHROPLASTY Right 09/14/2016  . TOTAL KNEE ARTHROPLASTY Right 09/14/2016   Procedure: TOTAL KNEE ARTHROPLASTY MANIPULATION LEFT KNEE;  Surgeon: [name], MD;  Location: [facility];  Service: Orthopedics;  Laterality: Right;    MEDICATIONS: . cyclobenzaprine (FLEXERIL) 10 MG tablet  . diclofenac (VOLTAREN) 75 MG EC tablet  . fenofibrate 160 MG tablet  . hydrocortisone (ANUSOL-HC) 25 MG suppository  . lisinopril-hydrochlorothiazide (PRINZIDE,ZESTORETIC) 10-12.5 MG tablet  . LORazepam (ATIVAN) 0.5 MG tablet  . metFORMIN (GLUCOPHAGE) 500 MG tablet   No current facility-administered medications for this encounter.     Myra Gianotti, PA-C Surgical Short Stay/Anesthesiology [facility] Phone [phone] [facility] Phone [phone] 01/31/2019 2:15 PM

## 2019-01-31 NOTE — Anesthesia Preprocedure Evaluation (Addendum)
Anesthesia Evaluation  Patient identified by MRN, date of birth, ID band Patient awake    Reviewed: Allergy & Precautions, NPO status , Patient's Chart, lab work & pertinent test results  History of Anesthesia Complications Negative for: history of anesthetic complications  Airway Mallampati: III  TM Distance: >3 FB Neck ROM: Full    Dental  (+) Teeth Intact, Dental Advisory Given   Pulmonary sleep apnea and Continuous Positive Airway Pressure Ventilation ,    Pulmonary exam normal        Cardiovascular hypertension, Pt. on medications Normal cardiovascular exam     Neuro/Psych negative neurological ROS  negative psych ROS   GI/Hepatic negative GI ROS, Neg liver ROS,   Endo/Other  diabetes, Type 2, Oral Hypoglycemic AgentsMorbid obesity  Renal/GU Renal InsufficiencyRenal disease  negative genitourinary   Musculoskeletal  (+) Arthritis ,   Abdominal (+) + obese,   Peds  Hematology negative hematology ROS (+)   Anesthesia Other Findings   Reproductive/Obstetrics                        Anesthesia Physical Anesthesia Plan  ASA: III  Anesthesia Plan: General   Post-op Pain Management:    Induction: Intravenous  PONV Risk Score and Plan: 3 and Ondansetron, Dexamethasone, Midazolam and Treatment may vary due to age or medical condition  Airway Management Planned: Oral ETT  Additional Equipment: None  Intra-op Plan:   Post-operative Plan: Extubation in OR  Informed Consent: I have reviewed the patients History and Physical, chart, labs and discussed the procedure including the risks, benefits and alternatives for the proposed anesthesia with the patient or authorized representative who has indicated his/her understanding and acceptance.     Dental advisory given  Plan Discussed with:   Anesthesia Plan Comments:    Anesthesia Quick Evaluation

## 2019-02-01 MED FILL — metFORMIN HCL 500 MG TABS: 500 | 90 days supply | Qty: 180 | Fill #3

## 2019-02-04 ENCOUNTER — Other Ambulatory Visit (HOSPITAL_COMMUNITY)
Admission: RE | Admit: 2019-02-04 | Discharge: 2019-02-04 | Disposition: A | Discharge status: Home / Self Care | Payer: 59 | Source: Ambulatory Visit | Attending: Neurosurgery | Admitting: Neurosurgery

## 2019-02-04 DIAGNOSIS — Z1159 Encounter for screening for other viral diseases: Secondary | ICD-10-CM | POA: Diagnosis not present

## 2019-02-04 LAB — SARS CORONAVIRUS 2 (TAT 6-24 HRS): SARS Coronavirus 2: NEGATIVE

## 2019-02-06 ENCOUNTER — Other Ambulatory Visit: Payer: Self-pay | Admitting: Neurosurgery

## 2019-02-07 ENCOUNTER — Inpatient Hospital Stay (HOSPITAL_COMMUNITY)
Admission: RE | Admit: 2019-02-07 | Discharge: 2019-02-08 | DRG: 454 | Disposition: A | Discharge status: Home Health | Payer: 59 | Attending: Neurosurgery | Admitting: Neurosurgery

## 2019-02-07 ENCOUNTER — Inpatient Hospital Stay (HOSPITAL_COMMUNITY): Payer: 59

## 2019-02-07 ENCOUNTER — Inpatient Hospital Stay (HOSPITAL_COMMUNITY): Payer: 59 | Admitting: Anesthesiology

## 2019-02-07 ENCOUNTER — Inpatient Hospital Stay (HOSPITAL_COMMUNITY)
Admission: RE | Disposition: A | Discharge status: Home Health | Payer: Self-pay | Source: Home / Self Care | Attending: Neurosurgery

## 2019-02-07 ENCOUNTER — Other Ambulatory Visit: Payer: Self-pay

## 2019-02-07 ENCOUNTER — Inpatient Hospital Stay (HOSPITAL_COMMUNITY): Payer: 59 | Admitting: Vascular Surgery

## 2019-02-07 ENCOUNTER — Encounter (HOSPITAL_COMMUNITY): Payer: Self-pay | Admitting: *Deleted

## 2019-02-07 DIAGNOSIS — M5126 Other intervertebral disc displacement, lumbar region: Secondary | ICD-10-CM | POA: Diagnosis present

## 2019-02-07 DIAGNOSIS — Z85828 Personal history of other malignant neoplasm of skin: Secondary | ICD-10-CM | POA: Diagnosis not present

## 2019-02-07 DIAGNOSIS — Z87442 Personal history of urinary calculi: Secondary | ICD-10-CM

## 2019-02-07 DIAGNOSIS — Z96653 Presence of artificial knee joint, bilateral: Secondary | ICD-10-CM | POA: Diagnosis present

## 2019-02-07 DIAGNOSIS — M48062 Spinal stenosis, lumbar region with neurogenic claudication: Secondary | ICD-10-CM | POA: Diagnosis not present

## 2019-02-07 DIAGNOSIS — N189 Chronic kidney disease, unspecified: Secondary | ICD-10-CM | POA: Diagnosis not present

## 2019-02-07 DIAGNOSIS — M4316 Spondylolisthesis, lumbar region: Secondary | ICD-10-CM | POA: Diagnosis present

## 2019-02-07 DIAGNOSIS — G4733 Obstructive sleep apnea (adult) (pediatric): Secondary | ICD-10-CM | POA: Diagnosis present

## 2019-02-07 DIAGNOSIS — M7138 Other bursal cyst, other site: Secondary | ICD-10-CM | POA: Diagnosis present

## 2019-02-07 DIAGNOSIS — I129 Hypertensive chronic kidney disease with stage 1 through stage 4 chronic kidney disease, or unspecified chronic kidney disease: Secondary | ICD-10-CM | POA: Diagnosis not present

## 2019-02-07 DIAGNOSIS — Z6841 Body Mass Index (BMI) 40.0 and over, adult: Secondary | ICD-10-CM

## 2019-02-07 DIAGNOSIS — H5462 Unqualified visual loss, left eye, normal vision right eye: Secondary | ICD-10-CM | POA: Diagnosis present

## 2019-02-07 DIAGNOSIS — M47816 Spondylosis without myelopathy or radiculopathy, lumbar region: Secondary | ICD-10-CM | POA: Diagnosis not present

## 2019-02-07 DIAGNOSIS — E785 Hyperlipidemia, unspecified: Secondary | ICD-10-CM | POA: Diagnosis present

## 2019-02-07 DIAGNOSIS — Z419 Encounter for procedure for purposes other than remedying health state, unspecified: Secondary | ICD-10-CM

## 2019-02-07 DIAGNOSIS — Z7984 Long term (current) use of oral hypoglycemic drugs: Secondary | ICD-10-CM

## 2019-02-07 DIAGNOSIS — M5136 Other intervertebral disc degeneration, lumbar region: Secondary | ICD-10-CM | POA: Diagnosis not present

## 2019-02-07 DIAGNOSIS — E1122 Type 2 diabetes mellitus with diabetic chronic kidney disease: Secondary | ICD-10-CM | POA: Diagnosis not present

## 2019-02-07 DIAGNOSIS — Z9889 Other specified postprocedural states: Secondary | ICD-10-CM | POA: Insufficient documentation

## 2019-02-07 DIAGNOSIS — Z79899 Other long term (current) drug therapy: Secondary | ICD-10-CM

## 2019-02-07 HISTORY — PX: SPINE SURGERY: SHX786

## 2019-02-07 LAB — GLUCOSE, CAPILLARY
Glucose-Capillary: 201 mg/dL — ABNORMAL HIGH (ref 70–99)
Glucose-Capillary: 248 mg/dL — ABNORMAL HIGH (ref 70–99)
Glucose-Capillary: 257 mg/dL — ABNORMAL HIGH (ref 70–99)
Glucose-Capillary: 235 mg/dL — ABNORMAL HIGH (ref 70–99)
Glucose-Capillary: 232 mg/dL — ABNORMAL HIGH (ref 70–99)
Glucose-Capillary: 233 mg/dL — ABNORMAL HIGH (ref 70–99)
Glucose-Capillary: 260 mg/dL — ABNORMAL HIGH (ref 70–99)
Glucose-Capillary: 257 mg/dL — ABNORMAL HIGH (ref 70–99)

## 2019-02-07 SURGERY — POSTERIOR LUMBAR FUSION 2 LEVEL
Anesthesia: General | Site: Back

## 2019-02-07 MED ORDER — DEXAMETHASONE SODIUM PHOSPHATE 4 MG/ML IJ SOLN
INTRAMUSCULAR | Status: DC | PRN
  Administered 2019-02-07: 4 mg via INTRAVENOUS

## 2019-02-07 MED ORDER — LISINOPRIL-HYDROCHLOROTHIAZIDE 10-12.5 MG PO TABS: 1.0000 | ORAL_TABLET | Freq: Every day | ORAL | Status: DC

## 2019-02-07 MED ORDER — ONDANSETRON HCL 4 MG/2ML IJ SOLN: 4.0000 mg | Freq: Four times a day (QID) | INTRAMUSCULAR | Status: DC | PRN

## 2019-02-07 MED ORDER — SODIUM CHLORIDE 0.9 % IV SOLN
INTRAVENOUS | Status: DC | PRN
  Administered 2019-02-07: 25 ug/min via INTRAVENOUS

## 2019-02-07 MED ORDER — ALBUMIN HUMAN 5 % IV SOLN
INTRAVENOUS | Status: DC | PRN
  Administered 2019-02-07: 12:00:00 via INTRAVENOUS

## 2019-02-07 MED ORDER — HYDROMORPHONE HCL 1 MG/ML IJ SOLN: 0.2500 mg | INTRAMUSCULAR | Status: DC | PRN

## 2019-02-07 MED ORDER — METFORMIN HCL 500 MG PO TABS
500.0000 mg | ORAL_TABLET | Freq: Two times a day (BID) | ORAL | Status: DC
  Administered 2019-02-07 – 2019-02-08 (×2): 500 mg via ORAL
  Filled 2019-02-07: qty 1

## 2019-02-07 MED ORDER — KETOROLAC TROMETHAMINE 30 MG/ML IJ SOLN
30.0000 mg | Freq: Four times a day (QID) | INTRAMUSCULAR | Status: DC
  Administered 2019-02-07 – 2019-02-08 (×3): 30 mg via INTRAVENOUS
  Filled 2019-02-07 (×3): qty 1

## 2019-02-07 MED ORDER — INSULIN ASPART 100 UNIT/ML ~~LOC~~ SOLN
SUBCUTANEOUS | Status: AC
  Filled 2019-02-07: qty 1

## 2019-02-07 MED ORDER — SODIUM CHLORIDE 0.9 % IV SOLN: 250.0000 mL | INTRAVENOUS | Status: DC

## 2019-02-07 MED ORDER — OXYCODONE HCL 5 MG/5ML PO SOLN: 5.0000 mg | Freq: Once | ORAL | Status: DC | PRN

## 2019-02-07 MED ORDER — ACETAMINOPHEN 325 MG PO TABS: 650.0000 mg | ORAL_TABLET | ORAL | Status: DC | PRN

## 2019-02-07 MED ORDER — INSULIN ASPART 100 UNIT/ML ~~LOC~~ SOLN
4.0000 [IU] | Freq: Once | SUBCUTANEOUS | Status: AC
  Administered 2019-02-07: 11:00:00 4 [IU] via SUBCUTANEOUS
  Filled 2019-02-07: qty 0.04

## 2019-02-07 MED ORDER — OXYCODONE HCL 5 MG PO TABS: 5.0000 mg | ORAL_TABLET | Freq: Once | ORAL | Status: DC | PRN

## 2019-02-07 MED ORDER — HYDROXYZINE HCL 50 MG/ML IM SOLN: 50.0000 mg | INTRAMUSCULAR | Status: DC | PRN

## 2019-02-07 MED ORDER — KETAMINE HCL 50 MG/5ML IJ SOSY
PREFILLED_SYRINGE | INTRAMUSCULAR | Status: AC
  Filled 2019-02-07: qty 10

## 2019-02-07 MED ORDER — THROMBIN 20000 UNITS EX SOLR
CUTANEOUS | Status: AC
  Filled 2019-02-07: qty 20000

## 2019-02-07 MED ORDER — ALUM & MAG HYDROXIDE-SIMETH 200-200-20 MG/5ML PO SUSP: 30.0000 mL | Freq: Four times a day (QID) | ORAL | Status: DC | PRN

## 2019-02-07 MED ORDER — ONDANSETRON HCL 4 MG PO TABS: 4.0000 mg | ORAL_TABLET | Freq: Four times a day (QID) | ORAL | Status: DC | PRN

## 2019-02-07 MED ORDER — MIDAZOLAM HCL 5 MG/5ML IJ SOLN
INTRAMUSCULAR | Status: DC | PRN
  Administered 2019-02-07: 2 mg via INTRAVENOUS

## 2019-02-07 MED ORDER — INSULIN ASPART 100 UNIT/ML ~~LOC~~ SOLN
0.0000 [IU] | Freq: Every day | SUBCUTANEOUS | Status: DC
  Administered 2019-02-07: 3 [IU] via SUBCUTANEOUS

## 2019-02-07 MED ORDER — PHENYLEPHRINE 40 MCG/ML (10ML) SYRINGE FOR IV PUSH (FOR BLOOD PRESSURE SUPPORT)
PREFILLED_SYRINGE | INTRAVENOUS | Status: AC
  Filled 2019-02-07: qty 10

## 2019-02-07 MED ORDER — KETAMINE HCL 10 MG/ML IJ SOLN
INTRAMUSCULAR | Status: DC | PRN
  Administered 2019-02-07: 20 mg via INTRAVENOUS
  Administered 2019-02-07: 40 mg via INTRAVENOUS
  Administered 2019-02-07: 10 mg via INTRAVENOUS

## 2019-02-07 MED ORDER — THROMBIN 20000 UNITS EX SOLR
CUTANEOUS | Status: DC | PRN
  Administered 2019-02-07 (×2): 20 mL via TOPICAL

## 2019-02-07 MED ORDER — ACETAMINOPHEN 10 MG/ML IV SOLN
1000.0000 mg | Freq: Once | INTRAVENOUS | Status: AC
  Administered 2019-02-07: 1000 mg via INTRAVENOUS

## 2019-02-07 MED ORDER — ONDANSETRON HCL 4 MG/2ML IJ SOLN: 4.0000 mg | Freq: Once | INTRAMUSCULAR | Status: DC | PRN

## 2019-02-07 MED ORDER — MIDAZOLAM HCL 2 MG/2ML IJ SOLN
INTRAMUSCULAR | Status: AC
  Filled 2019-02-07: qty 2

## 2019-02-07 MED ORDER — FENOFIBRATE 160 MG PO TABS: 160.0000 mg | ORAL_TABLET | Freq: Every day | ORAL | Status: DC

## 2019-02-07 MED ORDER — HYDROCHLOROTHIAZIDE 12.5 MG PO CAPS: 12.5000 mg | ORAL_CAPSULE | Freq: Every day | ORAL | Status: DC

## 2019-02-07 MED ORDER — PHENOL 1.4 % MT LIQD: 1.0000 | OROMUCOSAL | Status: DC | PRN

## 2019-02-07 MED ORDER — SUCCINYLCHOLINE CHLORIDE 200 MG/10ML IV SOSY
PREFILLED_SYRINGE | INTRAVENOUS | Status: AC
  Filled 2019-02-07: qty 10

## 2019-02-07 MED ORDER — LIDOCAINE 2% (20 MG/ML) 5 ML SYRINGE
INTRAMUSCULAR | Status: DC | PRN
  Administered 2019-02-07: 100 mg via INTRAVENOUS

## 2019-02-07 MED ORDER — HYDROCODONE-ACETAMINOPHEN 5-325 MG PO TABS: 1.0000 | ORAL_TABLET | ORAL | Status: DC | PRN

## 2019-02-07 MED ORDER — LISINOPRIL 10 MG PO TABS: 10.0000 mg | ORAL_TABLET | Freq: Every day | ORAL | Status: DC

## 2019-02-07 MED ORDER — DEXTROSE 5 % IV SOLN
3.0000 g | Freq: Once | INTRAVENOUS | Status: AC
  Administered 2019-02-07 (×2): 3 g via INTRAVENOUS
  Filled 2019-02-07: qty 3000

## 2019-02-07 MED ORDER — BISACODYL 10 MG RE SUPP: 10.0000 mg | Freq: Every day | RECTAL | Status: DC | PRN

## 2019-02-07 MED ORDER — PHENYLEPHRINE 40 MCG/ML (10ML) SYRINGE FOR IV PUSH (FOR BLOOD PRESSURE SUPPORT)
PREFILLED_SYRINGE | INTRAVENOUS | Status: DC | PRN
  Administered 2019-02-07: 80 ug via INTRAVENOUS
  Administered 2019-02-07 (×2): 120 ug via INTRAVENOUS
  Administered 2019-02-07: 80 ug via INTRAVENOUS

## 2019-02-07 MED ORDER — SODIUM CHLORIDE 0.9 % IV SOLN
INTRAVENOUS | Status: DC | PRN
  Administered 2019-02-07 (×2): 500 mL

## 2019-02-07 MED ORDER — BUPIVACAINE HCL (PF) 0.5 % IJ SOLN
INTRAMUSCULAR | Status: DC | PRN
  Administered 2019-02-07: 20 mL

## 2019-02-07 MED ORDER — MORPHINE SULFATE (PF) 4 MG/ML IV SOLN: 4.0000 mg | INTRAVENOUS | Status: DC | PRN

## 2019-02-07 MED ORDER — ONDANSETRON HCL 4 MG/2ML IJ SOLN
INTRAMUSCULAR | Status: DC | PRN
  Administered 2019-02-07: 4 mg via INTRAVENOUS

## 2019-02-07 MED ORDER — PROPOFOL 10 MG/ML IV BOLUS
INTRAVENOUS | Status: DC | PRN
  Administered 2019-02-07: 200 mg via INTRAVENOUS

## 2019-02-07 MED ORDER — MAGNESIUM HYDROXIDE 400 MG/5ML PO SUSP: 30.0000 mL | Freq: Every day | ORAL | Status: DC | PRN

## 2019-02-07 MED ORDER — ROCURONIUM BROMIDE 10 MG/ML (PF) SYRINGE
PREFILLED_SYRINGE | INTRAVENOUS | Status: AC
  Filled 2019-02-07: qty 10

## 2019-02-07 MED ORDER — SODIUM CHLORIDE 0.9% FLUSH: 3.0000 mL | INTRAVENOUS | Status: DC | PRN

## 2019-02-07 MED ORDER — CEFAZOLIN SODIUM 1 G IJ SOLR
INTRAMUSCULAR | Status: AC
  Filled 2019-02-07: qty 30

## 2019-02-07 MED ORDER — LIDOCAINE 2% (20 MG/ML) 5 ML SYRINGE
INTRAMUSCULAR | Status: AC
  Filled 2019-02-07: qty 5

## 2019-02-07 MED ORDER — INSULIN ASPART 100 UNIT/ML ~~LOC~~ SOLN
6.0000 [IU] | Freq: Once | SUBCUTANEOUS | Status: AC
  Administered 2019-02-07: 6 [IU] via SUBCUTANEOUS
  Filled 2019-02-07: qty 0.06

## 2019-02-07 MED ORDER — CHLORHEXIDINE GLUCONATE CLOTH 2 % EX PADS: 6.0000 | MEDICATED_PAD | Freq: Once | CUTANEOUS | Status: DC

## 2019-02-07 MED ORDER — THROMBIN 5000 UNITS EX SOLR
CUTANEOUS | Status: AC
  Filled 2019-02-07: qty 10000

## 2019-02-07 MED ORDER — INSULIN ASPART 100 UNIT/ML ~~LOC~~ SOLN
6.0000 [IU] | SUBCUTANEOUS | Status: AC
  Administered 2019-02-07: 6 [IU] via SUBCUTANEOUS
  Filled 2019-02-07: qty 0.06

## 2019-02-07 MED ORDER — LACTATED RINGERS IV SOLN
INTRAVENOUS | Status: DC | PRN
  Administered 2019-02-07: 09:00:00 via INTRAVENOUS

## 2019-02-07 MED ORDER — SODIUM CHLORIDE 0.9 % IV SOLN
INTRAVENOUS | Status: DC | PRN
  Administered 2019-02-07: 12:00:00 via INTRAVENOUS

## 2019-02-07 MED ORDER — POTASSIUM CHLORIDE IN NACL 20-0.9 MEQ/L-% IV SOLN: INTRAVENOUS | Status: DC

## 2019-02-07 MED ORDER — HYDROXYZINE HCL 25 MG PO TABS: 50.0000 mg | ORAL_TABLET | ORAL | Status: DC | PRN

## 2019-02-07 MED ORDER — ONDANSETRON HCL 4 MG/2ML IJ SOLN
INTRAMUSCULAR | Status: AC
  Filled 2019-02-07: qty 2

## 2019-02-07 MED ORDER — ACETAMINOPHEN 650 MG RE SUPP: 650.0000 mg | RECTAL | Status: DC | PRN

## 2019-02-07 MED ORDER — THROMBIN 20000 UNITS EX KIT
PACK | CUTANEOUS | Status: AC
  Filled 2019-02-07: qty 1

## 2019-02-07 MED ORDER — LIDOCAINE-EPINEPHRINE 1 %-1:100000 IJ SOLN
INTRAMUSCULAR | Status: AC
  Filled 2019-02-07: qty 1

## 2019-02-07 MED ORDER — SODIUM CHLORIDE 0.9% FLUSH
3.0000 mL | Freq: Two times a day (BID) | INTRAVENOUS | Status: DC
  Administered 2019-02-07: 3 mL via INTRAVENOUS

## 2019-02-07 MED ORDER — SUCCINYLCHOLINE CHLORIDE 200 MG/10ML IV SOSY
PREFILLED_SYRINGE | INTRAVENOUS | Status: DC | PRN
  Administered 2019-02-07: 140 mg via INTRAVENOUS

## 2019-02-07 MED ORDER — FENTANYL CITRATE (PF) 100 MCG/2ML IJ SOLN
INTRAMUSCULAR | Status: DC | PRN
  Administered 2019-02-07: 50 ug via INTRAVENOUS
  Administered 2019-02-07: 100 ug via INTRAVENOUS
  Administered 2019-02-07 (×7): 50 ug via INTRAVENOUS

## 2019-02-07 MED ORDER — ACETAMINOPHEN 10 MG/ML IV SOLN
INTRAVENOUS | Status: AC
  Filled 2019-02-07: qty 100

## 2019-02-07 MED ORDER — KETOROLAC TROMETHAMINE 30 MG/ML IJ SOLN
INTRAMUSCULAR | Status: AC
  Administered 2019-02-08: 30 mg
  Filled 2019-02-07: qty 1

## 2019-02-07 MED ORDER — FLEET ENEMA 7-19 GM/118ML RE ENEM: 1.0000 | ENEMA | Freq: Once | RECTAL | Status: DC | PRN

## 2019-02-07 MED ORDER — CYCLOBENZAPRINE HCL 5 MG PO TABS: 5.0000 mg | ORAL_TABLET | Freq: Three times a day (TID) | ORAL | Status: DC | PRN

## 2019-02-07 MED ORDER — INSULIN ASPART 100 UNIT/ML ~~LOC~~ SOLN
0.0000 [IU] | Freq: Three times a day (TID) | SUBCUTANEOUS | Status: DC
  Administered 2019-02-07: 11 [IU] via SUBCUTANEOUS
  Administered 2019-02-08: 4 [IU] via SUBCUTANEOUS

## 2019-02-07 MED ORDER — CEFAZOLIN SODIUM-DEXTROSE 2-4 GM/100ML-% IV SOLN
2.0000 g | INTRAVENOUS | Status: DC
  Filled 2019-02-07: qty 100

## 2019-02-07 MED ORDER — PROPOFOL 10 MG/ML IV BOLUS
INTRAVENOUS | Status: AC
  Filled 2019-02-07: qty 40

## 2019-02-07 MED ORDER — LACTATED RINGERS IV SOLN
INTRAVENOUS | Status: DC | PRN
  Administered 2019-02-07 (×3): via INTRAVENOUS

## 2019-02-07 MED ORDER — KETOROLAC TROMETHAMINE 30 MG/ML IJ SOLN
30.0000 mg | Freq: Once | INTRAMUSCULAR | Status: AC
  Administered 2019-02-07: 30 mg via INTRAVENOUS

## 2019-02-07 MED ORDER — THROMBIN 5000 UNITS EX SOLR
OROMUCOSAL | Status: DC | PRN
  Administered 2019-02-07 (×2): 5 mL via TOPICAL

## 2019-02-07 MED ORDER — SUGAMMADEX SODIUM 200 MG/2ML IV SOLN
INTRAVENOUS | Status: DC | PRN
  Administered 2019-02-07: 275 mg via INTRAVENOUS

## 2019-02-07 MED ORDER — BUPIVACAINE HCL (PF) 0.5 % IJ SOLN
INTRAMUSCULAR | Status: AC
  Filled 2019-02-07: qty 30

## 2019-02-07 MED ORDER — LIDOCAINE-EPINEPHRINE 1 %-1:100000 IJ SOLN
INTRAMUSCULAR | Status: DC | PRN
  Administered 2019-02-07: 20 mL

## 2019-02-07 MED ORDER — ROCURONIUM BROMIDE 10 MG/ML (PF) SYRINGE
PREFILLED_SYRINGE | INTRAVENOUS | Status: DC | PRN
  Administered 2019-02-07: 20 mg via INTRAVENOUS
  Administered 2019-02-07: 30 mg via INTRAVENOUS
  Administered 2019-02-07: 20 mg via INTRAVENOUS
  Administered 2019-02-07: 30 mg via INTRAVENOUS
  Administered 2019-02-07: 70 mg via INTRAVENOUS

## 2019-02-07 MED ORDER — FENTANYL CITRATE (PF) 250 MCG/5ML IJ SOLN
INTRAMUSCULAR | Status: AC
  Filled 2019-02-07: qty 5

## 2019-02-07 MED ORDER — DEXAMETHASONE SODIUM PHOSPHATE 10 MG/ML IJ SOLN
INTRAMUSCULAR | Status: AC
  Filled 2019-02-07: qty 1

## 2019-02-07 MED ORDER — LACTATED RINGERS IV SOLN: INTRAVENOUS | Status: DC | PRN

## 2019-02-07 MED ORDER — MENTHOL 3 MG MT LOZG: 1.0000 | LOZENGE | OROMUCOSAL | Status: DC | PRN

## 2019-02-07 MED ORDER — 0.9 % SODIUM CHLORIDE (POUR BTL) OPTIME
TOPICAL | Status: DC | PRN
  Administered 2019-02-07 (×5): 1000 mL

## 2019-02-07 SURGICAL SUPPLY — 93 items
ADH SKN CLS APL DERMABOND .7 (GAUZE/BANDAGES/DRESSINGS) ×1
APL SKNCLS STERI-STRIP NONHPOA (GAUZE/BANDAGES/DRESSINGS)
BAG DECANTER FOR FLEXI CONT (MISCELLANEOUS) ×3 IMPLANT
BENZOIN TINCTURE PRP APPL 2/3 (GAUZE/BANDAGES/DRESSINGS) IMPLANT
BLADE CLIPPER SURG (BLADE) ×1 IMPLANT
BUR ACORN 6.0 ACORN (BURR) ×1 IMPLANT
BUR ACRON 5.0MM COATED (BURR) ×2 IMPLANT
BUR MATCHSTICK NEURO 3.0 LAGG (BURR) ×2 IMPLANT
CAGE POST LUM TRIT 11X23X9 6D (Cage) ×4 IMPLANT
CANISTER SUCT 3000ML PPV (MISCELLANEOUS) ×2 IMPLANT
CAP LCK SPNE (Orthopedic Implant) ×6 IMPLANT
CAP LOCK SPINE RADIUS (Orthopedic Implant) IMPLANT
CAP LOCKING (Orthopedic Implant) ×12 IMPLANT
CARTRIDGE OIL MAESTRO DRILL (MISCELLANEOUS) ×1 IMPLANT
CONT SPEC 4OZ CLIKSEAL STRL BL (MISCELLANEOUS) ×2 IMPLANT
COVER BACK TABLE 60X90IN (DRAPES) ×2 IMPLANT
COVER WAND RF STERILE (DRAPES) ×1 IMPLANT
DECANTER SPIKE VIAL GLASS SM (MISCELLANEOUS) ×2 IMPLANT
DERMABOND ADVANCED (GAUZE/BANDAGES/DRESSINGS) ×1
DERMABOND ADVANCED .7 DNX12 (GAUZE/BANDAGES/DRESSINGS) ×1 IMPLANT
DIFFUSER DRILL AIR PNEUMATIC (MISCELLANEOUS) ×2 IMPLANT
DRAPE C-ARM 42X72 X-RAY (DRAPES) ×3 IMPLANT
DRAPE C-ARMOR (DRAPES) ×1 IMPLANT
DRAPE HALF SHEET 40X57 (DRAPES) ×1 IMPLANT
DRAPE LAPAROTOMY 100X72X124 (DRAPES) ×2 IMPLANT
DRAPE POUCH INSTRU U-SHP 10X18 (DRAPES) ×2 IMPLANT
ELECT BLADE 4.0 EZ CLEAN MEGAD (MISCELLANEOUS) ×2
ELECT REM PT RETURN 9FT ADLT (ELECTROSURGICAL) ×2
ELECTRODE BLDE 4.0 EZ CLN MEGD (MISCELLANEOUS) IMPLANT
ELECTRODE REM PT RTRN 9FT ADLT (ELECTROSURGICAL) ×1 IMPLANT
GAUZE 4X4 16PLY RFD (DISPOSABLE) ×2 IMPLANT
GAUZE SPONGE 4X4 12PLY STRL (GAUZE/BANDAGES/DRESSINGS) ×2 IMPLANT
GLOVE BIO SURGEON STRL SZ 6.5 (GLOVE) ×7 IMPLANT
GLOVE BIO SURGEON STRL SZ7 (GLOVE) ×1 IMPLANT
GLOVE BIOGEL PI IND STRL 6.5 (GLOVE) IMPLANT
GLOVE BIOGEL PI IND STRL 7.0 (GLOVE) IMPLANT
GLOVE BIOGEL PI IND STRL 7.5 (GLOVE) IMPLANT
GLOVE BIOGEL PI IND STRL 8 (GLOVE) ×2 IMPLANT
GLOVE BIOGEL PI IND STRL 8.5 (GLOVE) IMPLANT
GLOVE BIOGEL PI INDICATOR 6.5 (GLOVE) ×4
GLOVE BIOGEL PI INDICATOR 7.0 (GLOVE) ×1
GLOVE BIOGEL PI INDICATOR 7.5 (GLOVE) ×2
GLOVE BIOGEL PI INDICATOR 8 (GLOVE) ×2
GLOVE BIOGEL PI INDICATOR 8.5 (GLOVE) ×1
GLOVE ECLIPSE 7.5 STRL STRAW (GLOVE) ×4 IMPLANT
GLOVE ECLIPSE 8.5 STRL (GLOVE) ×1 IMPLANT
GOWN STRL REUS W/ TWL LRG LVL3 (GOWN DISPOSABLE) IMPLANT
GOWN STRL REUS W/ TWL XL LVL3 (GOWN DISPOSABLE) ×2 IMPLANT
GOWN STRL REUS W/TWL 2XL LVL3 (GOWN DISPOSABLE) ×1 IMPLANT
GOWN STRL REUS W/TWL LRG LVL3 (GOWN DISPOSABLE) ×8
GOWN STRL REUS W/TWL XL LVL3 (GOWN DISPOSABLE) ×4
HEMOSTAT POWDER KIT SURGIFOAM (HEMOSTASIS) ×2 IMPLANT
KIT BASIN OR (CUSTOM PROCEDURE TRAY) ×2 IMPLANT
KIT INFUSE MEDIUM (Orthopedic Implant) ×1 IMPLANT
KIT TURNOVER KIT B (KITS) ×2 IMPLANT
NDL 18GX1X1/2 (RX/OR ONLY) (NEEDLE) ×1 IMPLANT
NDL ASP BONE MRW 8GX15 (NEEDLE) IMPLANT
NDL SPNL 18GX3.5 QUINCKE PK (NEEDLE) ×1 IMPLANT
NDL SPNL 22GX3.5 QUINCKE BK (NEEDLE) ×1 IMPLANT
NEEDLE 18GX1X1/2 (RX/OR ONLY) (NEEDLE) IMPLANT
NEEDLE ASP BONE MRW 8GX15 (NEEDLE) ×2 IMPLANT
NEEDLE SPNL 18GX3.5 QUINCKE PK (NEEDLE) ×4 IMPLANT
NEEDLE SPNL 22GX3.5 QUINCKE BK (NEEDLE) ×2 IMPLANT
NS IRRIG 1000ML POUR BTL (IV SOLUTION) ×6 IMPLANT
OIL CARTRIDGE MAESTRO DRILL (MISCELLANEOUS) ×2
PACK LAMINECTOMY NEURO (CUSTOM PROCEDURE TRAY) ×2 IMPLANT
PAD ARMBOARD 7.5X6 YLW CONV (MISCELLANEOUS) ×7 IMPLANT
PATTIES SURGICAL .5 X.5 (GAUZE/BANDAGES/DRESSINGS) ×1 IMPLANT
PATTIES SURGICAL .5 X1 (DISPOSABLE) ×1 IMPLANT
PATTIES SURGICAL 1X1 (DISPOSABLE) ×3 IMPLANT
ROD 70MM (Rod) ×4 IMPLANT
ROD SPNL 70X5.5XNS TI RDS (Rod) IMPLANT
SCREW 5.75X45MM (Screw) ×4 IMPLANT
SCREW 5.75X50MM (Screw) ×2 IMPLANT
SPONGE LAP 4X18 RFD (DISPOSABLE) ×1 IMPLANT
SPONGE NEURO XRAY DETECT 1X3 (DISPOSABLE) ×3 IMPLANT
SPONGE SURGIFOAM ABS GEL 100 (HEMOSTASIS) ×2 IMPLANT
STAPLER SKIN PROX WIDE 3.9 (STAPLE) IMPLANT
STRIP BIOACTIVE VITOSS 25X100X (Neuro Prosthesis/Implant) ×3 IMPLANT
STRIP CLOSURE SKIN 1/2X4 (GAUZE/BANDAGES/DRESSINGS) IMPLANT
SUT PROLENE 6 0 BV (SUTURE) IMPLANT
SUT VIC AB 1 CT1 18XBRD ANBCTR (SUTURE) ×2 IMPLANT
SUT VIC AB 1 CT1 8-18 (SUTURE) ×6
SUT VIC AB 2-0 CP2 18 (SUTURE) ×5 IMPLANT
SYR 3ML LL SCALE MARK (SYRINGE) ×4 IMPLANT
SYR 5ML LL (SYRINGE) IMPLANT
SYR CONTROL 10ML LL (SYRINGE) ×2 IMPLANT
TAPE CLOTH SURG 4X10 WHT LF (GAUZE/BANDAGES/DRESSINGS) ×1 IMPLANT
TOWEL GREEN STERILE (TOWEL DISPOSABLE) ×2 IMPLANT
TOWEL GREEN STERILE FF (TOWEL DISPOSABLE) ×2 IMPLANT
TRAP SPECIMEN MUCOUS 40CC (MISCELLANEOUS) IMPLANT
TRAY FOLEY MTR SLVR 16FR STAT (SET/KITS/TRAYS/PACK) ×2 IMPLANT
WATER STERILE IRR 1000ML POUR (IV SOLUTION) ×2 IMPLANT

## 2019-02-07 NOTE — Op Note (Signed)
02/07/2019  3:14 PM  PATIENT:  Jermaine James.  63 y.o. male  PRE-OPERATIVE DIAGNOSIS: Multilevel, multifactorial lumbar stenosis with neurogenic claudication; L4-5 grade 2 dynamic degenerative spondylolisthesis; L3-4 lumbar disc herniation; lumbar spondylosis; lumbar degenerative disc disease  POST-OPERATIVE DIAGNOSIS:  Multilevel, multifactorial lumbar stenosis with neurogenic claudication; L4-5 grade 2 dynamic degenerative spondylolisthesis; L3-4 lumbar disc herniation; bilateral calcified L4-5 synovial cyst; lumbar spondylosis; lumbar degenerative disc disease  PROCEDURE:  Procedure(s): Bilateral L3-4 and L4-5 lumbar decompression and stabilization including L3-L5 decompressive lumbar laminectomy, bilateral L3-4 and L4-5 facetectomy, bilateral L3, L4, and L5 foraminotomies for decompression of severe canal, lateral recess, and neuroforaminal stenosis with decompression of the exiting L3, L4, and L5 nerve roots bilaterally , with decompression beyond that required for interbody arthrodesis, bilateral L3-4 and L4-5 posterior lumbar interbody arthrodesis with Tritanium interbody implants, locally harvested morselized autograft, Vitoss BA with bone marrow aspirate, and infuse, bilateral L3-L5 posterior lateral arthrodesis with segmental radius posterior instrumentation, locally harvested morselized autograft, Vitoss BA with bone marrow aspirate, and infuse  SURGEON: Jovita Gamma, MD  ASSISTANTS: Kristeen Miss, MD  ANESTHESIA:   general  EBL:  Total I/O In: 3550 [I.V.:2850; Blood:450; IV Piggyback:250] Out: 48 [Urine:605; Blood:750]  BLOOD ADMINISTERED:450 CC CELLSAVER  COUNT: Correct per nursing staff  DICTATION: Patient was brought to the operating room placed under general endotracheal anesthesia. The patient was turned to prone position, the lumbar region was prepped with Betadine soap and solution and draped in a sterile fashion. The midline was infiltrated with local anesthesia  with epinephrine. A localizing x-ray was taken and then a midline incision was made and carried down through the subcutaneous tissue, bipolar cautery and electrocautery were used to maintain hemostasis. Dissection was carried down to the lumbar fascia. The fascia was incised bilaterally and the paraspinal muscles were dissected with a spinous process and lamina in a subperiosteal fashion. Another x-ray was taken for localization and the L3, L4, and L5 levels were localized. Dissection was then carried out laterally over the markedly hypertrophic facet complexes and the transverse processes of L3, L4, L5 were exposed and decorticated.  We then proceeded with the decompression.  Laminectomy was performed using double-action rongeurs, the high-speed drill and Kerrison punches.  Bone was saved and later morselized to be implanted as locally harvested morselized autograft.  We found large calcified and partially adherent synovial cysts bilaterally at the L4-5 level, that were carefully dissected from the thecal sac, mobilized, and resected.  Dissection was carried out laterally including facetectomy and foraminotomies with decompression of the stenotic compression of the L3, L4, and L5 nerve roots. Once the decompression of the stenotic compression of the thecal sac and exiting nerve roots was completed we proceeded with the posterior lumbar interbody arthrodesis. The annulus at each level was incised bilaterally and the disc space entered. A thorough discectomy was performed using pituitary rongeurs and curettes.  Particular effort was placed in removing the large L3-4 spondylitic disc herniation that extended rostral and caudal to the disc space.  Once good decompression had been achieved and the discectomy was completed we began to prepare the endplate surfaces, removing the cartilaginous endplates surface. We then measured the height of the intervertebral disc space. We selected 11 x 23 x 6 x 9 titanium interbody  implants for both levels.    The C-arm fluoroscope was then draped and brought in the field and we identified the pedicle entry points bilaterally at the L3, L4, and L5 levels. Each of the 6  pedicles was probed, we aspirated bone marrow aspirate from the vertebral bodies, this was injected over three 10 cc strips of Vitoss BA. Then each of the pedicles was examined with the ball probe, good bony surfaces were found and no bony cuts were found. Each of the pedicles was then tapped with a 5.25 mm tap, again examined with the ball probe good threading was found and no bony cuts were found. We then placed 5.75 by 50 millimeter screws bilaterally at the L3 level, 5.75 by 45 millimeter screws bilaterally at the L4 level, and 5.75 by 45 millimeter screws bilaterally at the L5 level.  We then packed the interbody implants with Vitoss BA with bone marrow aspirate and infuse, and then placed the first implant at the L4-5 level on the right side, carefully retracting the thecal sac and nerve root medially. We then went back to the left side and packed the midline with locally harvested morselized autograft, Vitoss BA with bone marrow aspirate, and infuse, and then placed a second implant on the left side again retracting the thecal sac and nerve root medially. Additional Vitoss BA with bone marrow aspirate infuse was packed lateral to the implants.  Then at the L3-4 level, we placed the first implant on the right side, carefully retracting the thecal sac and nerve root medially. We then went back to the left side and packed the midline with Vitoss BA with bone marrow aspirate and infuse, and then placed a second implant on the left side, again retracting the thecal sac and nerve root medially.   We then packed the lateral gutter over the transverse processes and intertransverse space with locally harvested morselized autograft, Vitoss BA with bone marrow aspirate, and infuse. We then selected 70 mm pre-lordosed rods,  they were placed within the screw heads and secured with locking caps once all 6 locking caps were placed final tightening was performed against a counter torque.  The wound had been irrigated multiple times during the procedure with saline solution and bacitracin solution, good hemostasis was established with a combination of bipolar cautery and Gelfoam with thrombin.  The Gelfoam was removed, and a thin layer of Surgifoam applied.  Once good hemostasis was confirmed we proceeded with closure paraspinal muscles deep fascia and Scarpa's fascia were closed with interrupted undyed 1 Vicryl sutures the subcutaneous and subcuticular closed with interrupted inverted 2-0 undyed Vicryl sutures the skin edges were approximated with Dermabond.  The wound was dressed with sterile gauze and Hypafix.  Following surgery the patient was turned back to the supine position to be reversed and the anesthetic extubated and transferred to the recovery room for further care.   PLAN OF CARE: Admit to inpatient   PATIENT DISPOSITION:  PACU - hemodynamically stable.   Delay start of Pharmacological VTE agent (>24hrs) due to surgical blood loss or risk of bleeding:  yes

## 2019-02-07 NOTE — Transfer of Care (Signed)
Immediate Anesthesia Transfer of Care Note  Patient: Jermaine James.  Procedure(s) Performed: LUMBAR THREE- LUMBAR FIVE DECOMPRESSION, LUMBAR THREE- LUMBAR FOUR, LUMBAR FOUR- LUMBAR FIVE POSTERIOR LUMBAR INTERBODY FUSION, LUMBAR THREE- LUMBAR FIVE POSTERIOR LATERAL ARTHRODESIS (N/A Back)  Patient Location: PACU  Anesthesia Type:General  Level of Consciousness: sedated and patient cooperative  Airway & Oxygen Therapy: Patient Spontanous Breathing and Patient connected to face mask oxygen  Post-op Assessment: Report given to RN and Post -op Vital signs reviewed and stable  Post vital signs: Reviewed  Last Vitals:  Vitals Value Taken Time  BP 125/64 02/07/19 1529  Temp 36.8 C 02/07/19 1530  Pulse 110 02/07/19 1535  Resp 17 02/07/19 1535  SpO2 98 % 02/07/19 1535  Vitals shown include unvalidated device data.  Last Pain:  Vitals:   02/07/19 0655  TempSrc: Oral  PainSc:       Patients Stated Pain Goal: 2 (85/92/92 [id number])  Complications: No apparent anesthesia complications

## 2019-02-07 NOTE — Anesthesia Postprocedure Evaluation (Signed)
Anesthesia Post Note  Patient: Jermaine James.  Procedure(s) Performed: LUMBAR THREE- LUMBAR FIVE DECOMPRESSION, LUMBAR THREE- LUMBAR FOUR, LUMBAR FOUR- LUMBAR FIVE POSTERIOR LUMBAR INTERBODY FUSION, LUMBAR THREE- LUMBAR FIVE POSTERIOR LATERAL ARTHRODESIS (N/A Back)     Patient location during evaluation: PACU Anesthesia Type: General Level of consciousness: awake and alert Pain management: pain level controlled Vital Signs Assessment: post-procedure vital signs reviewed and stable Respiratory status: spontaneous breathing, nonlabored ventilation, respiratory function stable and patient connected to nasal cannula oxygen Cardiovascular status: blood pressure returned to baseline and stable Postop Assessment: no apparent nausea or vomiting Anesthetic complications: no    Last Vitals:  Vitals:   02/07/19 1700 02/07/19 1945  BP: 129/78 107/69  Pulse: (!) 105 (!) 110  Resp: 13 20  Temp: 36.7 C 36.7 C  SpO2: 96% 95%    Last Pain:  Vitals:   02/07/19 1945  TempSrc: Oral  PainSc:                  Ryan P Ellender

## 2019-02-07 NOTE — H&P (Signed)
Subjective: Patient is a 63 y.o. right-handed male who is admitted for treatment of multilevel, multifactorial lumbar stenosis with neurogenic claudication.  Patient developed severe pain across the low back and extending into the buttocks and posterior thighs, consistent with neurogenic claudication, about 2-1/2 months ago.  He was evaluated with MRI scan which showed a very large L3-4 lumbar disc herniation with severe multifactorial lumbar stenosis and L4-5 spondylolisthesis with significant stenosis, right worse than left.  X-rays showed a grade 2 dynamic degenerative spondylolisthesis of L4 on 5.  He was treated with epidural steroid injection without significant relief.  Patient is admitted now for decompression and stabilization, specifically a L3-L5 decompressive lumbar laminectomy, bilateral L3-4 and L4-5 facetectomy, and foraminotomies for the exiting L3, L4, and L5 nerve roots, bilateral L3-4 and L4-5 posterior lumbar interbody arthrodesis with interbody implants and bone graft, and bilateral L3-L5 posterior lateral arthrodesis with posterior instrumentation and bone graft.   Patient Active Problem List   Diagnosis Date Noted  . Rectal bleed 05/13/2018  . Primary localized osteoarthritis of right knee 09/14/2016  . S/P total knee arthroplasty 05/18/2016  . OSA (obstructive sleep apnea) 07/03/2012  . Cellulitis 07/01/2012  . Primary osteoarthritis of left knee 10/28/2009  . MICROSCOPIC HEMATURIA 08/04/2009  . ACHILLES TENDINITIS 07/02/2008  . GOUT 10/02/2007  . PSA, INCREASED 08/13/2007  . Type 2 diabetes mellitus ([place]) 04/20/2007  . HYPERLIPIDEMIA 04/20/2007  . SKIN TAG 04/20/2007  . Essential hypertension 03/26/2007  . ALLERGIC RHINITIS 03/26/2007   Past Medical History:  Diagnosis Date  . 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 Chronic kidney disease   . 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 Dental crowns present   . Family history of adverse reaction to  anesthesia    pt's father has hx. of post-op N/V  . High triglycerides    no current med.  Marland Kitchen History of gout   . History of kidney stones   . Hypertension    states under control with med., has been on med. ~ 38 yr.  . Non-insulin dependent type 2 diabetes mellitus ([place])   . Obesity, Class III, BMI 40-49.9 (morbid obesity) ([place])   . OSA on CPAP   . Osteoarthritis    knees (09/14/2016)  . Sleep apnea   . 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 COLONOSCOPY  08/29/2005   per Dr. Sharlett Iles, diverticulosis only, repeat in 10 yrs   . CYSTOSCOPY  2011   "related to trace blood in urine"  . EYE SURGERY Left 1961   traumatic loss of eye  . INGUINAL HERNIA REPAIR Left   . JOINT REPLACEMENT    . KNEE ARTHROSCOPY Left 1990s?  Marland Kitchen KNEE ARTHROSCOPY WITH MEDIAL MENISECTOMY Right 11/08/2012   Procedure: RIGHT KNEE ARTHROSCOPY WITH PARTIAL MEDIAL AND LATERAL MENISECTOMIES, CHONDROPLASTY;  Surgeon: [name], MD;  Location: [facility];  Service: Orthopedics;  Laterality: Right;  RIGHT KNEE SCOPE WITH PARTIAL MEDIAL AND LATERAL  MENISCECTOMIES  AND CHONDROPLASTY  . KNEE CLOSED REDUCTION Left 08/18/2016   Procedure: LEFT  MANIPULATION KNEE;  Surgeon: [name], MD;  Location: [facility];  Service: Orthopedics;  Laterality: Left;  . LIPOMA EXCISION Left    upper back  . MANIPULATION KNEE JOINT Left 09/14/2016   "he told my wife he did"  . MOHS SURGERY Left 10/2015   "  forehead"  . RETINAL TEAR REPAIR CRYOTHERAPY Right 11/2017  . TOTAL KNEE ARTHROPLASTY Left 05/18/2016   Procedure: LEFT TOTAL KNEE ARTHROPLASTY;  Surgeon: [name], MD;  Location: [facility];  Service: Orthopedics;  Laterality: Left;  . TOTAL KNEE ARTHROPLASTY Right 09/14/2016  . TOTAL KNEE ARTHROPLASTY Right 09/14/2016   Procedure: TOTAL KNEE ARTHROPLASTY MANIPULATION LEFT KNEE;  Surgeon: [name], MD;  Location: [facility];  Service: Orthopedics;  Laterality: Right;    Medications Prior to Admission  Medication Sig Dispense Refill Last Dose  . cyclobenzaprine (FLEXERIL) 10 MG tablet Take 1 tablet (10 mg total) by mouth 3 (three) times daily as needed for muscle spasms. 60 tablet 2 Past Month at Unknown time  . fenofibrate 160 MG tablet Take 1 tablet (160 mg total) by mouth daily. 90 tablet 3 Past Week at Unknown time  . lisinopril-hydrochlorothiazide (PRINZIDE,ZESTORETIC) 10-12.5 MG tablet Take 1 tablet by mouth daily. 90 tablet 3 02/06/2019 at Unknown time  . metFORMIN (GLUCOPHAGE) 500 MG tablet TAKE 1 TABLET (500 MG TOTAL) BY MOUTH 2 (TWO) TIMES DAILY. (Patient taking differently: Take 500 mg by mouth 2 (two) times daily with a meal. ) 180 tablet 3 02/06/2019 at Unknown time  . diclofenac (VOLTAREN) 75 MG EC tablet Take 1 tablet (75 mg total) by mouth 2 (two) times daily. (Patient taking differently: Take 75 mg by mouth daily as needed for moderate pain. ) 60 tablet 2 More than a month at Unknown time  . hydrocortisone (ANUSOL-HC) 25 MG suppository Place 1 suppository (25 mg total) rectally 2 (two) times daily. (Patient not taking: Reported on 01/28/2019) 12 suppository 0 Not Taking at Unknown time  . LORazepam (ATIVAN) 0.5 MG tablet Take one tab 45 min prior to procedure and another tab 5 min prior to procedure if needed (Patient not taking: Reported on 01/28/2019) 2 tablet 0 Not Taking at Unknown time   No Known Allergies  Social History   Tobacco Use  . Smoking status: Never Smoker  . Smokeless tobacco: Never Used  Substance Use Topics  . Alcohol use: Yes    Alcohol/week: 3.0 - 4.0 standard drinks    Types: 1 - 2 Standard drinks or equivalent, 1 Cans of beer, 1 Glasses of wine per week    Family History  Problem Relation Age of Onset  . Anesthesia problems Father        post-op N/V  . Colon cancer Neg Hx   . Esophageal cancer Neg Hx   . Rectal cancer Neg Hx   . Stomach cancer Neg Hx      Review of  Systems Pertinent items noted in HPI and remainder of comprehensive ROS otherwise negative.  Objective: Vital signs in last 24 hours: Temp:  [98.2 F (36.8 C)] 98.2 F (36.8 C) (06/25 0655) Pulse Rate:  [105] 105 (06/25 0655) Resp:  [18] 18 (06/25 0655) BP: (145)/(84) 145/84 (06/25 0655) SpO2:  [96 %] 96 % (06/25 0655) Weight:  [139.7 kg] 139.7 kg (06/25 0655)  EXAM: Is an obese white male in discomfort, but no acute distress.   Lungs are clear to auscultation , the patient has symmetrical respiratory excursion. Heart has a regular rate and rhythm normal S1 and S2 no murmur.   Abdomen is soft nontender nondistended bowel sounds are present. Extremity examination shows no clubbing cyanosis or edema. Motor examination shows 5 over 5 strength in the lower extremities including the iliopsoas quadriceps dorsiflexor extensor hallicus  longus and plantar flexor bilaterally. Sensation  is intact to pinprick in the distal lower extremities. Reflexes are symmetrical bilaterally. No pathologic reflexes are present. Patient has a normal gait and stance.  Data Review:CBC    Component Value Date/Time   WBC 7.3 01/30/2019 0936   RBC 4.68 01/30/2019 0936   HGB 14.2 01/30/2019 0936   HCT 43.1 01/30/2019 0936   PLT 284 01/30/2019 0936   MCV 92.1 01/30/2019 0936   MCH 30.3 01/30/2019 0936   MCHC 32.9 01/30/2019 0936   RDW 12.4 01/30/2019 0936   LYMPHSABS 1.8 02/01/2018 0916   MONOABS 0.7 02/01/2018 0916   EOSABS 0.6 02/01/2018 0916   BASOSABS 0.1 02/01/2018 0916                          BMET    Component Value Date/Time   NA 136 01/30/2019 0936   K 4.0 01/30/2019 0936   CL 101 01/30/2019 0936   CO2 23 01/30/2019 0936   GLUCOSE 146 (H) 01/30/2019 0936   BUN 15 01/30/2019 0936   CREATININE 0.94 01/30/2019 0936   CALCIUM 9.5 01/30/2019 0936   GFRNONAA >60 01/30/2019 0936   GFRAA >60 01/30/2019 0936     Assessment/Plan: Patient with multilevel, multifactorial lumbar stenosis who is  admitted for lumbar decompression and stabilization.  He has had persistent disabling pain across low back and into the buttocks and posterior thighs bilaterally consistent with neurogenic claudication.  I've discussed with the patient the nature of his condition, the nature the surgical procedure, the typical length of surgery, hospital stay, and overall recuperation, the limitations postoperatively, and risks of surgery. I discussed risks including risks of infection, bleeding, possibly need for transfusion, the risk of nerve root dysfunction with pain, weakness, numbness, or paresthesias, the risk of dural tear and CSF leakage and possible need for further surgery, the risk of failure of the arthrodesis and possibly for further surgery, the risk of anesthetic complications including myocardial infarction, stroke, pneumonia, and death. We discussed the need for postoperative immobilization in a lumbar brace. Understanding all this the patient does wish to proceed with surgery and is admitted for such.   Hosie Spangle, MD 02/07/2019 7:20 AM

## 2019-02-07 NOTE — Anesthesia Procedure Notes (Addendum)
Procedure Name: Intubation Date/Time: 02/07/2019 7:37 AM Performed by: Jenne Campus, CRNA Pre-anesthesia Checklist: Patient identified, Emergency Drugs available, Suction available and Patient being monitored Patient Re-evaluated:Patient Re-evaluated prior to induction Oxygen Delivery Method: Circle System Utilized Preoxygenation: Pre-oxygenation with 100% oxygen Induction Type: IV induction Laryngoscope Size: Glidescope and 4 Grade View: Grade I Tube type: Oral Tube size: 8.0 mm Number of attempts: 1 Airway Equipment and Method: Stylet and Video-laryngoscopy Placement Confirmation: ETT inserted through vocal cords under direct vision,  positive ETCO2 and breath sounds checked- equal and bilateral Secured at: 24 cm Tube secured with: Tape Dental Injury: Teeth and Oropharynx as per pre-operative assessment  Difficulty Due To: Difficult Airway- due to limited oral opening, Difficulty was anticipated and Difficult Airway- due to large tongue

## 2019-02-07 NOTE — Progress Notes (Signed)
Orthopedic Tech Progress Note Patient Details:  Jermaine James 09-15-1955 [id number] Patient was supplied brace before surgery Patient ID: Jermaine Steele., male   DOB: 1956-05-27, 63 y.o.   MRN: [id number]   Jermaine James 02/07/2019, 6:09 PM

## 2019-02-07 NOTE — Progress Notes (Signed)
Vitals:   02/07/19 1615 02/07/19 1630 02/07/19 1645 02/07/19 1700  BP: 123/76 122/74 124/80 129/78  Pulse: (!) 109 (!) 107 (!) 106 (!) 105  Resp: 18 15 13 13   Temp: 98.4 F (36.9 C)   98 F (36.7 C)  TempSrc:      SpO2: 95% 96% 94% 96%  Weight:      Height:       Patient resting in bed, has eaten dinner.  Has not yet ambulated.  Dressing clean and dry.  No significant pain or discomfort, lower extremities feel good.  Plan: Spoke with patient and his nurse about at least 2 walks in the hallways this evening before bedtime.  Strongly encouraged to ambulate.  Will DC Foley once up and ambulating.  Then monitor voiding function.  Continue to progress through postoperative recovery.  Patient's wife has requested that we arrange for home health PT and OT.  We will request inpatient PT and OT consultations, to then transition to HHPT/OT.  Hosie Spangle, MD 02/07/2019, 7:05 PM

## 2019-02-08 LAB — GLUCOSE, CAPILLARY: Glucose-Capillary: 163 mg/dL — ABNORMAL HIGH (ref 70–99)

## 2019-02-08 MED ORDER — HYDROCODONE-ACETAMINOPHEN 5-325 MG PO TABS: 1.0000 | ORAL_TABLET | ORAL | 0 refills | Status: DC | PRN

## 2019-02-08 MED FILL — HYDROCODON-APAP 5-325: 5-325 | 4 days supply | Qty: 40 | Fill #0

## 2019-02-08 MED FILL — Gelatin Absorbable MT Powder: OROMUCOSAL | Qty: 1 | Status: AC

## 2019-02-08 MED FILL — Gelatin Absorbable Sponge Size 100: CUTANEOUS | Qty: 1 | Status: AC

## 2019-02-08 MED FILL — Thrombin For Soln Kit 20000 Unit: CUTANEOUS | Qty: 1 | Status: AC

## 2019-02-08 MED FILL — Thrombin For Soln 5000 Unit: CUTANEOUS | Qty: 5000 | Status: AC

## 2019-02-08 NOTE — TOC Transition Note (Signed)
Transition of Care [facility]) - CM/SW Discharge Note   Patient Details  Name: Jermaine James. MRN: [id number] Date of Birth: 02/20/1956  Transition of Care [facility]) CM/SW Contact:  Fuller Mandril, RN Phone Number: 02/08/2019, 9:57 AM   Clinical Narrative:    [facility] met with pt at bedside regarding home health services.   Final next level of care: [place] Barriers to Discharge: Barriers Resolved   Patient Goals and CMS Choice Patient states their goals for this hospitalization and ongoing recovery are:: recover from back surgery CMS Medicare.gov Compare Post Acute Care list provided to:: Patient Represenative (must comment) Choice offered to / list presented to : Patient  Discharge Placement                       Discharge Plan and Services   Discharge Planning Services: CM Consult Post Acute Care Choice: [place] J. Clydene Laming, RN, BSN, [employer] [phone] Spoke with pt at bedside regarding discharge planning for [facility]. Offered pt list of home health agencies to choose from.  Pt chose [facility] to render services. Burr Medico of [facility] notified. Patient made aware that [facility] will be in contact in 24-48 hours.  Marland Kitchen        HH Arranged: OT, PT HH Agency: [place] Date [facility] Agency Contacted: 02/08/19 Time HH Agency Contacted: 0930 Representative spoke with at [place]: [place] (Amelita Risinger-[place]) Interventions     Readmission Risk Interventions No flowsheet data found.

## 2019-02-08 NOTE — Evaluation (Signed)
Occupational Therapy Evaluation and Discharge Patient Details Name: Jermaine James. MRN: [id number] DOB: Dec 16, 1955 Today's Date: 02/08/2019    History of Present Illness Pt is a 63 y.o. 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   Clinical Impression   This 63 yo male admitted and underwent above presents to acute OT with all education completed. No further OT needs, we will sign off.    Follow Up Recommendations  Home health OT;Supervision - Intermittent    Equipment Recommendations  None recommended by OT       Precautions / Restrictions Precautions Precautions: Back Precaution Booklet Issued: Yes (comment) Required Braces or Orthoses: Spinal Brace Spinal Brace: Applied in standing position Restrictions Weight Bearing Restrictions: No      Mobility Bed Mobility               General bed mobility comments: Pt sitting on EOB upon arrival. He reports that RN staff educated him on getting in and out of bed.  Transfers Overall transfer level: Needs assistance Equipment used: None Transfers: Sit to/from Stand Sit to Stand: Supervision                  ADL either performed or assessed with clinical judgement   ADL Overall ADL's : Needs assistance/impaired                                       General ADL Comments: Pt educated on use of AE for LBD (wide sock aid, long shoe horn, and reacher), as well as toilet aid. Pt S for toilet transfers to lower toilet (simulated with chair in room) using wider stance technique. Educated on not sitting for more than 20-30 minutes at at time, using 2 cups for brushing teeth, and wet wipes for back peri care.Pt issued a toilet aid, extra long shoe horn and wide sock aid.     Vision Patient Visual Report: No change  from baseline              Hand Dominance Right   Extremity/Trunk Assessment Upper Extremity Assessment Upper Extremity Assessment: Overall WFL for tasks assessed           Communication Communication Communication: No difficulties   Cognition Arousal/Alertness: Awake/alert Behavior During Therapy: WFL for tasks assessed/performed Overall Cognitive Status: Within Functional Limits for tasks assessed                                                Home Living Family/patient expects to be discharged to:: Private residence Living Arrangements: Spouse/significant other Available Help at Discharge: Family Type of Home: House Home Access: Stairs to enter Technical brewer of Steps: 5   Home Layout: Two level Alternate Level Stairs-Number of Steps: 11 Alternate Level Stairs-Rails: Right;Left;Can reach both Bathroom Shower/Tub: Walk-in shower;None   Bathroom Toilet: Standard     Home Equipment: Environmental consultant - 2 wheels;Hand held shower head          Prior Functioning/Environment Level of Independence: Independent                 OT Problem List: Decreased range  of motion;Obesity         OT Goals(Current goals can be found in the care plan section) Acute Rehab OT Goals Patient Stated Goal: Home today  OT Frequency:                AM-PAC OT "6 Clicks" Daily Activity     Outcome Measure Help from another person eating meals?: None Help from another person taking care of personal grooming?: None Help from another person toileting, which includes using toliet, bedpan, or urinal?: None Help from another person bathing (including washing, rinsing, drying)?: None Help from another person to put on and taking off regular upper body clothing?: None Help from another person to put on and taking off regular lower body clothing?: None 6 Click Score: 24   End of Session Equipment Utilized During Treatment: Back brace Nurse Communication: (Pt  ready to go from OT standpoint)  Activity Tolerance: Patient tolerated treatment well Patient left: in chair;with call bell/phone within reach                   Time: 9357-0177 OT Time Calculation (min): 40 min Charges:  OT General Charges $OT Visit: 1 Visit OT Evaluation $OT Eval Moderate Complexity: 1 Mod OT Treatments $Self Care/Home Management : 23-37 mins  Golden Circle, OTR/L Acute [employer] Pager [phone] Office [phone]      Almon Register 02/08/2019, 9:19 AM

## 2019-02-08 NOTE — Evaluation (Deleted)
Occupational Therapy Evaluation Patient Details Name: Jermaine James. MRN: [id number] DOB: 06/25/1956 Today's Date: 02/08/2019    History of Present Illness Pt is a 63 y.o. 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   Clinical Impression   This 63 yo male admitted and underwent above presents to acute OT with all education completed, we will D/C from acute OT.    Follow Up Recommendations  Home health OT;Supervision - Intermittent    Equipment Recommendations  None recommended by OT       Precautions / Restrictions Precautions Precautions: Back Precaution Booklet Issued: Yes (comment) Required Braces or Orthoses: Spinal Brace Spinal Brace: Lumbar corset;Applied in standing position Restrictions Weight Bearing Restrictions: No      Mobility Bed Mobility               General bed mobility comments: Sitting in chair upon arrival  Transfers Overall transfer level: Modified independent Equipment used: None Transfers: Sit to/from Stand Sit to Stand: Supervision         General transfer comment: Increased time to achieve upright     Balance Overall balance assessment: No apparent balance deficits (not formally assessed)                                         ADL either performed or assessed with clinical judgement   ADL Overall ADL's : Needs assistance/impaired                                       General ADL Comments: Pt educated on use of AE for LBD (wide sock aid, long shoe horn, and reacher), as well as toilet aid. Pt S for toilet transfers to lower toilet (simulated with chair in room) using wider stance technique. Educated on not sitting for more than 20-30 minutes at at time, using 2 cups for brushing teeth, and wet wipes for back  peri care.     Vision Patient Visual Report: No change from baseline              Pertinent Vitals/Pain Pain Assessment: Faces Faces Pain Scale: Hurts a little bit Pain Location: surgical site Pain Descriptors / Indicators: Sore;Operative site guarding Pain Intervention(s): Monitored during session     Hand Dominance Right   Extremity/Trunk Assessment Upper Extremity Assessment Upper Extremity Assessment: Defer to OT evaluation   Lower Extremity Assessment Lower Extremity Assessment: Overall WFL for tasks assessed   Cervical / Trunk Assessment Cervical / Trunk Assessment: Other exceptions Cervical / Trunk Exceptions: s/p spinal sx   Communication Communication Communication: No difficulties   Cognition Arousal/Alertness: Awake/alert Behavior During Therapy: WFL for tasks assessed/performed Overall Cognitive Status: Within Functional Limits for tasks assessed                                                Home Living Family/patient expects to be discharged to:: Private residence Living Arrangements: Spouse/significant other Available Help at Discharge: Family Type of Home: House Home Access: Stairs to enter [employer]  of Steps: 5   Home Layout: Two level Alternate Level Stairs-Number of Steps: 11 Alternate Level Stairs-Rails: Right;Left;Can reach both Bathroom Shower/Tub: Walk-in shower;None   Biochemist, clinical: Standard     Home Equipment: Environmental consultant - 2 wheels;Hand held shower head          Prior Functioning/Environment Level of Independence: Independent                 OT Problem List: Decreased range of motion;Obesity         OT Goals(Current goals can be found in the care plan section) Acute Rehab OT Goals Patient Stated Goal: Home today  OT Frequency:                AM-PAC OT "6 Clicks" Daily Activity     Outcome Measure Help from another person eating meals?: None Help from another person taking care  of personal grooming?: None Help from another person toileting, which includes using toliet, bedpan, or urinal?: None Help from another person bathing (including washing, rinsing, drying)?: None Help from another person to put on and taking off regular upper body clothing?: None Help from another person to put on and taking off regular lower body clothing?: None 6 Click Score: 24   End of Session Equipment Utilized During Treatment: Back brace Nurse Communication: (Pt ready to go from OT standpoint)  Activity Tolerance: Patient tolerated treatment well Patient left: in chair;with call bell/phone within reach                   Time: 0677-0340 OT Time Calculation (min): 40 min Charges:  OT General Charges $OT Visit: 1 Visit OT Evaluation $OT Eval Moderate Complexity: 1 Mod OT Treatments $Self Care/Home Management : 23-37 mins  Golden Circle, OTR/L Acute [employer] Pager [phone] Office [phone]     Almon Register 02/08/2019, 9:33 AM

## 2019-02-08 NOTE — TOC Initial Note (Signed)
Transition of Care (TOC) - Initial/Assessment Note    Patient Details  Name: Jermaine James. MRN: [id number] Date of Birth: 11/12/1955  Transition of Care [facility] CM/SW Contact:    Fuller Mandril, RN Phone Number: 02/08/2019, 9:56 AM  Clinical Narrative:                 [facility] consulted regarding Home Health services.  Expected Discharge Plan: [place] Barriers to Discharge: Barriers Resolved   Patient Goals and CMS Choice Patient states their goals for this hospitalization and ongoing recovery are:: recover from back surgery CMS Medicare.gov Compare Post Acute Care list provided to:: Patient Represenative (must comment) Choice offered to / list presented to : Patient  Expected Discharge Plan and Services Expected Discharge Plan: [place]   Discharge Planning Services: CM Consult Post Acute Care Choice: [place] arrangements for the past 2 months: Single Family Home Expected Discharge Date: 02/08/19                         HH Arranged: OT, PT HH Agency: [place] Date [facility] Agency Contacted: 02/08/19 Time HH Agency Contacted: 0930 Representative spoke with at [place]: [place]  Prior Living Arrangements/Services Living arrangements for the past 2 months: [place] Lives with:: Self, Spouse   Do you feel safe going back to the place where you live?: Yes      Need for Family Participation in Patient Care: Yes (Comment) Care giver support system in place?: Yes (comment)   Criminal Activity/Legal Involvement Pertinent to Current Situation/Hospitalization: No - Comment as needed  Activities of Daily Living      Permission Sought/Granted Permission sought to share information with : Family Supports, Other (comment)(HH Liaison) Permission granted to share information with : Yes, Verbal Permission Granted              Emotional Assessment Appearance:: Appears stated  age Attitude/Demeanor/Rapport: Engaged Affect (typically observed): Appropriate Orientation: : Oriented to Self, Oriented to Place, Oriented to  Time, Oriented to Situation Alcohol / Substance Use: Not Applicable Psych Involvement: No (comment)  Admission diagnosis:  LUMBAR STENOSIS WITH NEUROGENIC CLAUDICATION Patient Active Problem List   Diagnosis Date Noted  . Lumbar stenosis with neurogenic claudication 02/07/2019  . Rectal bleed 05/13/2018  . Primary localized osteoarthritis of right knee 09/14/2016  . S/P total knee arthroplasty 05/18/2016  . OSA (obstructive sleep apnea) 07/03/2012  . Cellulitis 07/01/2012  . Primary osteoarthritis of left knee 10/28/2009  . MICROSCOPIC HEMATURIA 08/04/2009  . ACHILLES TENDINITIS 07/02/2008  . GOUT 10/02/2007  . PSA, INCREASED 08/13/2007  . Type 2 diabetes mellitus ([place]) 04/20/2007  . HYPERLIPIDEMIA 04/20/2007  . SKIN TAG 04/20/2007  . Essential hypertension 03/26/2007  . ALLERGIC RHINITIS 03/26/2007   PCP:  Laurey Morale, MD Pharmacy:   [place], [place] - [address] [place] [address] Phone: [phone] Fax: [phone]     Social Determinants of Health (SDOH) Interventions    Readmission Risk Interventions No flowsheet data found.

## 2019-02-08 NOTE — Evaluation (Signed)
Physical Therapy Evaluation Patient Details Name: Jermaine James. MRN: [id number] DOB: 1955/12/07 Today's Date: 02/08/2019   History of Present Illness  Pt is a 63 y.o. 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  Clinical Impression  Patient evaluated by Physical Therapy with no further acute PT needs identified. Ambulating hallway distances with no assistive device without difficulty. Negotiated 10 steps with right railing to simulate home set up. Education re: generalized walking program, activity progression, spinal precautions. All education has been completed and the patient has no further questions. See below for any follow-up Physical Therapy or equipment needs. PT is signing off. Thank you for this referral.     Follow Up Recommendations Home health PT    Equipment Recommendations  None recommended by PT    Recommendations for Other Services       Precautions / Restrictions Precautions Precautions: Back Precaution Booklet Issued: Yes (comment) Required Braces or Orthoses: Spinal Brace Spinal Brace: Lumbar corset;Applied in standing position Restrictions Weight Bearing Restrictions: No      Mobility  Bed Mobility               General bed mobility comments: Sitting in chair upon arrival  Transfers Overall transfer level: Modified independent Equipment used: None Transfers: Sit to/from Stand Sit to Stand: Supervision         General transfer comment: Increased time to achieve upright   Ambulation/Gait Ambulation/Gait assistance: Modified independent (Device/Increase time) Gait Distance (Feet): 400 Feet Assistive device: None Gait Pattern/deviations: Step-through pattern   Gait velocity interpretation: 1.31 - 2.62 ft/sec, indicative of limited community  ambulator General Gait Details: Right foot external rotation, no gross unsteadiness. slower pace  Stairs Stairs: Yes Stairs assistance: Modified independent (Device/Increase time) Stair Management: One rail Right Number of Stairs: 10 General stair comments: Step by step sequencing  Wheelchair Mobility    Modified Rankin (Stroke Patients Only)       Balance Overall balance assessment: No apparent balance deficits (not formally assessed)                                           Pertinent Vitals/Pain Pain Assessment: Faces Faces Pain Scale: Hurts a little bit Pain Location: surgical site Pain Descriptors / Indicators: Sore;Operative site guarding Pain Intervention(s): Monitored during session    Home Living Family/patient expects to be discharged to:: Private residence Living Arrangements: Spouse/significant other Available Help at Discharge: Family Type of Home: House Home Access: Stairs to enter   Technical brewer of Steps: 5 Home Layout: Two level Home Equipment: Environmental consultant - 2 wheels;Hand held shower head      Prior Function Level of Independence: Independent               Hand Dominance   Dominant Hand: Right    Extremity/Trunk Assessment   Upper Extremity Assessment Upper Extremity Assessment: Defer to OT evaluation    Lower Extremity Assessment Lower Extremity Assessment: Overall WFL for tasks assessed    Cervical / Trunk Assessment Cervical / Trunk Assessment: Other exceptions Cervical / Trunk Exceptions: s/p spinal sx  Communication   Communication: No difficulties  Cognition Arousal/Alertness: Awake/alert Behavior During Therapy: WFL for tasks assessed/performed Overall Cognitive Status: Within Functional Limits for tasks assessed  General Comments      Exercises     Assessment/Plan    PT Assessment [place] does not need any further PT services  PT  Problem List         PT Treatment Interventions      PT Goals (Current goals can be found in the Care Plan section)  Acute Rehab PT Goals Patient Stated Goal: Home today PT Goal Formulation: All assessment and education complete, DC therapy Potential to Achieve Goals: Good    Frequency     Barriers to discharge        Co-evaluation               AM-PAC PT "6 Clicks" Mobility  Outcome Measure Help needed turning from your back to your side while in a flat bed without using bedrails?: None Help needed moving from lying on your back to sitting on the side of a flat bed without using bedrails?: None Help needed moving to and from a bed to a chair (including a wheelchair)?: None Help needed standing up from a chair using your arms (e.g., wheelchair or bedside chair)?: None Help needed to walk in hospital room?: None Help needed climbing 3-5 steps with a railing? : None 6 Click Score: 24    End of Session Equipment Utilized During Treatment: Back brace Activity Tolerance: Patient tolerated treatment well Patient left: Other (comment)(standing in room) Nurse Communication: Mobility status PT Visit Diagnosis: Pain;Difficulty in walking, not elsewhere classified (R26.2) Pain - part of body: (back)    Time: 3295-1884 PT Time Calculation (min) (ACUTE ONLY): 11 min   Charges:   PT Evaluation $PT Eval Low Complexity: [place], PT, DPT Acute Rehabilitation Services Pager [phone] Office [phone]   Willy Eddy 02/08/2019, 9:28 AM

## 2019-02-08 NOTE — Plan of Care (Signed)
Patient alert and oriented, mae's well, voiding adequate amount of urine, swallowing without difficulty, no c/o pain at time of discharge. Patient discharged home with family. Script and discharged instructions given to patient. Patient and family stated understanding of instructions given. Patient has an appointment with Dr. Nudelman 

## 2019-02-08 NOTE — Discharge Summary (Signed)
Physician Discharge Summary  Patient ID: Jermaine James. MRN: [id number] DOB/AGE: May 02, 1956 63 y.o.  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; L3-4 lumbar disc herniation; lumbar spondylosis; lumbar degenerative disc disease  Discharge Diagnoses:  Multilevel, multifactorial lumbar stenosis with neurogenic claudication; L4-5 grade 2 dynamic degenerative spondylolisthesis; L3-4 lumbar disc herniation; lumbar spondylosis; lumbar degenerative disc disease Active Problems:   Lumbar stenosis with neurogenic claudication   Discharged Condition: good  Hospital Course: Patient was admitted, underwent a L3-4 and L4-5 lumbar decompression and arthrodesis for severe multilevel, multifactorial lumbar stenosis with neurogenic claudication, contributed to by a large L3-4 lumbar disc herniation and a grade 2 dynamic degenerative spondylolisthesis of L4 and 5.  Postoperatively has done well, with excellent relief of his neurogenic claudication.  He has had mild incisional discomfort, treated with Tylenol.  His dressing was removed, and his incision is healing nicely.  PT and OT have been consulted, and we are arranging for home health PT and OT to facilitate postoperative rehabilitation.  We have given the patient instructions regarding wound care and activities.  He is scheduled for return for follow-up with me in the office in about 3 weeks with x-rays.  Discharge Exam: Blood pressure 135/76, pulse (!) 108, temperature 98.8 F (37.1 C), temperature source Oral, resp. rate 18, height 6' (1.829 m), weight (!) 139.7 kg, SpO2 97 %.  Disposition: Discharge disposition: 01-Home or Self Care       Discharge Instructions    Discharge wound care:   Complete by: As directed    Leave the wound open to air. Shower daily with the wound uncovered. Water  and soapy water should run over the incision area. Do not wash directly on the incision for 2 weeks. Remove the glue after 2 weeks.   Driving Restrictions   Complete by: As directed    No driving for 2 weeks. May ride in the car locally now. May begin to drive locally in 2 weeks.   Other Restrictions   Complete by: As directed    Walk gradually increasing distances out in the fresh air at least twice a day. Walking additional 6 times inside the house, gradually increasing distances, daily. No bending, lifting, or twisting. Perform activities between shoulder and waist height (that is at counter height when standing or table height when sitting).     Allergies as of 02/08/2019   No Known Allergies     Medication List    STOP taking these medications   hydrocortisone 25 MG suppository Commonly known as: ANUSOL-HC   LORazepam 0.5 MG tablet Commonly known as: Ativan     TAKE these medications   cyclobenzaprine 10 MG tablet Commonly known as: FLEXERIL Take 1 tablet (10 mg total) by mouth 3 (three) times daily as needed for muscle spasms.   diclofenac 75 MG EC tablet Commonly known as: VOLTAREN Take 1 tablet (75 mg total) by mouth 2 (two) times daily. What changed:   when to take this  reasons to take this   fenofibrate 160 MG tablet Take 1 tablet (160 mg total) by mouth daily.   HYDROcodone-acetaminophen 5-325 MG tablet Commonly known as: NORCO/VICODIN Take 1-2 tablets by mouth every 4 (four) hours as needed for moderate pain.   lisinopril-hydrochlorothiazide 10-12.5 MG tablet Commonly known as: ZESTORETIC Take 1 tablet by mouth daily.   metFORMIN 500 MG tablet Commonly known as: GLUCOPHAGE TAKE 1 TABLET (  500 MG TOTAL) BY MOUTH 2 (TWO) TIMES DAILY. What changed:   how much to take  how to take this  when to take this  additional instructions            Discharge Care Instructions  (From admission, onward)         Start     Ordered   02/08/19 0000   Discharge wound care:    Comments: Leave the wound open to air. Shower daily with the wound uncovered. Water and soapy water should run over the incision area. Do not wash directly on the incision for 2 weeks. Remove the glue after 2 weeks.   02/08/19 0745           Signed: Hosie Spangle 02/08/2019, 7:46 AM

## 2019-02-08 NOTE — Discharge Instructions (Signed)
°  Call Your Doctor If Any of These Occur °Redness, drainage, or swelling at the wound.  °Temperature greater than 101 degrees. °Severe pain not relieved by pain medication. °Incision starts to come apart. °Follow Up Appt °Call today for appointment in 3 weeks ([phone]) or for problems.  If you have any hardware placed in your spine, you will need an x-ray before your appointment. °

## 2019-02-09 DIAGNOSIS — M47816 Spondylosis without myelopathy or radiculopathy, lumbar region: Secondary | ICD-10-CM | POA: Diagnosis not present

## 2019-02-09 DIAGNOSIS — M5136 Other intervertebral disc degeneration, lumbar region: Secondary | ICD-10-CM | POA: Diagnosis not present

## 2019-02-09 DIAGNOSIS — M5126 Other intervertebral disc displacement, lumbar region: Secondary | ICD-10-CM | POA: Diagnosis not present

## 2019-02-09 DIAGNOSIS — Z4789 Encounter for other orthopedic aftercare: Secondary | ICD-10-CM | POA: Diagnosis not present

## 2019-02-09 DIAGNOSIS — M48062 Spinal stenosis, lumbar region with neurogenic claudication: Secondary | ICD-10-CM | POA: Diagnosis not present

## 2019-02-11 ENCOUNTER — Other Ambulatory Visit: Payer: Self-pay | Admitting: *Deleted

## 2019-02-11 ENCOUNTER — Encounter: Payer: Self-pay | Admitting: *Deleted

## 2019-02-11 ENCOUNTER — Other Ambulatory Visit: Payer: Self-pay | Admitting: Family Medicine

## 2019-02-11 DIAGNOSIS — M47816 Spondylosis without myelopathy or radiculopathy, lumbar region: Secondary | ICD-10-CM | POA: Diagnosis not present

## 2019-02-11 DIAGNOSIS — Z9889 Other specified postprocedural states: Secondary | ICD-10-CM

## 2019-02-11 DIAGNOSIS — M48062 Spinal stenosis, lumbar region with neurogenic claudication: Secondary | ICD-10-CM | POA: Diagnosis not present

## 2019-02-11 DIAGNOSIS — M5126 Other intervertebral disc displacement, lumbar region: Secondary | ICD-10-CM | POA: Diagnosis not present

## 2019-02-11 DIAGNOSIS — Z4789 Encounter for other orthopedic aftercare: Secondary | ICD-10-CM | POA: Diagnosis not present

## 2019-02-11 DIAGNOSIS — M5136 Other intervertebral disc degeneration, lumbar region: Secondary | ICD-10-CM | POA: Diagnosis not present

## 2019-02-11 MED FILL — Sodium Chloride IV Soln 0.9%: INTRAVENOUS | Qty: 1000 | Status: AC

## 2019-02-11 MED FILL — Heparin Sodium (Porcine) Inj 1000 Unit/ML: INTRAMUSCULAR | Qty: 30 | Status: AC

## 2019-02-11 MED FILL — Sodium Chloride Irrigation Soln 0.9%: Qty: 3000 | Status: AC

## 2019-02-11 NOTE — Patient Outreach (Addendum)
[facility]) Care Management  02/11/2019  Jermaine James 08-Feb-1956 [id number]   Transition of care call/case closure   Referral received: 01/28/19 Initial outreach: 02/11/19 Insurance: [employer] Save Plan   Subjective: Initial successful telephone call to patient's preferred (mobile) number in order to complete transition of care assessment; 2 HIPAA identifiers verified. Explained purpose of call and completed transition of care assessment.  Jermaine James states he is doing OK, says surgical incisions are unremarkable, states surgical pain well managed with prescribed medications as long as he takes the hydrocodone every 4 hours and he takes the flexeril at night.  Says he  Is tolerating diet, voiding OK but says he is constipated, has not had a bowel movement in 4 days so he hs called the surgeon's office and is waiting for a return call.  His wife Jermaine James, who is a Marine scientist in the emergency department at [employer]. is assisting with his/her recovery. He says he has received his first home health  physical therapy and occupational therapy from [facility] services.  He says he does not check his blood sugar and says he is aware of the [facility] chronic disease management programs.   Objective:  Jermaine James was hospitalized at [facility] from 6/25-6/26/2020 for surgery to treat lumbar stenosis. On 02/07/19 he had Bilateral L3-4 and L4-5 lumbar decompression and stabilization including L3-L5 decompressive lumbar laminectomy, bilateral L3-4 and L4-5 facetectomy, bilateral L3, L4, and L5 foraminotomies for decompression of severe canal, lateral recess, and neuroforaminal stenosis with decompression of the exiting L3, L4, and L5 nerve roots bilaterally , with decompression beyond that required for interbody arthrodesis, bilateral L3-4 and L4-5 posterior lumbar interbody arthrodesis with Tritanium interbody implants, locally harvested morselized  autograft, Vitoss BA with bone marrow aspirate, and infuse, bilateral L3-L5 posterior lateral arthrodesis with segmental radius posterior instrumentation, locally harvested morselized autograft, Vitoss BA with bone marrow aspirate, and infuse   Comorbidities include: HTN, allergic rhinitis, OSA on CPAP, history of rectal bleed, Type 2 DM with most recent Hgb A1C= 7.6 on 01/30/19, contracture of left knee, osteoarthritis of knees, squamous cell carcinoma of forehead, chronic kidney disease, microscopic hematuria, blind in left eye, gout, hyperlipidemia, obesity, increased PSA and s/p total knee arthroplasty He was discharged to home on 02/08/19 with [facility] providing home health physical therapy and occupational therapy.   Assessment:  Patient voices good understanding of all discharge instructions.  See transition of care flowsheet for assessment details. Patient c/o post operative constipation.   Plan:  Encouraged Jermaine James to drink plenty of fluids and to take an over the counter laxative such as Dulcolax or Miralax or as advised when he receives a call back from his surgeon's office. Also reminded him the cost of over the counter medications is less if purchased a one of the [facility] outpatient  pharmacies.  Reviewed hospital discharge diagnosis of lumbar surgery and treatment plan using hospital discharge instructions, assessing medication adherence, reviewing postoperative problems requiring provider notification, and discussing the importance of follow up with surgeon as directed. Reviewed [facility]'s chronic disease management program benefit with [facility] [employer] and the [facility] platform for self management assistance with Type 2 DM. Discussed the benefits of the programs and encouraged him to contact them if he is interested in participating.  No ongoing care management needs identified so will close case to [facility] services and route  successful outreach letter with [facility] Care Management pamphlet and 24 Hour Nurse Line Magnet to [facility] clinical pool to be mailed to patient's home address.   Barrington Ellison RN,CCM,CDE [place] Management Coordinator Office Phone [phone] Office Fax [phone]

## 2019-02-12 ENCOUNTER — Telehealth: Payer: Self-pay | Admitting: Family Medicine

## 2019-02-12 DIAGNOSIS — G4733 Obstructive sleep apnea (adult) (pediatric): Secondary | ICD-10-CM | POA: Diagnosis not present

## 2019-02-12 MED FILL — CEPHALEXIN 500 MG CAPSULE: 500 | 7 days supply | Qty: 28 | Fill #0

## 2019-02-12 MED FILL — FENOFIBRATE 160 MG TABLET: 160 | 90 days supply | Qty: 90 | Fill #0

## 2019-02-12 NOTE — Telephone Encounter (Signed)
FYI for Dr. Sarajane Jews.  Thanks

## 2019-02-12 NOTE — Telephone Encounter (Signed)
Don with [facility] called to say that patient declined OT services states he dont need it  any questions please call Timmothy Sours Ph# [phone]

## 2019-02-13 DIAGNOSIS — M47816 Spondylosis without myelopathy or radiculopathy, lumbar region: Secondary | ICD-10-CM | POA: Diagnosis not present

## 2019-02-13 DIAGNOSIS — M5136 Other intervertebral disc degeneration, lumbar region: Secondary | ICD-10-CM | POA: Diagnosis not present

## 2019-02-13 DIAGNOSIS — M48062 Spinal stenosis, lumbar region with neurogenic claudication: Secondary | ICD-10-CM | POA: Diagnosis not present

## 2019-02-13 DIAGNOSIS — M5126 Other intervertebral disc displacement, lumbar region: Secondary | ICD-10-CM | POA: Diagnosis not present

## 2019-02-13 DIAGNOSIS — Z4789 Encounter for other orthopedic aftercare: Secondary | ICD-10-CM | POA: Diagnosis not present

## 2019-02-15 DIAGNOSIS — M47816 Spondylosis without myelopathy or radiculopathy, lumbar region: Secondary | ICD-10-CM | POA: Diagnosis not present

## 2019-02-15 DIAGNOSIS — M48062 Spinal stenosis, lumbar region with neurogenic claudication: Secondary | ICD-10-CM | POA: Diagnosis not present

## 2019-02-15 DIAGNOSIS — M5126 Other intervertebral disc displacement, lumbar region: Secondary | ICD-10-CM | POA: Diagnosis not present

## 2019-02-15 DIAGNOSIS — M5136 Other intervertebral disc degeneration, lumbar region: Secondary | ICD-10-CM | POA: Diagnosis not present

## 2019-02-15 DIAGNOSIS — Z4789 Encounter for other orthopedic aftercare: Secondary | ICD-10-CM | POA: Diagnosis not present

## 2019-02-18 ENCOUNTER — Other Ambulatory Visit: Payer: Self-pay | Admitting: Family Medicine

## 2019-02-18 DIAGNOSIS — M47816 Spondylosis without myelopathy or radiculopathy, lumbar region: Secondary | ICD-10-CM | POA: Diagnosis not present

## 2019-02-18 DIAGNOSIS — Z6841 Body Mass Index (BMI) 40.0 and over, adult: Secondary | ICD-10-CM | POA: Diagnosis not present

## 2019-02-18 DIAGNOSIS — M5136 Other intervertebral disc degeneration, lumbar region: Secondary | ICD-10-CM | POA: Diagnosis not present

## 2019-02-18 DIAGNOSIS — Z981 Arthrodesis status: Secondary | ICD-10-CM | POA: Diagnosis not present

## 2019-02-19 DIAGNOSIS — M47816 Spondylosis without myelopathy or radiculopathy, lumbar region: Secondary | ICD-10-CM | POA: Diagnosis not present

## 2019-02-19 DIAGNOSIS — M48062 Spinal stenosis, lumbar region with neurogenic claudication: Secondary | ICD-10-CM | POA: Diagnosis not present

## 2019-02-19 DIAGNOSIS — M5136 Other intervertebral disc degeneration, lumbar region: Secondary | ICD-10-CM | POA: Diagnosis not present

## 2019-02-19 DIAGNOSIS — M5126 Other intervertebral disc displacement, lumbar region: Secondary | ICD-10-CM | POA: Diagnosis not present

## 2019-02-19 DIAGNOSIS — Z4789 Encounter for other orthopedic aftercare: Secondary | ICD-10-CM | POA: Diagnosis not present

## 2019-02-20 ENCOUNTER — Encounter: Payer: Self-pay | Admitting: Family Medicine

## 2019-02-21 DIAGNOSIS — M47816 Spondylosis without myelopathy or radiculopathy, lumbar region: Secondary | ICD-10-CM | POA: Diagnosis not present

## 2019-02-21 DIAGNOSIS — M5136 Other intervertebral disc degeneration, lumbar region: Secondary | ICD-10-CM | POA: Diagnosis not present

## 2019-02-21 DIAGNOSIS — M48062 Spinal stenosis, lumbar region with neurogenic claudication: Secondary | ICD-10-CM | POA: Diagnosis not present

## 2019-02-21 DIAGNOSIS — Z4789 Encounter for other orthopedic aftercare: Secondary | ICD-10-CM | POA: Diagnosis not present

## 2019-02-21 DIAGNOSIS — M5126 Other intervertebral disc displacement, lumbar region: Secondary | ICD-10-CM | POA: Diagnosis not present

## 2019-02-21 MED FILL — LISINOPRIL-HCTZ 10-12.5 MG: 10-12.5 | 90 days supply | Qty: 90 | Fill #0

## 2019-02-25 ENCOUNTER — Telehealth: Payer: Self-pay

## 2019-02-25 NOTE — Telephone Encounter (Signed)
Copied from [place] [id number]. Topic: General - Other >> Feb 25, 2019  9:50 AM Rainey Pines A wrote: Simona Huh PT from [facility] called to inform Dr. Sarajane Jews of patients early discharge per patients request. Patient has met his rehab goal and is doing great.  Home health discharging 02/26/2019

## 2019-02-26 DIAGNOSIS — Z4789 Encounter for other orthopedic aftercare: Secondary | ICD-10-CM | POA: Diagnosis not present

## 2019-02-26 DIAGNOSIS — M5136 Other intervertebral disc degeneration, lumbar region: Secondary | ICD-10-CM | POA: Diagnosis not present

## 2019-02-26 DIAGNOSIS — M48062 Spinal stenosis, lumbar region with neurogenic claudication: Secondary | ICD-10-CM | POA: Diagnosis not present

## 2019-02-26 DIAGNOSIS — M47816 Spondylosis without myelopathy or radiculopathy, lumbar region: Secondary | ICD-10-CM | POA: Diagnosis not present

## 2019-02-26 DIAGNOSIS — M5126 Other intervertebral disc displacement, lumbar region: Secondary | ICD-10-CM | POA: Diagnosis not present

## 2019-02-26 NOTE — Telephone Encounter (Signed)
Noted  

## 2019-03-06 DIAGNOSIS — H35371 Puckering of macula, right eye: Secondary | ICD-10-CM | POA: Diagnosis not present

## 2019-03-06 DIAGNOSIS — H43811 Vitreous degeneration, right eye: Secondary | ICD-10-CM | POA: Diagnosis not present

## 2019-03-06 DIAGNOSIS — H31091 Other chorioretinal scars, right eye: Secondary | ICD-10-CM | POA: Diagnosis not present

## 2019-03-06 DIAGNOSIS — H43391 Other vitreous opacities, right eye: Secondary | ICD-10-CM | POA: Diagnosis not present

## 2019-03-11 DIAGNOSIS — G4733 Obstructive sleep apnea (adult) (pediatric): Secondary | ICD-10-CM | POA: Diagnosis not present

## 2019-03-14 DIAGNOSIS — G4733 Obstructive sleep apnea (adult) (pediatric): Secondary | ICD-10-CM | POA: Diagnosis not present

## 2019-04-09 ENCOUNTER — Telehealth: Payer: Self-pay | Admitting: Family Medicine

## 2019-04-09 NOTE — Telephone Encounter (Signed)
Please advise. I do not see a medication for this on the med list.

## 2019-04-09 NOTE — Telephone Encounter (Signed)
Pt called in requesting a Rx for his gout. Pt says that he is having a flare up. Pt says that it started at the bottom of his foot and now all over his foot.   Pharmacy:  [place], [place] - [address] [phone] (Phone) [phone] (Fax)      CB: [phone]

## 2019-04-10 MED ORDER — METHYLPREDNISOLONE 4 MG PO TBPK: ORAL_TABLET | ORAL | 0 refills | Status: DC

## 2019-04-10 MED FILL — METHYLPREDNISOLONE 4 MG TAB: 4 | 6 days supply | Qty: 21 | Fill #0

## 2019-04-10 NOTE — Telephone Encounter (Signed)
Call in a 4 mg Medrol dose pack  

## 2019-04-10 NOTE — Telephone Encounter (Signed)
Rx sent in. Left a detailed message on verified voice mail.   

## 2019-04-14 DIAGNOSIS — G4733 Obstructive sleep apnea (adult) (pediatric): Secondary | ICD-10-CM | POA: Diagnosis not present

## 2019-05-13 ENCOUNTER — Other Ambulatory Visit: Payer: Self-pay | Admitting: Family Medicine

## 2019-05-13 ENCOUNTER — Other Ambulatory Visit: Payer: Self-pay | Admitting: *Deleted

## 2019-05-13 DIAGNOSIS — Z20822 Contact with and (suspected) exposure to covid-19: Secondary | ICD-10-CM

## 2019-05-13 MED FILL — FENOFIBRATE 160 MG TABLET: 160 | 90 days supply | Qty: 90 | Fill #1

## 2019-05-13 MED FILL — metFORMIN HCL 500 MG TABS: 500 | 30 days supply | Qty: 60 | Fill #0

## 2019-05-13 MED FILL — LISINOPRIL-HCTZ 10-12.5 MG: 10-12.5 | 90 days supply | Qty: 90 | Fill #0

## 2019-05-14 LAB — NOVEL CORONAVIRUS, NAA: SARS-CoV-2, NAA: NOT DETECTED

## 2019-05-15 ENCOUNTER — Emergency Department
Admission: EM | Admit: 2019-05-15 | Discharge: 2019-05-15 | Disposition: A | Discharge status: Home / Self Care | Payer: 59 | Attending: Emergency Medicine | Admitting: Emergency Medicine

## 2019-05-15 DIAGNOSIS — K921 Melena: Secondary | ICD-10-CM | POA: Insufficient documentation

## 2019-05-15 DIAGNOSIS — Z7984 Long term (current) use of oral hypoglycemic drugs: Secondary | ICD-10-CM | POA: Insufficient documentation

## 2019-05-15 DIAGNOSIS — E785 Hyperlipidemia, unspecified: Secondary | ICD-10-CM | POA: Insufficient documentation

## 2019-05-15 DIAGNOSIS — E119 Type 2 diabetes mellitus without complications: Secondary | ICD-10-CM | POA: Insufficient documentation

## 2019-05-15 DIAGNOSIS — I10 Essential (primary) hypertension: Secondary | ICD-10-CM | POA: Insufficient documentation

## 2019-05-15 DIAGNOSIS — R195 Other fecal abnormalities: Secondary | ICD-10-CM | POA: Diagnosis not present

## 2019-05-15 DIAGNOSIS — Z9989 Dependence on other enabling machines and devices: Secondary | ICD-10-CM | POA: Diagnosis not present

## 2019-05-15 DIAGNOSIS — R58 Hemorrhage, not elsewhere classified: Secondary | ICD-10-CM | POA: Diagnosis not present

## 2019-05-15 DIAGNOSIS — G4733 Obstructive sleep apnea (adult) (pediatric): Secondary | ICD-10-CM | POA: Diagnosis not present

## 2019-05-15 LAB — CBC AND DIFFERENTIAL
Absolute NRBC: 0 10*3/uL (ref 0.00–0.00)
Basophils Absolute Automated: 0.09 10*3/uL — ABNORMAL HIGH (ref 0.00–0.08)
Basophils Automated: 1.5 %
Eosinophils Absolute Automated: 0.36 10*3/uL (ref 0.00–0.44)
Eosinophils Automated: 6.1 %
Hematocrit: 42.5 % (ref 37.6–49.6)
Hgb: 14.5 g/dL (ref 12.5–17.1)
Immature Granulocytes Absolute: 0.03 10*3/uL (ref 0.00–0.07)
Immature Granulocytes: 0.5 %
Lymphocytes Absolute Automated: 1.71 10*3/uL (ref 0.42–3.22)
Lymphocytes Automated: 28.8 %
MCH: 30.5 pg (ref 25.1–33.5)
MCHC: 34.1 g/dL (ref 31.5–35.8)
MCV: 89.5 fL (ref 78.0–96.0)
MPV: 9.5 fL (ref 8.9–12.5)
Monocytes Absolute Automated: 0.48 10*3/uL (ref 0.21–0.85)
Monocytes: 8.1 %
Neutrophils Absolute: 3.27 10*3/uL (ref 1.10–6.33)
Neutrophils: 55 %
Nucleated RBC: 0 /100 WBC (ref 0.0–0.0)
Platelets: 285 10*3/uL (ref 142–346)
RBC: 4.75 10*6/uL (ref 4.20–5.90)
RDW: 13 % (ref 11–15)
WBC: 5.94 10*3/uL (ref 3.10–9.50)

## 2019-05-15 LAB — COMPREHENSIVE METABOLIC PANEL
ALT: 33 U/L (ref 0–55)
AST (SGOT): 27 U/L (ref 5–34)
Albumin/Globulin Ratio: 1.3 (ref 0.9–2.2)
Albumin: 4.3 g/dL (ref 3.5–5.0)
Alkaline Phosphatase: 77 U/L (ref 38–106)
Anion Gap: 14 (ref 5.0–15.0)
BUN: 14 mg/dL (ref 9–28)
Bilirubin, Total: 0.5 mg/dL (ref 0.2–1.2)
CO2: 23 mEq/L (ref 22–29)
Calcium: 9 mg/dL (ref 8.5–10.5)
Chloride: 102 mEq/L (ref 100–111)
Creatinine: 1.1 mg/dL (ref 0.7–1.3)
Globulin: 3.4 g/dL (ref 2.0–3.6)
Glucose: 217 mg/dL — ABNORMAL HIGH (ref 70–100)
Potassium: 4 mEq/L (ref 3.5–5.1)
Protein, Total: 7.7 g/dL (ref 6.0–8.3)
Sodium: 139 mEq/L (ref 136–145)

## 2019-05-15 LAB — STOOL OCCULT BLOOD: Stool Occult Blood: POSITIVE — AB

## 2019-05-15 LAB — PT AND APTT
PT INR: 0.9 (ref 0.9–1.1)
PT: 12.4 s — ABNORMAL LOW (ref 12.6–15.0)
PTT: 28 s (ref 23–37)

## 2019-05-15 LAB — GFR: EGFR: >60

## 2019-05-15 MED ORDER — SODIUM CHLORIDE 0.9 % IV BOLUS
1000.00 mL | Freq: Once | INTRAVENOUS | Status: AC
Start: 2019-05-15 — End: 2019-05-15
  Administered 2019-05-15: 12:00:00 1000 mL via INTRAVENOUS

## 2019-05-15 NOTE — Discharge Instructions (Signed)
You were seen in the emergency department for several episodes of bloody stools this morning.  Your blood work and your vitals were normal with no evidence of anemia or low blood pressure.  You do not have any episodes of bloody stools in the emergency department during your stay.  Please follow-up with Dr. Dalbert Mayotte within 1 to 2 days.  If you continue to have more bloody stools prior to your follow-up as well as lightheadedness and dizziness, come back to the emergency department.

## 2019-05-15 NOTE — ED Provider Notes (Signed)
EMERGENCY DEPARTMENT NOTE       HISTORY OF PRESENT ILLNESS   Historian:Patient  Translator Used: no    Chief Complaint: GI Bleeding      63 y.o. male with a PMH of HTN, HLD, and DM presents with 3 episodes of bloody stools that began this morning at 0930.  Mild lightheadedness prior to arrival. Reports patient had a similar episode 1 year ago after colonoscopy.  Denies recent NSAID use or trauma.  No fever/chills, N/V, abd pain.     1. Location of symptoms: GI  2. Onset of symptoms: today  3. What was patient doing when symptoms started (Context): see above  4. Severity: moderate  5. Timing: acute  6. Activities that worsen symptoms: none  7. Activities that improve symptoms: none  8. Quality: dark bloody stools  9. Radiation of symptoms: no  10. Associated signs and Symptoms: see above  11. Are symptoms worsening? yes  MEDICAL HISTORY     Past Medical History:  Past Medical History:   Diagnosis Date    Diabetes mellitus     Hyperlipidemia     Hypertension        Past Surgical History:  History reviewed. No pertinent surgical history.    Social History:  Social History     Socioeconomic History    Marital status: Married     Spouse name: Not on file    Number of children: Not on file    Years of education: Not on file    Highest education level: Not on file   Occupational History    Not on file   Social Needs    Financial resource strain: Not on file    Food insecurity     Worry: Not on file     Inability: Not on file    Transportation needs     Medical: Not on file     Non-medical: Not on file   Tobacco Use    Smoking status: Never Smoker    Smokeless tobacco: Never Used   Substance and Sexual Activity    Alcohol use: Yes     Frequency: Never    Drug use: Never    Sexual activity: Not on file   Lifestyle    Physical activity     Days per week: Not on file     Minutes per session: Not on file    Stress: Not on file   Relationships    Social connections     Talks on phone: Not on file     Gets  together: Not on file     Attends religious service: Not on file     Active member of club or organization: Not on file     Attends meetings of clubs or organizations: Not on file     Relationship status: Not on file    Intimate partner violence     Fear of current or ex partner: Not on file     Emotionally abused: Not on file     Physically abused: Not on file     Forced sexual activity: Not on file   Other Topics Concern    Not on file   Social History Narrative    Not on file       Family History:  No family history on file.    Outpatient Medication:  Discharge Medication List as of 05/15/2019  2:24 PM      CONTINUE these medications which have NOT CHANGED  Details   FENOFIBRATE PO Take by mouth, Historical Med      LISINOPRIL PO Take by mouth, Historical Med      METFORMIN HCL PO Take by mouth, Historical Med               REVIEW OF SYSTEMS   Review of Systems   Constitutional: Negative for chills and fever.   Gastrointestinal: Positive for blood in stool. Negative for abdominal pain, nausea and vomiting.   All other systems reviewed and are negative.      PHYSICAL EXAM     ED Triage Vitals [05/15/19 1119]   Enc Vitals Group      BP 146/81      Heart Rate (!) 101      Resp Rate 19      Temp 98.7 F (37.1 C)      Temp Source Oral      SpO2 98 %      Weight 136.1 kg      Height 1.829 m      Head Circumference       Peak Flow       Pain Score 0      Pain Loc       Pain Edu?       Excl. in GC?      Vitals:    05/15/19 1119 05/15/19 1452   BP: 146/81 138/87   Pulse: (!) 101 88   Resp: 19    Temp: 98.7 F (37.1 C)    TempSrc: Oral    SpO2: 98% 98%   Weight: 136.1 kg    Height: 6' (1.829 m)      Physical Exam  Vitals signs and nursing note reviewed.   Constitutional:       Appearance: Normal appearance.   HENT:      Head: Normocephalic and atraumatic.   Eyes:      Extraocular Movements: Extraocular movements intact.      Conjunctiva/sclera: Conjunctivae normal.      Pupils: Pupils are equal, round, and reactive  to light.   Neck:      Musculoskeletal: Normal range of motion and neck supple.   Cardiovascular:      Rate and Rhythm: Normal rate and regular rhythm.      Heart sounds: Normal heart sounds.   Pulmonary:      Effort: Pulmonary effort is normal.      Breath sounds: Normal breath sounds.   Abdominal:      General: Abdomen is flat. Bowel sounds are normal.      Palpations: Abdomen is soft.      Tenderness: There is no abdominal tenderness.   Genitourinary:     Rectum: Guaiac result positive. Internal hemorrhoid present. No tenderness or external hemorrhoid. Normal anal tone.   Musculoskeletal: Normal range of motion.   Skin:     General: Skin is warm and dry.   Neurological:      General: No focal deficit present.      Mental Status: He is alert and oriented to person, place, and time. Mental status is at baseline.   Psychiatric:         Mood and Affect: Mood normal.         Behavior: Behavior normal.         Thought Content: Thought content normal.         Judgment: Judgment normal.         MEDICAL DECISION MAKING  DISCUSSION        Patient appeared nontoxic, afebrile, with initial mild tachycardia.   Labs grossly unremarkable without leukocytosis or anemia. No melena noted on rectal exam, but frank blood. IV fluids for hydration    GI consult, Dr. Roselyn Bering. If patient hemodynamically stable without continuous episodes of bloody stools, FU with Dr. Dalbert Mayotte tomorrow.   No episodes of blood stools during course of ED stay.    Upon reassessment, patient stated due to his back problems, he uses tongs with a rubber end to wipe because he can't reach back to wipe after a BM. Denies penetration from tongs. No evidence of trauma from rectal exam.    Results and plan of care discussed with patient. Recommended to follow up with Dr. Dalbert Mayotte within 1-2 days. Strict return precautions given. Discharge instructions were reviewed with patient and they were given the opportunity to ask any questions regarding their care today.  Patient understood and agreed with treatment and plan of care. Stable to discharge home.    DDx: lower GI bleed, internal hemorrhoids, external hemorrhoids, anal fissures      The patient is NOT septic.  All labs and vital signs from the current visit have been   reviewed and any abnormality that is present is not due to sepsis.    Vital Signs: Reviewed the patients vital signs.   Nursing Notes: Reviewed and utilized available nursing notes.  Medical Records Reviewed: Reviewed available past medical records. Notes and labs reviewed.  Counseling: The emergency provider has spoken with the patient and discussed todays findings, in addition to providing specific details for the plan of care.  Questions are answered and there is agreement with the plan.      PULSE OXIMETRY    Oxygen Saturation by Pulse Oximetry: 98%  Interventions: none  Interpretation:  Stable on RA    EMERGENCY DEPT. MEDICATIONS      ED Medication Orders (From admission, onward)    Start Ordered     Status Ordering Provider    05/15/19 1125 05/15/19 1124  sodium chloride 0.9 % bolus 1,000 mL  Once     Route: Intravenous  Ordered Dose: 1,000 mL     Last MAR action: Stopped Aveline Daus D          LABORATORY RESULTS    Ordered and independently interpreted AVAILABLE laboratory tests. Please see results section in chart for full details.  Results for orders placed or performed during the hospital encounter of 05/15/19   CBC and differential   Result Value Ref Range    WBC 5.94 3.10 - 9.50 x10 3/uL    Hgb 14.5 12.5 - 17.1 g/dL    Hematocrit 16.1 09.6 - 49.6 %    Platelets 285 142 - 346 x10 3/uL    RBC 4.75 4.20 - 5.90 x10 6/uL    MCV 89.5 78.0 - 96.0 fL    MCH 30.5 25.1 - 33.5 pg    MCHC 34.1 31.5 - 35.8 g/dL    RDW 13 11 - 15 %    MPV 9.5 8.9 - 12.5 fL    Neutrophils 55.0 None %    Lymphocytes Automated 28.8 None %    Monocytes 8.1 None %    Eosinophils Automated 6.1 None %    Basophils Automated 1.5 None %    Immature Granulocytes 0.5 None %     Nucleated RBC 0.0 0.0 - 0.0 /100 WBC    Neutrophils Absolute 3.27 1.10 - 6.33 x10  3/uL    Lymphocytes Absolute Automated 1.71 0.42 - 3.22 x10 3/uL    Monocytes Absolute Automated 0.48 0.21 - 0.85 x10 3/uL    Eosinophils Absolute Automated 0.36 0.00 - 0.44 x10 3/uL    Basophils Absolute Automated 0.09 (H) 0.00 - 0.08 x10 3/uL    Immature Granulocytes Absolute 0.03 0.00 - 0.07 x10 3/uL    Absolute NRBC 0.00 0.00 - 0.00 x10 3/uL   Comprehensive metabolic panel   Result Value Ref Range    Glucose 217 (H) 70 - 100 mg/dL    BUN 14 9 - 28 mg/dL    Creatinine 1.1 0.7 - 1.3 mg/dL    Sodium 161 096 - 045 mEq/L    Potassium 4.0 3.5 - 5.1 mEq/L    Chloride 102 100 - 111 mEq/L    CO2 23 22 - 29 mEq/L    Calcium 9.0 8.5 - 10.5 mg/dL    Protein, Total 7.7 6.0 - 8.3 g/dL    Albumin 4.3 3.5 - 5.0 g/dL    AST (SGOT) 27 5 - 34 U/L    ALT 33 0 - 55 U/L    Alkaline Phosphatase 77 38 - 106 U/L    Bilirubin, Total 0.5 0.2 - 1.2 mg/dL    Globulin 3.4 2.0 - 3.6 g/dL    Albumin/Globulin Ratio 1.3 0.9 - 2.2    Anion Gap 14.0 5.0 - 15.0   Stool Occult Blood   Result Value Ref Range    Stool Occult Blood Positive (A) NEGATIVE   PT/APTT   Result Value Ref Range    PT 12.4 (L) 12.6 - 15.0 sec    PT INR 0.9 0.9 - 1.1    PTT 28 23 - 37 sec   GFR   Result Value Ref Range    EGFR >60.0        CRITICAL CARE/PROCEDURES    Procedures    DIAGNOSIS      Diagnosis:  Final diagnoses:   Bloody stools       Disposition:  ED Disposition     ED Disposition Condition Date/Time Comment    Discharge  Wed May 15, 2019  2:24 PM Linward Headland discharge to home/self care.    Condition at disposition: Stable            Prescriptions:  Discharge Medication List as of 05/15/2019  2:24 PM      CONTINUE these medications which have NOT CHANGED    Details   FENOFIBRATE PO Take by mouth, Historical Med      LISINOPRIL PO Take by mouth, Historical Med      METFORMIN HCL PO Take by mouth, Historical Med             This note was generated by the Epic EMR system/ Dragon speech  recognition and may contain inherent errors or omissions not intended by the user. Grammatical errors, random word insertions, deletions and pronoun errors  are occasional consequences of this technology due to software limitations. Not all errors are caught or corrected. If there are questions or concerns about the content of this note or information contained within the body of this dictation they should be addressed directly with the author for clarification.     Sheldon Silvan, FNP  05/16/19 2046

## 2019-05-15 NOTE — ED Notes (Signed)
Chaperon- Grace,NP for rectal exam

## 2019-05-16 ENCOUNTER — Telehealth: Payer: Self-pay | Admitting: Internal Medicine

## 2019-05-16 DIAGNOSIS — K625 Hemorrhage of anus and rectum: Secondary | ICD-10-CM

## 2019-05-16 NOTE — Telephone Encounter (Signed)
Spoke with pt and he is aware, scheduled to see Dr. Ardis Hughs tomorrow at 11:30am, pt to come for labs prior to appt, orders in epic.

## 2019-05-16 NOTE — Telephone Encounter (Signed)
Spoke to my partner Dr. Ardis Hughs who will be able to see the patient in the morning in his clinic. Please have the patient come for a stat CBC and basic metabolic panel earlier tomorrow morning so that results will be available when he is seen If he has moderate or high volume rectal bleeding or hematochezia prior to this appointment he should be seen in the ER  Thanks to Dr. Ardis Hughs for his help

## 2019-05-16 NOTE — Telephone Encounter (Signed)
Pt states he is out of town visiting family in [place]. Yesterday he had significant BRB with bowel movements He was seen at [facility], they did lab work and he was not anemic and he had a BM while there and there was no blood in the stool at that point. He was discharged and told to follow-up with his GI doc. This am he is having more BRB, states there was a large amount again. Pt is calling requesting to be seen tomorrow when he gets back in town. There are no available APP appts. Please advise.

## 2019-05-17 ENCOUNTER — Encounter: Payer: Self-pay | Admitting: Gastroenterology

## 2019-05-17 ENCOUNTER — Ambulatory Visit (INDEPENDENT_AMBULATORY_CARE_PROVIDER_SITE_OTHER): Payer: Medicare Other | Admitting: Gastroenterology

## 2019-05-17 ENCOUNTER — Other Ambulatory Visit (INDEPENDENT_AMBULATORY_CARE_PROVIDER_SITE_OTHER): Payer: Medicare Other

## 2019-05-17 ENCOUNTER — Other Ambulatory Visit: Payer: Self-pay

## 2019-05-17 VITALS — BP 136/78 | HR 88 | Temp 96.9°F | Ht 70.75 in | Wt 311.0 lb

## 2019-05-17 DIAGNOSIS — K625 Hemorrhage of anus and rectum: Secondary | ICD-10-CM

## 2019-05-17 LAB — BASIC METABOLIC PANEL
BUN: 12 mg/dL (ref 6–23)
CO2: 28 mEq/L (ref 19–32)
Calcium: 9.6 mg/dL (ref 8.4–10.5)
Chloride: 100 mEq/L (ref 96–112)
Creatinine, Ser: 0.9 mg/dL (ref 0.40–1.50)
GFR: 84.69 mL/min (ref >60.00)
Glucose, Bld: 207 mg/dL — ABNORMAL HIGH (ref 70–99)
Potassium: 3.5 mEq/L (ref 3.5–5.1)
Sodium: 137 mEq/L (ref 135–145)

## 2019-05-17 LAB — CBC WITH DIFFERENTIAL/PLATELET
Basophils Absolute: 0.1 10*3/uL (ref 0.0–0.1)
Basophils Relative: 1.5 % (ref 0.0–3.0)
Eosinophils Absolute: 0.3 10*3/uL (ref 0.0–0.7)
Eosinophils Relative: 5.5 % — ABNORMAL HIGH (ref 0.0–5.0)
HCT: 39.9 % (ref 39.0–52.0)
Hemoglobin: 13.5 g/dL (ref 13.0–17.0)
Lymphocytes Relative: 30.8 % (ref 12.0–46.0)
Lymphs Abs: 1.9 10*3/uL (ref 0.7–4.0)
MCHC: 33.8 g/dL (ref 30.0–36.0)
MCV: 88.8 fl (ref 78.0–100.0)
Monocytes Absolute: 0.5 10*3/uL (ref 0.1–1.0)
Monocytes Relative: 8.5 % (ref 3.0–12.0)
Neutro Abs: 3.4 10*3/uL (ref 1.4–7.7)
Neutrophils Relative %: 53.7 % (ref 43.0–77.0)
Platelets: 297 10*3/uL (ref 150.0–400.0)
RBC: 4.49 Mil/uL (ref 4.22–5.81)
RDW: 13.4 % (ref 11.5–15.5)
WBC: 6.3 10*3/uL (ref 4.0–10.5)

## 2019-05-17 NOTE — Progress Notes (Signed)
HPI: This is a very pleasant 63 year old man whom I am meeting for the first time but is known to our practice from previous colonoscopies as well as rectal bleeding that required brief hospital stay about 1 year ago.  This was felt due to hemorrhoids.  Chief complaint is rectal bleeding  He had a colonoscopy January 2019 with Dr. Hilarie Fredrickson, this showed left-sided diverticulosis, internal and external hemorrhoids, 2 subcentimeter sessile serrated polyps.  He was out of town [place] this past week.  48 hours ago he had a loose stools and a significant amount of blood in the toilet.  This happened twice on Wednesday morning.  He was concerned enough that he presented to a local emergency room there.  Blood test results are below.  He had a nonbloody episode of loose stools while in the emergency room.  He did not require hospital admission.  This morning he had loose stool with a very small amount of bright red blood in it.  He has had no abdominal pains, no nausea or vomiting  Data Reviewed: Blood work May 15, 2019 shows a completely normal CBC (hb 14.5) normal complete metabolic profile, normal coags Blood work this morning just prior to his visit here today also shows normal CBC (Hb 13.5)  and normal basic metabolic profile   Review of systems: Pertinent positive and negative review of systems were noted in the above HPI section. All other review negative.   Past Medical History:  Diagnosis Date  . 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 Chronic kidney disease   . 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 Dental crowns present   . Family history of adverse reaction to anesthesia    pt's father has hx. of post-op N/V  . High triglycerides    no current med.  Marland Kitchen History of gout   . History of kidney stones   . Hypertension    states under control with med., has been on med. ~ 12 yr.  . Non-insulin dependent type 2 diabetes mellitus ([place])   . Obesity,  Class III, BMI 40-49.9 (morbid obesity) ([place])   . OSA on CPAP   . Osteoarthritis    knees (09/14/2016)  . Sleep apnea   . 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 COLONOSCOPY  08/29/2005   per Dr. Sharlett Iles, diverticulosis only, repeat in 10 yrs   . CYSTOSCOPY  2011   "related to trace blood in urine"  . EYE SURGERY Left 1961   traumatic loss of eye  . INGUINAL HERNIA REPAIR Left   . KNEE ARTHROSCOPY Left 1990s?  Marland Kitchen KNEE ARTHROSCOPY WITH MEDIAL MENISECTOMY Right 11/08/2012   Procedure: RIGHT KNEE ARTHROSCOPY WITH PARTIAL MEDIAL AND LATERAL MENISECTOMIES, CHONDROPLASTY;  Surgeon: [name], MD;  Location: [facility];  Service: Orthopedics;  Laterality: Right;  RIGHT KNEE SCOPE WITH PARTIAL MEDIAL AND LATERAL  MENISCECTOMIES  AND CHONDROPLASTY  . KNEE CLOSED REDUCTION Left 08/18/2016   Procedure: LEFT  MANIPULATION KNEE;  Surgeon: [name], MD;  Location: [facility];  Service: Orthopedics;  Laterality: Left;  . LIPOMA EXCISION Left    upper back  . MOHS SURGERY Left 10/2015   "forehead"  . RETINAL TEAR REPAIR CRYOTHERAPY Right 11/2017  . SPINE SURGERY  02/07/2019  . TOTAL KNEE ARTHROPLASTY Left 05/18/2016   Procedure: LEFT  TOTAL KNEE ARTHROPLASTY;  Surgeon: [name], MD;  Location: [facility] [place];  Service: Orthopedics;  Laterality: Left;  . TOTAL KNEE ARTHROPLASTY Right 09/14/2016   Procedure: TOTAL KNEE ARTHROPLASTY MANIPULATION LEFT KNEE;  Surgeon: [name], MD;  Location: [facility];  Service: Orthopedics;  Laterality: Right;    Current Outpatient Medications  Medication Sig Dispense Refill  . diclofenac (VOLTAREN) 75 MG EC tablet Take 1 tablet (75 mg total) by mouth 2 (two) times daily. 60 tablet 2  . fenofibrate 160 MG tablet TAKE 1 TABLET (160 MG TOTAL) BY MOUTH DAILY. 90 tablet 3  . lisinopril-hydrochlorothiazide (ZESTORETIC) 10-12.5 MG tablet TAKE 1 TABLET BY MOUTH  DAILY. 90 tablet 1  . metFORMIN (GLUCOPHAGE) 500 MG tablet Take 1 tablet (500 mg total) by mouth 2 (two) times daily with a meal. 60 tablet 2   No current facility-administered medications for this visit.     Allergies as of 05/17/2019  . (No Known Allergies)    Family History  Problem Relation Age of Onset  . Anesthesia problems Father        post-op N/V  . Colon cancer Neg Hx   . Esophageal cancer Neg Hx   . Rectal cancer Neg Hx   . Stomach cancer Neg Hx     Social History   Socioeconomic History  . Marital status: Married    Spouse name: Not on file  . Number of children: Not on file  . Years of education: Not on file  . Highest education level: Not on file  Occupational History  . Occupation: Visual merchandiser  . Financial resource strain: Not on file  . Food insecurity    Worry: Not on file    Inability: Not on file  . Transportation needs    Medical: Not on file    Non-medical: Not on file  Tobacco Use  . Smoking status: Never Smoker  . Smokeless tobacco: Never Used  Substance and Sexual Activity  . Alcohol use: Yes    Alcohol/week: 3.0 - 4.0 standard drinks    Types: 1 Glasses of wine, 1 Cans of beer, 1 - 2 Standard drinks or equivalent per week  . Drug use: No  . Sexual activity: Yes  Lifestyle  . Physical activity    Days per week: Not on file    Minutes per session: Not on file  . Stress: Not on file  Relationships  . Social Herbalist on phone: Not on file    Gets together: Not on file    Attends religious service: Not on file    Active member of club or organization: Not on file    Attends meetings of clubs or organizations: Not on file    Relationship status: Not on file  . Intimate partner violence    Fear of current or ex partner: Not on file    Emotionally abused: Not on file    Physically abused: Not on file    Forced sexual activity: Not on file  Other Topics Concern  . Not on file  Social History Narrative  . Not on  file     Physical Exam: BP 136/78   Pulse 88   Temp (!) 96.9 F (36.1 C)   Ht 5' 10.75" (1.797 m)   Wt (!) 311 lb (141.1 kg)   BMI 43.68 kg/m    Orthostatic vitals were taken: Laying down, sitting and standing and he had no orthostatic drops in his  blood pressure and no orthostatic increases in his heart rate Constitutional: generally well-appearing, obese Psychiatric: alert and oriented x3 Eyes: extraocular movements intact Mouth: oral pharynx moist, no lesions Neck: supple no lymphadenopathy Cardiovascular: heart regular rate and rhythm Lungs: clear to auscultation bilaterally Abdomen: soft, nontender, nondistended, no obvious ascites, no peritoneal signs, normal bowel sounds Extremities: no lower extremity edema bilaterally Skin: no lesions on visible extremities   Assessment and plan: 63 y.o. male with overt red rectal bleeding, painless  I think this was probably a mild diverticular bleed.  He describes quite a large volume of blood on 2 occasions.  Since then some loose stools but certainly no significant bleeding.  Blood counts have remained normal.  He is nontender on examination and he has had no pain throughout.  He clearly does not require hospital admission.  I reassured him that the bleeding has probably stopped but if it resumes high-volume bleeding he should call our office.  Please see the "Patient Instructions" section for addition details about the plan.   Owens Loffler, MD [facility] 05/17/2019, 11:49 AM  Cc: Laurey Morale, MD

## 2019-05-17 NOTE — Patient Instructions (Signed)
If you see any more blood please follow up with Dr Hilarie Fredrickson.  Thank you for entrusting me with your care and choosing [facility].  Dr Ardis Hughs

## 2019-06-05 ENCOUNTER — Telehealth: Payer: Self-pay | Admitting: Gastroenterology

## 2019-06-05 NOTE — Telephone Encounter (Signed)
Dr. Hilarie Fredrickson, this pt requested to transfer care to Dr. Silverio Decamp because his wife is an ER nurse and has a good relationship with Dr. Silverio Decamp.  Will you approve the switch?

## 2019-06-05 NOTE — Telephone Encounter (Signed)
Okay with me 

## 2019-06-05 NOTE — Telephone Encounter (Signed)
Ok, please schedule next available appointment. Thanks 

## 2019-06-05 NOTE — Telephone Encounter (Signed)
Dr. Silverio Decamp, please see below.  Pt would like to be evaluated for GI bleed and diverticulosis.

## 2019-06-24 MED FILL — metFORMIN HCL 500 MG TABS: 500 | 30 days supply | Qty: 60 | Fill #1

## 2019-07-18 ENCOUNTER — Other Ambulatory Visit (INDEPENDENT_AMBULATORY_CARE_PROVIDER_SITE_OTHER): Payer: Medicare Other

## 2019-07-18 ENCOUNTER — Telehealth: Payer: Self-pay | Admitting: Gastroenterology

## 2019-07-18 ENCOUNTER — Ambulatory Visit (INDEPENDENT_AMBULATORY_CARE_PROVIDER_SITE_OTHER): Payer: Medicare Other | Admitting: Gastroenterology

## 2019-07-18 ENCOUNTER — Encounter: Payer: Self-pay | Admitting: Gastroenterology

## 2019-07-18 VITALS — BP 128/60 | HR 80 | Temp 98.0°F | Ht 70.5 in | Wt 313.0 lb

## 2019-07-18 DIAGNOSIS — E1369 Other specified diabetes mellitus with other specified complication: Secondary | ICD-10-CM

## 2019-07-18 DIAGNOSIS — K760 Fatty (change of) liver, not elsewhere classified: Secondary | ICD-10-CM

## 2019-07-18 DIAGNOSIS — K625 Hemorrhage of anus and rectum: Secondary | ICD-10-CM

## 2019-07-18 LAB — COMPREHENSIVE METABOLIC PANEL
ALT: 28 U/L (ref 0–53)
AST: 25 U/L (ref 0–37)
Albumin: 4.5 g/dL (ref 3.5–5.2)
Alkaline Phosphatase: 60 U/L (ref 39–117)
BUN: 15 mg/dL (ref 6–23)
CO2: 28 mEq/L (ref 19–32)
Calcium: 9.6 mg/dL (ref 8.4–10.5)
Chloride: 101 mEq/L (ref 96–112)
Creatinine, Ser: 0.91 mg/dL (ref 0.40–1.50)
GFR: 83.57 mL/min (ref >60.00)
Glucose, Bld: 206 mg/dL — ABNORMAL HIGH (ref 70–99)
Potassium: 4.1 mEq/L (ref 3.5–5.1)
Sodium: 138 mEq/L (ref 135–145)
Total Bilirubin: 0.5 mg/dL (ref 0.2–1.2)
Total Protein: 7.7 g/dL (ref 6.0–8.3)

## 2019-07-18 LAB — CBC WITH DIFFERENTIAL/PLATELET
Basophils Absolute: 0.1 10*3/uL (ref 0.0–0.1)
Basophils Relative: 0.9 % (ref 0.0–3.0)
Eosinophils Absolute: 0.4 10*3/uL (ref 0.0–0.7)
Eosinophils Relative: 7.8 % — ABNORMAL HIGH (ref 0.0–5.0)
HCT: 41.2 % (ref 39.0–52.0)
Hemoglobin: 13.7 g/dL (ref 13.0–17.0)
Lymphocytes Relative: 28.7 % (ref 12.0–46.0)
Lymphs Abs: 1.6 10*3/uL (ref 0.7–4.0)
MCHC: 33.3 g/dL (ref 30.0–36.0)
MCV: 89.7 fl (ref 78.0–100.0)
Monocytes Absolute: 0.5 10*3/uL (ref 0.1–1.0)
Monocytes Relative: 8.5 % (ref 3.0–12.0)
Neutro Abs: 3 10*3/uL (ref 1.4–7.7)
Neutrophils Relative %: 54.1 % (ref 43.0–77.0)
Platelets: 289 10*3/uL (ref 150.0–400.0)
RBC: 4.59 Mil/uL (ref 4.22–5.81)
RDW: 13.6 % (ref 11.5–15.5)
WBC: 5.6 10*3/uL (ref 4.0–10.5)

## 2019-07-18 LAB — HEMOGLOBIN A1C: Hgb A1c MFr Bld: 8.3 % — ABNORMAL HIGH (ref 4.6–6.5)

## 2019-07-18 NOTE — Patient Instructions (Addendum)
Take benefiber 1 tablespoon three times daily with meals  Start a Low calorie and low fat diet  Go to the basement today for labs  Follow up with Dr Sarajane Jews, your PCP    Lactose-Free Diet, Adult If you have lactose intolerance, you are not able to digest lactose. Lactose is a natural sugar found mainly in dairy milk and dairy products. You may need to avoid all foods and beverages that contain lactose. A lactose-free diet can help you do this. Which foods have lactose? Lactose is found in dairy milk and dairy products, such as:  Yogurt.  Cheese.  Butter.  Margarine.  Sour cream.  Cream.  Whipped toppings and nondairy creamers.  Ice cream and other dairy-based desserts. Lactose is also found in foods or products made with dairy milk or milk ingredients. To find out whether a food contains dairy milk or a milk ingredient, look at the ingredients list. Avoid foods with the statement "May contain milk" and foods that contain:  Milk powder.  Whey.  Curd.  Caseinate.  Lactose.  Lactalbumin.  Lactoglobulin. What are alternatives to dairy milk and foods made with milk products?  Lactose-free milk.  Soy milk with added calcium and vitamin D.  Almond milk, coconut milk, rice milk, or other nondairy milk alternatives with added calcium and vitamin D. Note that these are low in protein.  Soy products, such as soy yogurt, soy cheese, soy ice cream, and soy-based sour cream.  Other nut milk products, such as almond yogurt, almond cheese, cashew yogurt, cashew cheese, cashew ice cream, coconut yogurt, and coconut ice cream. What are tips for following this plan?  Do not consume foods, beverages, vitamins, minerals, or medicines containing lactose. Read ingredient lists carefully.  Look for the words "lactose-free" on labels.  Use lactase enzyme drops or tablets as directed by your health care provider.  Use lactose-free milk or a milk alternative, such as soy milk or  almond milk, for drinking and cooking.  Make sure you get enough calcium and vitamin D in your diet. A lactose-free eating plan can be lacking in these important nutrients.  Take calcium and vitamin D supplements as directed by your health care provider. Talk to your health care provider about supplements if you are not able to get enough calcium and vitamin D from food. What foods can I eat?  Fruits All fresh, canned, frozen, or dried fruits that are not processed with lactose. Vegetables All fresh, frozen, and canned vegetables without cheese, cream, or butter sauces. Grains Any that are not made with dairy milk or dairy products. Meats and other proteins Any meat, fish, poultry, and other protein sources that are not made with dairy milk or dairy products. Soy cheese and yogurt. Fats and oils Any that are not made with dairy milk or dairy products. Beverages Lactose-free milk. Soy, rice, or almond milk with added calcium and vitamin D. Fruit and vegetable juices. Sweets and desserts Any that are not made with dairy milk or dairy products. Seasonings and condiments Any that are not made with dairy milk or dairy products. Calcium Calcium is found in many foods that contain lactose and is important for bone health. The amount of calcium you need depends on your age:  Adults younger than 50 years: 1,000 mg of calcium a day.  Adults older than 50 years: 1,200 mg of calcium a day. If you are not getting enough calcium, you may get it from other sources, including:  Orange juice with  calcium added. There are 300-350 mg of calcium in 1 cup of orange juice.  Calcium-fortified soy milk. There are 300-400 mg of calcium in 1 cup of calcium-fortified soy milk.  Calcium-fortified rice or almond milk. There are 300 mg of calcium in 1 cup of calcium-fortified rice or almond milk.  Calcium-fortified breakfast cereals. There are 100-1,000 mg of calcium in calcium-fortified breakfast  cereals.  Spinach, cooked. There are 145 mg of calcium in  cup of cooked spinach.  Edamame, cooked. There are 130 mg of calcium in  cup of cooked edamame.  Collard greens, cooked. There are 125 mg of calcium in  cup of cooked collard greens.  Kale, frozen or cooked. There are 90 mg of calcium in  cup of cooked or frozen kale.  Almonds. There are 95 mg of calcium in  cup of almonds.  Broccoli, cooked. There are 60 mg of calcium in 1 cup of cooked broccoli. The items listed above may not be a complete list of recommended foods and beverages. Contact a dietitian for more options. What foods are not recommended? Fruits None, unless they are made with dairy milk or dairy products. Vegetables None, unless they are made with dairy milk or dairy products. Grains Any grains that are made with dairy milk or dairy products. Meats and other proteins None, unless they are made with dairy milk or dairy products. Dairy All dairy products, including milk, goat's milk, buttermilk, kefir, acidophilus milk, flavored milk, evaporated milk, condensed milk, dulce de [place], eggnog, yogurt, cheese, and cheese spreads. Fats and oils Any that are made with milk or milk products. Margarines and salad dressings that contain milk or cheese. Cream. Half and half. Cream cheese. Sour cream. Chip dips made with sour cream or yogurt. Beverages Hot chocolate. Cocoa with lactose. Instant iced teas. Powdered fruit drinks. Smoothies made with dairy milk or yogurt. Sweets and desserts Any that are made with milk or milk products. Seasonings and condiments Chewing gum that has lactose. Spice blends if they contain lactose. Artificial sweeteners that contain lactose. Nondairy creamers. The items listed above may not be a complete list of foods and beverages to avoid. Contact a dietitian for more information. Summary  If you are lactose intolerant, it means that you have a hard time digesting lactose, a natural  sugar found in milk and milk products.  Following a lactose-free diet can help you manage this condition.  Calcium is important for bone health and is found in many foods that contain lactose. Talk with your health care provider about other sources of calcium. This information is not intended to replace advice given to you by your health care provider. Make sure you discuss any questions you have with your health care provider. Document Released: 01/21/2002 Document Revised: 08/29/2017 Document Reviewed: 08/29/2017 Elsevier Patient Education  2020 [employer].

## 2019-07-18 NOTE — Progress Notes (Signed)
Jermaine James    [id number]    1956-06-07  Primary Care Physician:Fry, Ishmael Holter, MD  Referring Physician: Laurey Morale, MD [place],  [place] [id number]   Chief complaint:  BRBPR, hemorrhoids, LLQ abd cramps  HPI: 63 year old very pleasant Caucasian man here for evaluation of intermittent rectal bleeding and left lower quadrant abdominal cramping.  He was previously seen by Dr. Hilarie Fredrickson and Dr. Ardis Hughs and is here for second opinion He has been having intermittent loose stool and abdominal cramping for past 30 days 30 days No rectal bleeding for past 2 months.  He had an episode of small-volume BRBPR when wiping after bowel movement 2 weeks ago   CBC Latest Ref Rng & Units 05/17/2019 01/30/2019 05/14/2018  WBC 4.0 - 10.5 K/uL 6.3 7.3 6.3  Hemoglobin 13.0 - 17.0 g/dL 13.5 14.2 13.3  Hematocrit 39.0 - 52.0 % 39.9 43.1 40.9  Platelets 150.0 - 400.0 K/uL 297.0 284 268    colonoscopy January 2019 with Dr. Hilarie Fredrickson, this showed left-sided diverticulosis, internal and external hemorrhoids, 2 subcentimeter sessile serrated polyps.  Denies any dysphagia, odynophagia, melena, nausea, vomiting, decreased appetite or weight loss.  He has progressively gained weight over the last few years.  Outpatient Encounter Medications as of 07/18/2019  Medication Sig  . diclofenac (VOLTAREN) 75 MG EC tablet Take 1 tablet (75 mg total) by mouth 2 (two) times daily.  . fenofibrate 160 MG tablet TAKE 1 TABLET (160 MG TOTAL) BY MOUTH DAILY.  Marland Kitchen lisinopril-hydrochlorothiazide (ZESTORETIC) 10-12.5 MG tablet TAKE 1 TABLET BY MOUTH DAILY.  . metFORMIN (GLUCOPHAGE) 500 MG tablet Take 1 tablet (500 mg total) by mouth 2 (two) times daily with a meal.   No facility-administered encounter medications on file as of 07/18/2019.     Allergies as of 07/18/2019  . (No Known Allergies)    Past Medical History:  Diagnosis Date  . 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  Chronic kidney disease   . 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 Dental crowns present   . Family history of adverse reaction to anesthesia    pt's father has hx. of post-op N/V  . High triglycerides    no current med.  Marland Kitchen History of gout   . History of kidney stones   . Hypertension    states under control with med., has been on med. ~ 65 yr.  . Non-insulin dependent type 2 diabetes mellitus ([place])   . Obesity, Class III, BMI 40-49.9 (morbid obesity) ([place])   . OSA on CPAP   . Osteoarthritis    knees (09/14/2016)  . Sleep apnea   . 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 COLONOSCOPY  08/29/2005   per Dr. Sharlett Iles, diverticulosis only, repeat in 10 yrs   . CYSTOSCOPY  2011   "related to trace blood in urine"  . EYE SURGERY Left 1961   traumatic loss of eye  . INGUINAL HERNIA REPAIR Left   . KNEE ARTHROSCOPY Left 1990s?  Marland Kitchen KNEE ARTHROSCOPY WITH MEDIAL MENISECTOMY Right 11/08/2012   Procedure: RIGHT KNEE ARTHROSCOPY WITH PARTIAL MEDIAL AND LATERAL MENISECTOMIES, CHONDROPLASTY;  Surgeon: [name], MD;  Location: [place];  Service: Orthopedics;  Laterality: Right;  RIGHT KNEE SCOPE WITH PARTIAL MEDIAL AND LATERAL  MENISCECTOMIES  AND  CHONDROPLASTY  . KNEE CLOSED REDUCTION Left 08/18/2016   Procedure: LEFT  MANIPULATION KNEE;  Surgeon: [name], MD;  Location: [place];  Service: Orthopedics;  Laterality: Left;  . LIPOMA EXCISION Left    upper back  . MOHS SURGERY Left 10/2015   "forehead"  . RETINAL TEAR REPAIR CRYOTHERAPY Right 11/2017  . SPINE SURGERY  02/07/2019  . TOTAL KNEE ARTHROPLASTY Left 05/18/2016   Procedure: LEFT TOTAL KNEE ARTHROPLASTY;  Surgeon: [name], MD;  Location: [place];  Service: Orthopedics;  Laterality: Left;  . TOTAL KNEE ARTHROPLASTY Right 09/14/2016   Procedure: TOTAL KNEE ARTHROPLASTY MANIPULATION LEFT KNEE;  Surgeon: [name],  MD;  Location: [facility];  Service: Orthopedics;  Laterality: Right;    Family History  Problem Relation Age of Onset  . Anesthesia problems Father        post-op N/V  . Colon cancer Neg Hx   . Esophageal cancer Neg Hx   . Rectal cancer Neg Hx   . Stomach cancer Neg Hx     Social History   Socioeconomic History  . Marital status: Married    Spouse name: Not on file  . Number of children: Not on file  . Years of education: Not on file  . Highest education level: Not on file  Occupational History  . Occupation: Visual merchandiser  . Financial resource strain: Not on file  . Food insecurity    Worry: Not on file    Inability: Not on file  . Transportation needs    Medical: Not on file    Non-medical: Not on file  Tobacco Use  . Smoking status: Never Smoker  . Smokeless tobacco: Never Used  Substance and Sexual Activity  . Alcohol use: Yes    Alcohol/week: 3.0 - 4.0 standard drinks    Types: 1 Glasses of wine, 1 Cans of beer, 1 - 2 Standard drinks or equivalent per week  . Drug use: No  . Sexual activity: Yes  Lifestyle  . Physical activity    Days per week: Not on file    Minutes per session: Not on file  . Stress: Not on file  Relationships  . Social Herbalist on phone: Not on file    Gets together: Not on file    Attends religious service: Not on file    Active member of club or organization: Not on file    Attends meetings of clubs or organizations: Not on file    Relationship status: Not on file  . Intimate partner violence    Fear of current or ex partner: Not on file    Emotionally abused: Not on file    Physically abused: Not on file    Forced sexual activity: Not on file  Other Topics Concern  . Not on file  Social History Narrative  . Not on file      Review of systems: Review of Systems  Constitutional: Negative for fever and chills.  HENT: Negative.   Eyes: Negative for blurred vision.  Respiratory: Negative for cough, shortness  of breath and wheezing.   Cardiovascular: Negative for chest pain and palpitations.  Gastrointestinal: as per HPI Genitourinary: Negative for dysuria, urgency, frequency and hematuria.  Musculoskeletal: Negative for myalgias, back pain and joint pain.  Skin: Negative for itching and rash.  Neurological: Negative for dizziness, tremors, focal weakness, seizures and loss of consciousness.  Endo/Heme/Allergies: Positive for seasonal allergies.  Psychiatric/Behavioral:  Negative for depression, suicidal ideas and hallucinations.  All other systems reviewed and are negative.   Physical Exam: Vitals:   07/18/19 1007  BP: 128/60  Pulse: 80  Temp: 98 F (36.7 C)   Body mass index is 44.28 kg/m. Gen:      No acute distress HEENT:  EOMI, sclera anicteric Neck:     No masses; no thyromegaly Lungs:    Clear to auscultation bilaterally; normal respiratory effort CV:         Regular rate and rhythm; no murmurs Abd:      + bowel sounds; soft, non-tender; no palpable masses, no distension Ext:    No edema; adequate peripheral perfusion Skin:      Warm and dry; no rash Neuro: alert and oriented x 3 Psych: normal mood and affect Rectal exam: Perianal tinea cruris, normal anal sphincter tone, no anal fissure or external hemorrhoids Anoscopy: Small internal hemorrhoids, no active bleeding, normal dentate line, no visible nodules  Data Reviewed:  Reviewed labs, radiology imaging, old records and pertinent past GI work up   Assessment and Plan/Recommendations:  63 year old male with history of morbid obesity, diabetes, diverticulosis with irregular bowel habits, left lower quadrant abdominal discomfort and intermittent rectal bleeding  Irritable bowel syndrome predominant diarrhea: Trial of lactose-free diet to exclude lactose intolerance Start Benefiber 1 tablespoon 3 times daily with meals  Intermittent rectal bleeding: Small-volume likely secondary to hemorrhage from internal hemorrhoids  Continue high-fiber diet We will recheck CBC  Tinea cruris: Apply miconazole topical cream for 2 weeks Advised him to avoid moisture buildup and try to keep the perianal area as dry as possible  Morbid obesity with fatty liver: Discussed diet and exercise changes Low calorie and low fat diet Goal weight loss 10% of body weight in the next 6 months Check hemoglobin A1c, and CMP   40 minutes was spent face-to-face with the patient. Greater than 50% of the time used for counseling as well as treatment plan and follow-up. He had multiple questions which were answered to his satisfaction  K. Denzil Magnuson , MD    CC: Laurey Morale, MD

## 2019-07-19 MED ORDER — MICONAZOLE NITRATE 2 % EX CREA: 1.0000 "application " | TOPICAL_CREAM | Freq: Two times a day (BID) | CUTANEOUS | 1 refills | Status: DC

## 2019-07-19 NOTE — Telephone Encounter (Signed)
L/M for patient that medication was sent to pharmacy

## 2019-07-19 NOTE — Telephone Encounter (Signed)
Please send Rx for Miconazole 2% twice daily for 2 weeks, given 1 additional refill. Thanks

## 2019-07-19 NOTE — Telephone Encounter (Signed)
Dr Silverio Decamp Please advise I didn't have orders from you for a fungal cream   Thanks

## 2019-07-23 ENCOUNTER — Encounter: Payer: Self-pay | Admitting: Gastroenterology

## 2019-07-26 MED FILL — metFORMIN HCL 500 MG TABS: 500 | 30 days supply | Qty: 60 | Fill #2

## 2019-08-13 ENCOUNTER — Other Ambulatory Visit: Payer: Self-pay | Admitting: Family Medicine

## 2019-08-13 MED FILL — LISINOPRIL-HCTZ 10-12.5 MG: 10-12.5 | 90 days supply | Qty: 90 | Fill #0

## 2019-08-13 MED FILL — FENOFIBRATE 160 MG TABLET: 160 | 90 days supply | Qty: 90 | Fill #2

## 2019-08-28 ENCOUNTER — Other Ambulatory Visit: Payer: Self-pay | Admitting: Family Medicine

## 2019-08-28 ENCOUNTER — Telehealth: Payer: Self-pay | Admitting: Family Medicine

## 2019-08-28 DIAGNOSIS — E119 Type 2 diabetes mellitus without complications: Secondary | ICD-10-CM

## 2019-08-28 MED FILL — metFORMIN HCL 500 MG TABS: 500 | 30 days supply | Qty: 60 | Fill #0

## 2019-08-28 NOTE — Telephone Encounter (Signed)
His recent A1c was 8.3, so at his wife's request I have referred him to Endocrine (Dr. Buddy Duty)

## 2019-09-09 MED FILL — HYDROCORTISONE 2.5% CREAM: 2.5 | 30 days supply | Qty: 454 | Fill #0 | Status: TO

## 2019-09-10 MED FILL — SOD SULFACET-SULFUR 10-5% C: 10-5 | 170 days supply | Qty: 170 | Fill #0 | Status: TO

## 2019-09-10 MED FILL — HYDROCORTISONE 2.5% CREAM: 2.5 | 30 days supply | Qty: 454 | Fill #0

## 2019-09-10 MED FILL — SOD SULFACET-SULFUR 10-5% C: 10-5 | 30 days supply | Qty: 170 | Fill #0

## 2019-09-13 ENCOUNTER — Encounter: Payer: Self-pay | Admitting: Family Medicine

## 2019-09-13 DIAGNOSIS — E119 Type 2 diabetes mellitus without complications: Secondary | ICD-10-CM

## 2019-09-13 NOTE — Telephone Encounter (Signed)
I referred him to Dr. Cruzita Lederer

## 2019-09-16 HISTORY — PX: OTHER SURGICAL HISTORY: SHX169

## 2019-09-27 ENCOUNTER — Encounter: Payer: Self-pay | Admitting: Family Medicine

## 2019-09-28 MED ORDER — METFORMIN HCL 500 MG PO TABS: ORAL_TABLET | ORAL | 0 refills | Status: DC

## 2019-09-28 NOTE — Telephone Encounter (Signed)
I refilled this for another 30 days

## 2019-09-30 MED FILL — metFORMIN HCL 500 MG TABS: 500 | 30 days supply | Qty: 60 | Fill #0

## 2019-10-01 ENCOUNTER — Ambulatory Visit (HOSPITAL_COMMUNITY)
Admission: EM | Admit: 2019-10-01 | Discharge: 2019-10-01 | Disposition: A | Discharge status: Home / Self Care | Payer: Medicare Other | Attending: Family Medicine | Admitting: Family Medicine

## 2019-10-01 ENCOUNTER — Other Ambulatory Visit: Payer: Self-pay

## 2019-10-01 ENCOUNTER — Encounter (HOSPITAL_COMMUNITY): Payer: Self-pay

## 2019-10-01 DIAGNOSIS — E785 Hyperlipidemia, unspecified: Secondary | ICD-10-CM | POA: Diagnosis not present

## 2019-10-01 DIAGNOSIS — R05 Cough: Secondary | ICD-10-CM | POA: Diagnosis not present

## 2019-10-01 DIAGNOSIS — Z79899 Other long term (current) drug therapy: Secondary | ICD-10-CM | POA: Diagnosis not present

## 2019-10-01 DIAGNOSIS — R3129 Other microscopic hematuria: Secondary | ICD-10-CM | POA: Diagnosis not present

## 2019-10-01 DIAGNOSIS — R0789 Other chest pain: Secondary | ICD-10-CM | POA: Insufficient documentation

## 2019-10-01 DIAGNOSIS — Z7984 Long term (current) use of oral hypoglycemic drugs: Secondary | ICD-10-CM | POA: Diagnosis not present

## 2019-10-01 DIAGNOSIS — K625 Hemorrhage of anus and rectum: Secondary | ICD-10-CM | POA: Diagnosis not present

## 2019-10-01 DIAGNOSIS — M48062 Spinal stenosis, lumbar region with neurogenic claudication: Secondary | ICD-10-CM | POA: Diagnosis not present

## 2019-10-01 DIAGNOSIS — N189 Chronic kidney disease, unspecified: Secondary | ICD-10-CM | POA: Diagnosis not present

## 2019-10-01 DIAGNOSIS — I129 Hypertensive chronic kidney disease with stage 1 through stage 4 chronic kidney disease, or unspecified chronic kidney disease: Secondary | ICD-10-CM | POA: Insufficient documentation

## 2019-10-01 DIAGNOSIS — R0602 Shortness of breath: Secondary | ICD-10-CM | POA: Diagnosis not present

## 2019-10-01 DIAGNOSIS — Z6841 Body Mass Index (BMI) 40.0 and over, adult: Secondary | ICD-10-CM | POA: Insufficient documentation

## 2019-10-01 DIAGNOSIS — Z20822 Contact with and (suspected) exposure to covid-19: Secondary | ICD-10-CM | POA: Diagnosis not present

## 2019-10-01 DIAGNOSIS — R0781 Pleurodynia: Secondary | ICD-10-CM | POA: Diagnosis not present

## 2019-10-01 DIAGNOSIS — R519 Headache, unspecified: Secondary | ICD-10-CM | POA: Insufficient documentation

## 2019-10-01 DIAGNOSIS — H5462 Unqualified visual loss, left eye, normal vision right eye: Secondary | ICD-10-CM | POA: Diagnosis not present

## 2019-10-01 DIAGNOSIS — M109 Gout, unspecified: Secondary | ICD-10-CM | POA: Insufficient documentation

## 2019-10-01 DIAGNOSIS — E118 Type 2 diabetes mellitus with unspecified complications: Secondary | ICD-10-CM | POA: Diagnosis not present

## 2019-10-01 DIAGNOSIS — J069 Acute upper respiratory infection, unspecified: Secondary | ICD-10-CM | POA: Diagnosis not present

## 2019-10-01 DIAGNOSIS — G4733 Obstructive sleep apnea (adult) (pediatric): Secondary | ICD-10-CM | POA: Diagnosis not present

## 2019-10-01 MED ORDER — DM-GUAIFENESIN ER 30-600 MG PO TB12: 1.0000 | ORAL_TABLET | Freq: Two times a day (BID) | ORAL | 0 refills | Status: AC

## 2019-10-01 MED ORDER — FLUTICASONE PROPIONATE 50 MCG/ACT NA SUSP: 1.0000 | Freq: Every day | NASAL | 0 refills | Status: DC

## 2019-10-01 MED ORDER — CYCLOBENZAPRINE HCL 5 MG PO TABS: 5.0000 mg | ORAL_TABLET | Freq: Two times a day (BID) | ORAL | 0 refills | Status: DC | PRN

## 2019-10-01 MED ORDER — IBUPROFEN 600 MG PO TABS: 600.0000 mg | ORAL_TABLET | Freq: Four times a day (QID) | ORAL | 0 refills | Status: DC | PRN

## 2019-10-01 MED FILL — CYCLOBENZAPRINE HCL 5 MG TA: 5 | 6 days supply | Qty: 24 | Fill #0

## 2019-10-01 MED FILL — FLUTICASONE PROP 50 MCG SPR: 50 | 30 days supply | Qty: 16 | Fill #0

## 2019-10-01 MED FILL — IBUPROFEN 600 MG TABLET: 600 | 7 days supply | Qty: 30 | Fill #0

## 2019-10-01 NOTE — Discharge Instructions (Signed)
COVID swab pending Begin Flonase nasal spray 1 to 2 spray in each nostril daily, Mucinex DM twice daily for further drainage/cough  Use anti-inflammatories for pain/swelling. You may take up to 800 mg Ibuprofen every 8 hours with food. You may supplement Ibuprofen with Tylenol [id number] mg every 8 hours.  You may use flexeril as needed to help with pain. This is a muscle relaxer and causes sedation- please use only at bedtime or when you will be home and not have to drive/work  Please follow-up if developing increased cough, fevers, increased pain or shortness of breath

## 2019-10-01 NOTE — ED Triage Notes (Signed)
Pt c/o SOBOE and while lying down that leads to mid right rib pain since Friday.   Pt states he received his second COVID shot on 02/09 and approx 48 hours later he started experiencing "flu-like symptoms" cough, HA, body aches, runny nose, congestion.  Denies abd pain, n/v/d, fever, chills.   Wears a CPAP at night, experiences SOB, and then rib pain occurs with increase resp effort.

## 2019-10-02 NOTE — ED Provider Notes (Addendum)
[facility]    CSN: [id number] Arrival date & time: 10/01/19  1550      History   Chief Complaint Chief Complaint  Patient presents with   Chest Pain   Headache   Shortness of Breath    HPI Jermaine James. is a 64 y.o. male presenting today for evaluation of right-sided rib pain.  Patient states that on Friday he began to develop discomfort in his right rib cage and right mid back.  Pain worsens with coughing and deep inspiration.  He denies any specific injury or fall, but does note the day prior he did lift a heavy laundry bag above his shoulder level.  He is unsure if this may have triggered any particular injury in his rib cage.  He has noted he has been more short of breath of recently and feels as if he has been doing more shallow breathing.  Denies associated chest pain.Denies dizziness or lightheadedness.  Also of note he received his second Covid vaccination on 2/09, developed some mild flulike symptoms afterward which have improved, but has continued to have a mild cough and mucus production.  Denies fevers.  Pain has been causing him to have difficulty sleeping.  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 of post-op N/V   High triglycerides    no current med.   History of gout    History of kidney stones    Hypertension    states under control with med., has been on med. ~ 20 yr.   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   S/P MOHS    Patient Active Problem List   Diagnosis Date Noted   Lumbar stenosis with neurogenic claudication  02/07/2019   Personal history of spine surgery 02/07/2019   Rectal bleed 05/13/2018   Primary localized osteoarthritis of right knee 09/14/2016   S/P total knee arthroplasty 05/18/2016   OSA (obstructive sleep apnea) 07/03/2012   Cellulitis 07/01/2012   Primary osteoarthritis of left knee 10/28/2009   MICROSCOPIC HEMATURIA 08/04/2009   ACHILLES TENDINITIS 07/02/2008   GOUT 10/02/2007   PSA, INCREASED 08/13/2007   Type 2 diabetes mellitus ([place]) 04/20/2007   HYPERLIPIDEMIA 04/20/2007   SKIN TAG 04/20/2007   Essential hypertension 03/26/2007   ALLERGIC RHINITIS 03/26/2007    Past Surgical History:  Procedure Laterality Date   COLONOSCOPY  08/29/2005   per Dr. Sharlett Iles, diverticulosis only, repeat in 10 yrs    CYSTOSCOPY  2011   "related to trace blood in urine"   EYE SURGERY Left 1961   traumatic loss of eye   INGUINAL HERNIA REPAIR Left    KNEE ARTHROSCOPY Left 1990s?   KNEE ARTHROSCOPY WITH MEDIAL MENISECTOMY Right 11/08/2012   Procedure: RIGHT KNEE ARTHROSCOPY WITH PARTIAL MEDIAL AND LATERAL MENISECTOMIES, CHONDROPLASTY;  Surgeon: [name], MD;  Location: [facility];  Service: Orthopedics;  Laterality: Right;  RIGHT KNEE SCOPE WITH PARTIAL MEDIAL AND LATERAL  MENISCECTOMIES  AND CHONDROPLASTY  KNEE CLOSED REDUCTION Left 08/18/2016   Procedure: LEFT  MANIPULATION KNEE;  Surgeon: [name], MD;  Location: [facility];  Service: Orthopedics;  Laterality: Left;   LIPOMA EXCISION Left    upper back   MOHS SURGERY Left 10/2015   "forehead"   RETINAL TEAR REPAIR CRYOTHERAPY Right 11/2017   SPINE SURGERY  02/07/2019   TOTAL KNEE ARTHROPLASTY Left 05/18/2016   Procedure: LEFT TOTAL KNEE ARTHROPLASTY;  Surgeon: [name], MD;  Location: [facility];  Service: Orthopedics;  Laterality: Left;   TOTAL KNEE ARTHROPLASTY Right 09/14/2016   Procedure: TOTAL KNEE ARTHROPLASTY MANIPULATION LEFT KNEE;  Surgeon: [name], MD;  Location: [place];  Service: Orthopedics;  Laterality: Right;       Home Medications    Prior to Admission medications   Medication Sig Start Date End Date Taking? Authorizing Provider  fenofibrate 160 MG tablet TAKE 1 TABLET (160 MG TOTAL) BY MOUTH DAILY. 02/12/19  Yes Laurey Morale, MD  lisinopril-hydrochlorothiazide (ZESTORETIC) 10-12.5 MG tablet TAKE 1 TABLET BY MOUTH DAILY. 08/13/19  Yes Laurey Morale, MD  metFORMIN (GLUCOPHAGE) 500 MG tablet TAKE 1 TABLET (500 MG TOTAL) BY MOUTH 2 TIMES DAILY WITH A MEAL. 09/28/19  Yes Laurey Morale, MD  cyclobenzaprine (FLEXERIL) 5 MG tablet Take 1-2 tablets (5-10 mg total) by mouth 2 (two) times daily as needed for muscle spasms. 10/01/19   Randy Whitener C, PA-C  dextromethorphan-guaiFENesin (MUCINEX DM) 30-600 MG 12hr tablet Take 1 tablet by mouth 2 (two) times daily for 10 days. 10/01/19 10/11/19  Umeka Wrench C, PA-C  fluticasone (FLONASE) 50 MCG/ACT nasal spray Place 1-2 sprays into both nostrils daily for 7 days. 10/01/19 10/08/19  Detra Bores C, PA-C  ibuprofen (ADVIL) 600 MG tablet Take 1 tablet (600 mg total) by mouth every 6 (six) hours as needed. 10/01/19   Raywood Wailes C, PA-C  LISINOPRIL PO Take by mouth.    [provider]  FENOFIBRATE PO Take by mouth.  10/01/19  [provider]    Family History Family History  Problem Relation Age of Onset   Anesthesia problems Father        post-op N/V   Colon cancer Neg Hx    Esophageal cancer Neg Hx    Rectal cancer Neg Hx    Stomach cancer Neg Hx     Social History Social History   Tobacco Use   Smoking status: Never Smoker   Smokeless tobacco: Never Used  Substance Use Topics   Alcohol use: Yes    Alcohol/week: 3.0 - 4.0 standard drinks    Types: 1 Glasses of wine, 1 Cans of beer, 1 - 2 Standard drinks or equivalent per week   Drug use: No     Allergies   Patient has no known allergies.   Review of Systems Review of Systems    Constitutional: Negative for activity change, appetite change, chills, fatigue and fever.  HENT: Positive for congestion. Negative for ear pain, rhinorrhea, sinus pressure, sore throat and trouble swallowing.   Eyes: Negative for discharge and redness.  Respiratory: Positive for cough and shortness of breath. Negative for chest tightness.   Cardiovascular: Positive for chest pain.  Gastrointestinal: Negative for abdominal pain, diarrhea, nausea and vomiting.  Musculoskeletal: Negative for myalgias.  Skin: Negative for rash.  Neurological: Negative for dizziness, light-headedness and headaches.     Physical Exam Triage Vital Signs ED Triage Vitals  Enc Vitals Group  BP 10/01/19 1650 (!) 153/93     Pulse Rate 10/01/19 1650 (!) 107     Resp 10/01/19 1650 (!) 22     Temp 10/01/19 1650 98.5 F (36.9 C)     Temp Source 10/01/19 1650 Oral     SpO2 10/01/19 1650 96 %     Weight --      Height --      Head Circumference --      Peak Flow --      Pain Score 10/01/19 1643 2     Pain Loc --      Pain Edu? --      Excl. in [place]? --    No data found.  Updated Vital Signs BP (!) 153/93 (BP Location: Right Arm)    Pulse (!) 107    Temp 98.5 F (36.9 C) (Oral)    Resp (!) 22    SpO2 96%   Visual Acuity Right Eye Distance:   Left Eye Distance:   Bilateral Distance:    Right Eye Near:   Left Eye Near:    Bilateral Near:     Physical Exam Vitals and nursing note reviewed.  Constitutional:      Appearance: He is well-developed.     Comments: No acute distress  HENT:     Head: Normocephalic and atraumatic.     Ears:     Comments: Bilateral ears without tenderness to palpation of external auricle, tragus and mastoid, EAC's without erythema or swelling, TM's with good bony landmarks and cone of light. Non erythematous.     Nose: Nose normal.     Mouth/Throat:     Comments: Oral mucosa pink and moist, no tonsillar enlargement or exudate. Posterior pharynx patent and  nonerythematous, no uvula deviation or swelling. Normal phonation.  Eyes:     Conjunctiva/sclera: Conjunctivae normal.  Cardiovascular:     Rate and Rhythm: Normal rate.  Pulmonary:     Effort: Pulmonary effort is normal. No respiratory distress.     Comments: Breathing comfortably at rest, CTABL, no wheezing, rales or other adventitious sounds auscultated No anterior chest tenderness Abdominal:     General: There is no distension.  Musculoskeletal:        General: Normal range of motion.     Cervical back: Neck supple.     Comments: Tender to palpation along mid to lower rib cage mid axillary line and extending posteriorly into right thoracic musculature/posterior rib area.  Skin:    General: Skin is warm and dry.  Neurological:     Mental Status: He is alert and oriented to person, place, and time.      UC Treatments / Results  Labs (all labs ordered are listed, but only abnormal results are displayed) Labs Reviewed  NOVEL CORONAVIRUS, NAA (HOSP ORDER, SEND-OUT TO REF LAB; TAT 18-24 HRS)    EKG   Radiology No results found.  Procedures Procedures (including critical care time)  Medications Ordered in UC Medications - No data to display  Initial Impression / Assessment and Plan / UC Course  I have reviewed the triage vital signs and the nursing notes.  Pertinent labs & imaging results that were available during my care of the patient were reviewed by me and considered in my medical decision making (see chart for details).     Lungs clear, VSS, patient denies concern for pneumonia, do not suspect rib fracture with possible mechanism of injury day prior to onset, more likely muscular strain, chest  wall inflammation from straining with coughing. COVID PCR pending to rule out. Will treat symptomatically with anti- inflammatories, muscles relaxers, mucinex and flonase for congestion. Continue to monitor.   Discussed strict return precautions. Patient verbalized  understanding and is agreeable with plan.  Final Clinical Impressions(s) / UC Diagnoses   Final diagnoses:  Right-sided chest wall pain  Viral URI     Discharge Instructions     COVID swab pending Begin Flonase nasal spray 1 to 2 spray in each nostril daily, Mucinex DM twice daily for further drainage/cough  Use anti-inflammatories for pain/swelling. You may take up to 800 mg Ibuprofen every 8 hours with food. You may supplement Ibuprofen with Tylenol [phone] mg every 8 hours.  You may use flexeril as needed to help with pain. This is a muscle relaxer and causes sedation- please use only at bedtime or when you will be home and not have to drive/work  Please follow-up if developing increased cough, fevers, increased pain or shortness of breath   ED Prescriptions    Medication Sig Dispense Auth. Provider   cyclobenzaprine (FLEXERIL) 5 MG tablet Take 1-2 tablets (5-10 mg total) by mouth 2 (two) times daily as needed for muscle spasms. 24 tablet Nezzie Manera C, PA-C   fluticasone (FLONASE) 50 MCG/ACT nasal spray Place 1-2 sprays into both nostrils daily for 7 days. 1 g Atonya Templer C, PA-C   dextromethorphan-guaiFENesin (MUCINEX DM) 30-600 MG 12hr tablet Take 1 tablet by mouth 2 (two) times daily for 10 days. 20 tablet Onur Mori C, PA-C   ibuprofen (ADVIL) 600 MG tablet Take 1 tablet (600 mg total) by mouth every 6 (six) hours as needed. 30 tablet Tennile Styles, [place] C, PA-C     PDMP not reviewed this encounter.   Janith Lima, PA-C 10/02/19 1142    Debara Pickett C, PA-C 10/02/19 1156

## 2019-10-03 LAB — NOVEL CORONAVIRUS, NAA (HOSP ORDER, SEND-OUT TO REF LAB; TAT 18-24 HRS): SARS-CoV-2, NAA: NOT DETECTED

## 2019-10-08 ENCOUNTER — Encounter: Payer: Self-pay | Admitting: Family Medicine

## 2019-10-16 ENCOUNTER — Ambulatory Visit (INDEPENDENT_AMBULATORY_CARE_PROVIDER_SITE_OTHER): Payer: Medicare Other | Admitting: Gastroenterology

## 2019-10-16 ENCOUNTER — Encounter: Payer: Self-pay | Admitting: Gastroenterology

## 2019-10-16 ENCOUNTER — Other Ambulatory Visit: Payer: Self-pay

## 2019-10-16 VITALS — BP 148/62 | HR 96 | Temp 96.8°F | Ht 70.5 in | Wt 311.0 lb

## 2019-10-16 DIAGNOSIS — K580 Irritable bowel syndrome with diarrhea: Secondary | ICD-10-CM

## 2019-10-16 DIAGNOSIS — K649 Unspecified hemorrhoids: Secondary | ICD-10-CM

## 2019-10-16 DIAGNOSIS — K760 Fatty (change of) liver, not elsewhere classified: Secondary | ICD-10-CM

## 2019-10-16 DIAGNOSIS — E739 Lactose intolerance, unspecified: Secondary | ICD-10-CM | POA: Diagnosis not present

## 2019-10-16 NOTE — Patient Instructions (Signed)
Please take Fiber Choice 1-2 tablets daily with meals  Continue with lactose free diet  Continue Low carb diet   Follow up in 6 months  If you are age 66 or older, your body mass index should be between 23-30. Your Body mass index is 43.99 kg/m. If this is out of the aforementioned range listed, please consider follow up with your Primary Care Provider.  If you are age 37 or younger, your body mass index should be between 19-25. Your Body mass index is 43.99 kg/m. If this is out of the aformentioned range listed, please consider follow up with your Primary Care Provider.    I appreciate the  opportunity to care for you  Thank You   Harl Bowie , MD

## 2019-10-16 NOTE — Progress Notes (Signed)
Jermaine James    [id number]    [birth year]  Primary Care Physician:Fry, Ishmael Holter, MD  Referring Physician: Laurey Morale, MD [place],  [place] [id number]   Chief complaint:  Fatty liver, Hemorrhoids, IBS  HPI: 64 year old male with morbid obesity, diabetes, diverticulosis and internal hemorrhoids with intermittent bright red blood per rectum here for follow-up visit Colonoscopy 04/26/2018: 2 sessile polyps removed, diverticulosis and internal hemorrhoids Is overall doing better, no longer has rectal bleeding or discomfort Denies any nausea, vomiting, abdominal pain, melena or bright red blood per rectum   Outpatient Encounter Medications as of 10/16/2019  Medication Sig  . fenofibrate 160 MG tablet TAKE 1 TABLET (160 MG TOTAL) BY MOUTH DAILY.  . fluticasone (FLONASE) 50 MCG/ACT nasal spray Place 1-2 sprays into both nostrils daily for 7 days. (Patient taking differently: Place 1-2 sprays into both nostrils daily as needed. )  . lisinopril-hydrochlorothiazide (ZESTORETIC) 10-12.5 MG tablet TAKE 1 TABLET BY MOUTH DAILY.  . metFORMIN (GLUCOPHAGE) 500 MG tablet TAKE 1 TABLET (500 MG TOTAL) BY MOUTH 2 TIMES DAILY WITH A MEAL.  . [DISCONTINUED] cyclobenzaprine (FLEXERIL) 5 MG tablet Take 1-2 tablets (5-10 mg total) by mouth 2 (two) times daily as needed for muscle spasms.  . [DISCONTINUED] FENOFIBRATE PO Take by mouth.  . [DISCONTINUED] ibuprofen (ADVIL) 600 MG tablet Take 1 tablet (600 mg total) by mouth every 6 (six) hours as needed.  . [DISCONTINUED] LISINOPRIL PO Take by mouth.   No facility-administered encounter medications on file as of 10/16/2019.    Allergies as of 10/16/2019  . (No Known Allergies)    Past Medical History:  Diagnosis Date  . 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 Chronic kidney disease   . 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 Dental crowns present   . Family history of adverse reaction to anesthesia    pt's father has hx. of post-op N/V  . High triglycerides    no current med.  Marland Kitchen History of gout   . History of kidney stones   . Hypertension    states under control with med., has been on med. ~ 9 yr.  . Non-insulin dependent type 2 diabetes mellitus ([place])   . Obesity, Class III, BMI 40-49.9 (morbid obesity) ([place])   . OSA on CPAP   . Osteoarthritis    knees (09/14/2016)  . Sleep apnea   . 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 cancerous mole removed-back  09/2019  . COLONOSCOPY  08/29/2005   per Dr. Sharlett Iles, diverticulosis only, repeat in 10 yrs   . CYSTOSCOPY  2011   "related to trace blood in urine"  . EYE SURGERY Left 1961   traumatic loss of eye  . INGUINAL HERNIA REPAIR Left   . KNEE ARTHROSCOPY Left 1990s?  Marland Kitchen KNEE ARTHROSCOPY WITH MEDIAL MENISECTOMY Right 11/08/2012   Procedure: RIGHT KNEE ARTHROSCOPY WITH PARTIAL MEDIAL AND LATERAL MENISECTOMIES, CHONDROPLASTY;  Surgeon: [name], MD;  Location: [facility];  Service: Orthopedics;  Laterality: Right;  RIGHT KNEE SCOPE WITH PARTIAL MEDIAL AND LATERAL  MENISCECTOMIES  AND CHONDROPLASTY  . KNEE CLOSED REDUCTION Left 08/18/2016   Procedure: LEFT  MANIPULATION KNEE;  Surgeon: [name], MD;  Location: [facility];  Service: Orthopedics;  Laterality: Left;  .  LIPOMA EXCISION Left    upper back  . MOHS SURGERY Left 10/2015   "forehead"  . RETINAL TEAR REPAIR CRYOTHERAPY Right 11/2017  . SPINE SURGERY  02/07/2019  . TOTAL KNEE ARTHROPLASTY Left 05/18/2016   Procedure: LEFT TOTAL KNEE ARTHROPLASTY;  Surgeon: [name], MD;  Location: [facility];  Service: Orthopedics;  Laterality: Left;  . TOTAL KNEE ARTHROPLASTY Right 09/14/2016   Procedure: TOTAL KNEE ARTHROPLASTY MANIPULATION LEFT KNEE;  Surgeon: [name], MD;  Location: [facility];  Service: Orthopedics;  Laterality: Right;    Family History  Problem Relation  Age of Onset  . Anesthesia problems Father        post-op N/V  . Colon cancer Neg Hx   . Esophageal cancer Neg Hx   . Rectal cancer Neg Hx   . Stomach cancer Neg Hx     Social History   Socioeconomic History  . Marital status: Married    Spouse name: Not on file  . Number of children: Not on file  . Years of education: Not on file  . Highest education level: Not on file  Occupational History  . Occupation: Banker  Tobacco Use  . Smoking status: Never Smoker  . Smokeless tobacco: Never Used  Substance and Sexual Activity  . Alcohol use: Yes    Alcohol/week: 3.0 - 4.0 standard drinks    Types: 1 Glasses of wine, 1 Cans of beer, 1 - 2 Standard drinks or equivalent per week  . Drug use: No  . Sexual activity: Yes  Other Topics Concern  . Not on file  Social History Narrative  . Not on file   Social Determinants of Health   Financial Resource Strain:   . Difficulty of Paying Living Expenses: Not on file  Food Insecurity:   . Worried About Charity fundraiser in the Last Year: Not on file  . Ran Out of Food in the Last Year: Not on file  Transportation Needs:   . Lack of Transportation (Medical): Not on file  . Lack of Transportation (Non-Medical): Not on file  Physical Activity:   . Days of Exercise per Week: Not on file  . Minutes of Exercise per Session: Not on file  Stress:   . Feeling of Stress : Not on file  Social Connections:   . Frequency of Communication with Friends and Family: Not on file  . Frequency of Social Gatherings with Friends and Family: Not on file  . Attends Religious Services: Not on file  . Active Member of Clubs or Organizations: Not on file  . Attends Archivist Meetings: Not on file  . Marital Status: Not on file  Intimate Partner Violence:   . Fear of Current or Ex-Partner: Not on file  . Emotionally Abused: Not on file  . Physically Abused: Not on file  . Sexually Abused: Not on file      Review of systems:  All other  review of systems negative except as mentioned in the HPI.   Physical Exam: Vitals:   10/16/19 0931  BP: (!) 148/62  Pulse: 96  Temp: (!) 96.8 F (36 C)   Body mass index is 43.99 kg/m. 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 The patient agreed with the plan and demonstrated an understanding of the instructions.  Damaris Hippo , MD    CC: Laurey Morale, MD

## 2019-10-18 ENCOUNTER — Other Ambulatory Visit: Payer: Self-pay

## 2019-10-18 ENCOUNTER — Encounter: Payer: Self-pay | Admitting: Internal Medicine

## 2019-10-18 ENCOUNTER — Ambulatory Visit (INDEPENDENT_AMBULATORY_CARE_PROVIDER_SITE_OTHER): Payer: Medicare Other | Admitting: Internal Medicine

## 2019-10-18 VITALS — BP 120/70 | HR 102 | Ht 70.5 in | Wt 308.0 lb

## 2019-10-18 DIAGNOSIS — E1165 Type 2 diabetes mellitus with hyperglycemia: Secondary | ICD-10-CM | POA: Diagnosis not present

## 2019-10-18 LAB — POCT GLYCOSYLATED HEMOGLOBIN (HGB A1C): Hemoglobin A1C: 7.6 % — AB (ref 4.0–5.6)

## 2019-10-18 MED ORDER — METFORMIN HCL 1000 MG PO TABS: ORAL_TABLET | ORAL | 3 refills | Status: DC

## 2019-10-18 MED FILL — metFORMIN HCL 1000 MG TABS: 1000 | 90 days supply | Qty: 180 | Fill #0

## 2019-10-18 NOTE — Addendum Note (Signed)
Addended by: Cardell Peach I on: 10/18/2019 12:50 PM   Modules accepted: Orders

## 2019-10-18 NOTE — Progress Notes (Signed)
Patient ID: Jermaine Mercy., male   DOB: 03-10-1956, 64 y.o.   MRN: [id number]   This visit occurred during the SARS-CoV-2 public health emergency.  Safety protocols were in place, including screening questions prior to the visit, additional usage of staff PPE, and extensive cleaning of exam room while observing appropriate contact time as indicated for disinfecting solutions.   HPI: Jermaine Distad. is a 64 y.o.-year-old male, referred by his PCP, Dr. Sarajane Jews, for management of DM2, dx in 2012, non-insulin-dependent, uncontrolled, without long-term complications. He is the husband of Jermaine James, who is also my pt.  Reviewed latest HbA1c level: Lab Results  Component Value Date   HGBA1C 8.3 (H) 07/18/2019   HGBA1C 7.6 (H) 01/30/2019   HGBA1C 6.8 (H) 02/01/2018   HGBA1C 7.0 (H) 09/02/2016   HGBA1C 6.5 04/20/2016   HGBA1C 6.1 05/12/2015   HGBA1C 6.5 01/16/2014   HGBA1C 7.0 (H) 08/20/2012   HGBA1C 6.5 (H) 07/02/2012   HGBA1C 6.6 (H) 07/05/2011   HGBA1C 6.4 06/23/2010   HGBA1C 6.4 10/30/2009   HGBA1C 6.4 07/24/2009   HGBA1C 6.3 (H) 04/20/2007   Pt is on a regimen of: - Metformin 500 mg 2x a day, with meals - tolerating it well  Pt checks his sugars. - am: n/c (150-180 on labs) - 2h after b'fast: n/c - before lunch: n/c - 2h after lunch: n/c - before dinner: n/c - 2h after dinner: n/c - bedtime: n/c - nighttime: n/c Lowest sugar was 150. Highest sugar was 180.  Glucometer: none  Pt's meals are: - Breakfast: cheerios + 1-2% milk; pancake + waffle + bacon; OJ - Lunch: sandwich - PB/turkey/nan - Dinner: meat + starch + veggies - Snacks: 1-2x a day - evening: Orange; sherbert; chips He stopped icecream.  - no CKD, last BUN/creatinine:  Lab Results  Component Value Date   BUN 15 07/18/2019   BUN 12 05/17/2019   CREATININE 0.91 07/18/2019   CREATININE 0.90 05/17/2019  On lisinopril 10.  -+ HL; last set of lipids: Lab Results  Component Value Date   CHOL 173  02/01/2018   HDL 37.80 (L) 02/01/2018   LDLCALC 84 05/12/2015   LDLDIRECT 86.0 02/01/2018   TRIG 256.0 (H) 02/01/2018   CHOLHDL 5 02/01/2018  On fenofibrate 160.  - last eye exam was on 07/22/2019. No DR. Artificial eye OS.  Cataract OD.  - no numbness and tingling in his feet.  Pt has no FH of DM.  He also has a history of OSA - on CPAP, gout, HTN.  ROS: Constitutional: no weight gain, no weight loss, no fatigue, no subjective hyperthermia, no subjective hypothermia, no nocturia Eyes: + blurry vision, no xerophthalmia ENT: no sore throat, no nodules palpated in neck, no dysphagia, no odynophagia, no hoarseness, no tinnitus, no hypoacusis Cardiovascular: no CP, no SOB, no palpitations, + leg swelling Respiratory: + dry cough in am, no SOB, no wheezing Gastrointestinal: no N, no V, no D, no C, no acid reflux Musculoskeletal: no muscle, no joint aches Skin: no rash, no hair loss Neurological: no tremors, no numbness or tingling/no dizziness/no HAs Psychiatric: no depression, no anxiety + pbs with erections  Past Medical History:  Diagnosis Date  . 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 Chronic kidney disease   . 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 Dental crowns present   . Family history of adverse reaction to anesthesia    pt's father has  hx. of post-op N/V  . High triglycerides    no current med.  Marland Kitchen History of gout   . History of kidney stones   . Hypertension    states under control with med., has been on med. ~ 6 yr.  . Non-insulin dependent type 2 diabetes mellitus ([place])   . Obesity, Class III, BMI 40-49.9 (morbid obesity) ([place])   . OSA on CPAP   . Osteoarthritis    knees (09/14/2016)  . Sleep apnea   . 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 cancerous mole removed-back  09/2019  . COLONOSCOPY  08/29/2005   per Dr. Sharlett Iles, diverticulosis only, repeat in 10 yrs   .  CYSTOSCOPY  2011   "related to trace blood in urine"  . EYE SURGERY Left 1961   traumatic loss of eye  . INGUINAL HERNIA REPAIR Left   . KNEE ARTHROSCOPY Left 1990s?  Marland Kitchen KNEE ARTHROSCOPY WITH MEDIAL MENISECTOMY Right 11/08/2012   Procedure: RIGHT KNEE ARTHROSCOPY WITH PARTIAL MEDIAL AND LATERAL MENISECTOMIES, CHONDROPLASTY;  Surgeon: [name], MD;  Location: [place];  Service: Orthopedics;  Laterality: Right;  RIGHT KNEE SCOPE WITH PARTIAL MEDIAL AND LATERAL  MENISCECTOMIES  AND CHONDROPLASTY  . KNEE CLOSED REDUCTION Left 08/18/2016   Procedure: LEFT  MANIPULATION KNEE;  Surgeon: [name], MD;  Location: [place];  Service: Orthopedics;  Laterality: Left;  . LIPOMA EXCISION Left    upper back  . MOHS SURGERY Left 10/2015   "forehead"  . RETINAL TEAR REPAIR CRYOTHERAPY Right 11/2017  . SPINE SURGERY  02/07/2019  . TOTAL KNEE ARTHROPLASTY Left 05/18/2016   Procedure: LEFT TOTAL KNEE ARTHROPLASTY;  Surgeon: [name], MD;  Location: [place];  Service: Orthopedics;  Laterality: Left;  . TOTAL KNEE ARTHROPLASTY Right 09/14/2016   Procedure: TOTAL KNEE ARTHROPLASTY MANIPULATION LEFT KNEE;  Surgeon: [name], MD;  Location: [place];  Service: Orthopedics;  Laterality: Right;   Social History   Socioeconomic History  . Marital status: Married    Spouse name: Not on file  . Number of children: 3  . Years of education: Not on file  . Highest education level: Not on file  Occupational History  . Occupation: Customer service manager -retired  Tobacco Use  . Smoking status: Never Smoker  . Smokeless tobacco: Never Used  Substance and Sexual Activity  . Alcohol use: Yes    Alcohol/week: 3.0 - 4.0 standard drinks    Types: Beer, wine, mixed  . Drug use: No  . Sexual activity: Yes  Other Topics Concern  . Not on file  Social History Narrative  . Not on file   Social Determinants of Health   Financial Resource Strain:   . Difficulty of Paying Living  Expenses: Not on file  Food Insecurity:   . Worried About Charity fundraiser in the Last Year: Not on file  . Ran Out of Food in the Last Year: Not on file  Transportation Needs:   . Lack of Transportation (Medical): Not on file  . Lack of Transportation (Non-Medical): Not on file  Physical Activity:   . Days of Exercise per Week: Not on file  . Minutes of Exercise per Session: Not on file  Stress:   . Feeling of Stress : Not on file  Social Connections:   . Frequency of Communication with Friends and Family: Not on file  .  Frequency of Social Gatherings with Friends and Family: Not on file  . Attends Religious Services: Not on file  . Active Member of Clubs or Organizations: Not on file  . Attends Archivist Meetings: Not on file  . Marital Status: Not on file  Intimate Partner Violence:   . Fear of Current or Ex-Partner: Not on file  . Emotionally Abused: Not on file  . Physically Abused: Not on file  . Sexually Abused: Not on file   Current Outpatient Medications on File Prior to Visit  Medication Sig Dispense Refill  . fluticasone (FLONASE) 50 MCG/ACT nasal spray Place 1-2 sprays into both nostrils daily for 7 days. (Patient taking differently: Place 1-2 sprays into both nostrils daily as needed. ) 1 g 0  . lisinopril-hydrochlorothiazide (ZESTORETIC) 10-12.5 MG tablet TAKE 1 TABLET BY MOUTH DAILY. 90 tablet 0  . metFORMIN (GLUCOPHAGE) 500 MG tablet TAKE 1 TABLET (500 MG TOTAL) BY MOUTH 2 TIMES DAILY WITH A MEAL. 60 tablet 0  . fenofibrate 160 MG tablet TAKE 1 TABLET (160 MG TOTAL) BY MOUTH DAILY. 90 tablet 3  . [DISCONTINUED] FENOFIBRATE PO Take by mouth.     No current facility-administered medications on file prior to visit.   No Known Allergies Family History  Problem Relation Age of Onset  . Anesthesia problems Father        post-op N/V  . Colon cancer Neg Hx   . Esophageal cancer Neg Hx   . Rectal cancer Neg Hx   . Stomach cancer Neg Hx     PE: BP  120/70   Pulse (!) 102   Ht 5' 10.5" (1.791 m)   Wt (!) 308 lb (139.7 kg)   SpO2 98%   BMI 43.57 kg/m  Wt Readings from Last 3 Encounters:  10/18/19 (!) 308 lb (139.7 kg)  10/16/19 (!) 311 lb (141.1 kg)  07/18/19 (!) 313 lb (142 kg)   Constitutional: obese, in NAD Eyes: PERRLA, EOMI, no exophthalmos ENT: moist mucous membranes, no thyromegaly, no cervical lymphadenopathy Cardiovascular: RRR, No MRG Respiratory: CTA B Gastrointestinal: abdomen soft, NT, ND, BS+ Musculoskeletal: no deformities, strength intact in all 4 Skin: moist, warm, no rashes Neurological: no tremor with outstretched hands, DTR normal in all 4  ASSESSMENT: 1. DM2, non-insulin-dependent, uncontrolled, without long-term complications, but with hyperglycemia  PLAN:  1. Patient with long-standing, uncontrolled diabetes, on oral antidiabetic regimen with half maximal Metformin dose, which became insufficient.  Latest HbA1c was higher, at 8.3%, increased from 7.6%.  However, at this visit, HbA1c decreased again to 7.6%. -He is not checking sugars and I strongly advised him to start.  I advised him to start checking once a day rotating check times. -We reviewed his diet today and is not ideal.  We discussed about  healthy changes in his diet and he also accepts a referral to nutrition.  I also strongly advised him to stop drinking juice, and eliminating sherbert, chips, or other sugary or fatty snacks. -In the meantime, we will also increase his Metformin to the target dose of 1000 mg twice a day, since he tolerates this well. -For now, I feel that this is enough that we may need to escalate his regimen at next visit, if sugars do not improve -I suggested to: Patient Instructions  Please stop JUICE.  Please schedule an appt with Antonieta Iba with nutrition.  Please increase: - Metformin to 1000 mg 2x a day with meals  Please return in 3 months with your  sugar log.   - discussed about CBG targets for treatment:  80-130 mg/dL before meals and <180 mg/dL after meals; target HbA1c <7%. - given sugar log and advised how to fill it and to bring it at next appt  - given foot care handout and explained the principles  - given instructions for hypoglycemia management "15-15 rule"  - advised for yearly eye exams  - Return to clinic in 3 mo with sugar log   Philemon Kingdom, MD PhD [facility] Endocrinology

## 2019-10-18 NOTE — Patient Instructions (Addendum)
Please stop JUICE.  Please schedule an appt with Antonieta Iba with nutrition.  Please increase: - Metformin to 1000 mg 2x a day with meals  Please return in 3 months with your sugar log.   PATIENT INSTRUCTIONS FOR TYPE 2 DIABETES:  DIET AND EXERCISE Diet and exercise is an important part of diabetic treatment.  We recommended aerobic exercise in the form of brisk walking (working between 40-60% of maximal aerobic capacity, similar to brisk walking) for 150 minutes per week (such as 30 minutes five days per week) along with 3 times per week performing 'resistance' training (using various gauge rubber tubes with handles) 5-10 exercises involving the major muscle groups (upper body, lower body and core) performing 10-15 repetitions (or near fatigue) each exercise. Start at half the above goal but build slowly to reach the above goals. If limited by weight, joint pain, or disability, we recommend daily walking in a swimming pool with water up to waist to reduce pressure from joints while allow for adequate exercise.    BLOOD GLUCOSES Monitoring your blood glucoses is important for continued management of your diabetes. Please check your blood glucoses 2-4 times a day: fasting, before meals and at bedtime (you can rotate these measurements - e.g. one day check before the 3 meals, the next day check before 2 of the meals and before bedtime, etc.).   HYPOGLYCEMIA (low blood sugar) Hypoglycemia is usually a reaction to not eating, exercising, or taking too much insulin/ other diabetes drugs.  Symptoms include tremors, sweating, hunger, confusion, headache, etc. Treat IMMEDIATELY with 15 grams of Carbs: . 4 glucose tablets .  cup regular juice/soda . 2 tablespoons raisins . 4 teaspoons sugar . 1 tablespoon honey Recheck blood glucose in 15 mins and repeat above if still symptomatic/blood glucose <100.  RECOMMENDATIONS TO REDUCE YOUR RISK OF DIABETIC COMPLICATIONS: * Take your prescribed  MEDICATION(S) * Follow a DIABETIC diet: Complex carbs, fiber rich foods, (monounsaturated and polyunsaturated) fats * AVOID saturated/trans fats, high fat foods, >2,300 mg salt per day. * EXERCISE at least 5 times a week for 30 minutes or preferably daily.  * DO NOT SMOKE OR DRINK more than 1 drink a day. * Check your FEET every day. Do not wear tightfitting shoes. Contact us if you develop an ulcer * See your EYE doctor once a year or more if needed * Get a FLU shot once a year * Get a PNEUMONIA vaccine once before and once after age 74 years  GOALS:  * Your Hemoglobin A1c of <7%  * fasting sugars need to be <130 * after meals sugars need to be <180 (2h after you start eating) * Your Systolic BP should be [id number] or lower  * Your Diastolic BP should be 80 or lower  * Your HDL (Good Cholesterol) should be 40 or higher  * Your LDL (Bad Cholesterol) should be 100 or lower. * Your Triglycerides should be 150 or lower  * Your Urine microalbumin (kidney function) should be <30 * Your Body Mass Index should be 25 or lower   Please consider the following ways to cut down carbs and fat and increase fiber and micronutrients in your diet: - substitute whole grain for white bread or pasta - substitute brown rice for white rice - substitute 90-calorie flat bread pieces for slices of bread when possible - substitute sweet potatoes or yams for white potatoes - substitute humus for margarine - substitute tofu for cheese when possible - substitute almond or  rice milk for regular milk (would not drink soy milk daily due to concern for soy estrogen influence on breast cancer risk) - substitute dark chocolate for other sweets when possible - substitute water - can add lemon or orange slices for taste - for diet sodas (artificial sweeteners will trick your body that you can eat sweets without getting calories and will lead you to overeating and weight gain in the long run) - do not skip breakfast or other  meals (this will slow down the metabolism and will result in more weight gain over time)  - can try smoothies made from fruit and almond/rice milk in am instead of regular breakfast - can also try old-fashioned (not instant) oatmeal made with almond/rice milk in am - order the dressing on the side when eating salad at a restaurant (pour less than half of the dressing on the salad) - eat as little meat as possible - can try juicing, but should not forget that juicing will get rid of the fiber, so would alternate with eating raw veg./fruits or drinking smoothies - use as little oil as possible, even when using olive oil - can dress a salad with a mix of balsamic vinegar and lemon juice, for e.g. - use agave nectar, stevia sugar, or regular sugar rather than artificial sweateners - steam or broil/roast veggies  - snack on veggies/fruit/nuts (unsalted, preferably) when possible, rather than processed foods - reduce or eliminate aspartame in diet (it is in diet sodas, chewing gum, etc) Read the labels!  Try to read Dr. Janene Harvey book: "Program for Reversing Diabetes" for other ideas for healthy eating.

## 2019-10-21 ENCOUNTER — Telehealth: Payer: Self-pay | Admitting: Primary Care

## 2019-10-21 ENCOUNTER — Encounter: Payer: Self-pay | Admitting: Gastroenterology

## 2019-10-21 ENCOUNTER — Encounter: Payer: Self-pay | Admitting: Family Medicine

## 2019-10-21 DIAGNOSIS — G4733 Obstructive sleep apnea (adult) (pediatric): Secondary | ICD-10-CM

## 2019-10-21 MED FILL — DOXYCYCLINE HYCLATE 100 MG: 100 | 14 days supply | Qty: 28 | Fill #0

## 2019-10-21 MED FILL — DOXYCYCLINE HYCLATE 100 MG: 100 | 14 days supply | Qty: 28 | Fill #0 | Status: TO

## 2019-10-21 NOTE — Telephone Encounter (Signed)
Called and spoke to pt's wife. Advised her that we will need to speak with [facility] to see if they need a new order or a signed CMN. Pt last seen in 2019, appt made with VS for 11/2019. Pt's wife verbalized understanding. Will call [facility] on 3/9 as they are currently closed right now.

## 2019-10-22 NOTE — Telephone Encounter (Signed)
He likely has a viral infection of some sort, but I would recommend he take the Doxycycline just to be sure. I would also recommend he get a Covid test. Take Tylenol and drink fluids for the symptoms

## 2019-10-23 NOTE — Telephone Encounter (Signed)
Spoke with [facility]. The pt just needs a new order for CPAP supplies. This has been taken care of as the pt has a pending OV next month. Nothing further was needed.

## 2019-11-12 ENCOUNTER — Other Ambulatory Visit: Payer: Self-pay | Admitting: Family Medicine

## 2019-11-12 MED FILL — FENOFIBRATE 160 MG TABLET: 160 | 90 days supply | Qty: 90 | Fill #3

## 2019-11-12 MED FILL — LISINOPRIL-HCTZ 10-12.5 MG: 10-12.5 | 90 days supply | Qty: 90 | Fill #0

## 2019-11-21 ENCOUNTER — Encounter: Payer: Medicare Other | Attending: Internal Medicine | Admitting: Dietician

## 2019-11-21 ENCOUNTER — Other Ambulatory Visit: Payer: Self-pay

## 2019-11-21 ENCOUNTER — Encounter: Payer: Self-pay | Admitting: Dietician

## 2019-11-21 DIAGNOSIS — E119 Type 2 diabetes mellitus without complications: Secondary | ICD-10-CM | POA: Diagnosis present

## 2019-11-21 NOTE — Patient Instructions (Signed)
Plan:  Aim for 3-4 Carb Choices per meal (45-60 grams) +/- 1 either way  Aim for 0-1 Carbs per snack if hungry  Include protein in moderation with your meals and snacks Consider reading food labels for Total Carbohydrate of foods Consider  increasing your activity level by walking for 30 minutes daily as tolerated Continue checking BG at alternate times per day  Continue taking medication as directed by MD   Doristine Devoid job on the changes that you made!  Decreasing juice and regular soda (continue to choose beverages without carbohydrates most)  Staying active  Being mindful of portion sizes and meal choices

## 2019-11-21 NOTE — Progress Notes (Signed)
Medical Nutrition Therapy:  Appt start time:  1500 end time:  1600.   Assessment:  Primary concerns today: Patient is here today alone.  He reports being diagnosed with diabetes since about 2012.  He would like to continue to lose weight and lower his blood sugar.  He was last seen by RD in 2012, and CDE in 2016.  History includes Type 2 Diabetes, HTN, HLD, OSA on C-pap, blind in L eye due to childhood accident.   He checks blood sugar once per day and rotates this.  121 fasting this am and 150-160 post meal.  Highest was 190 prior to dinner last week.  He states that he sees his blood sugar drop about 40 points per week.  He is graphing his reading.  Weight:  302 lbs 11/21/2019  311 lbs 10/16/2019  No cake, pie, cookies sweets and little ice cream and little regular soda in a month due to being more aware of these items on his blood sugar.  This has resulted in a 9 lb weight loss in the past month. He followed a weight loss program about 5 years ago which helped with portion control and mindfulness currently.  At that time, he lost 27 lbs, kept this off for 1 1/2 years and then slowly regained this.  He did not follow any strict guidelines for type 2 diabetes until about 3 weeks ago.    Patient is living with his wife.  They share shopping and cooking.  He is retired from [employer].  Preferred Learning Style:   No preference indicated   Learning Readiness:   Ready  Change in progress   MEDICATIONS: see list to include Metformin   DIETARY INTAKE:  Usual eating pattern includes 3 meals and 1 snacks per day.  24-hr recall:  B (10:30 AM): English Muffin, fried egg, 3 strips bacon today OR English Muffin, PB, occasional 3-4 oz OJ OR cheerios and 1% milk  Snk ( AM): none  L (2 PM): sandwich (lean roast beef, on rye with 1 T mayo), tortilla chips Snk ( PM): occasional orange or apple D ( PM): meat (steak or chicken or seafood), potatoes, vegetable, occasional side salad Snk ( PM): orange,  apple Beverages: water, unsweetened iced tea, 3-4 oz OJ occasionally rare regular soda (7 oz can), rare beer  Usual physical activity: walking 4 days per week for about 35 minutes  Back surgery 01/2019 and has been more cautious about exercise since that time.  He injured his back doing toe touches.  Progress Towards Goal(s):  In progress.   Nutritional Diagnosis:  NB-1.1 Food and nutrition-related knowledge deficit As related to balance of carbohydrates, protein, and fat.  As evidenced by diet hx.    Intervention:  Nutrition counseling and diabetes education initiated. Discussed Carb Counting by food group as method of portion control, reading food labels, and benefits of increased activity. Also discussed basic physiology of Diabetes, target BG ranges pre and post meals, and A1c.   Plan:  Aim for 3-4 Carb Choices per meal (45-60 grams) +/- 1 either way  Aim for 0-1 Carbs per snack if hungry  Include protein in moderation with your meals and snacks Consider reading food labels for Total Carbohydrate of foods Consider  increasing your activity level by walking for 30 minutes daily as tolerated Continue checking BG at alternate times per day  Continue taking medication as directed by MD   Doristine Devoid job on the changes that you made!  Decreasing juice and  regular soda (continue to choose beverages without carbohydrates most)  Staying active  Being mindful of portion sizes and meal choices   Teaching Method Utilized:  Visual Auditory Hands on  Handouts given during visit include: How to Thrive:  A Guide for Your Journey with Diabetes by the ADA Meal Plan Card  Snack sheet  Barriers to learning/adherence to lifestyle change: none  Demonstrated degree of understanding via:  Teach Back   Monitoring/Evaluation:  Dietary intake, exercise, and body weight prn.

## 2019-11-22 ENCOUNTER — Other Ambulatory Visit: Payer: Self-pay | Admitting: Family Medicine

## 2019-11-22 ENCOUNTER — Encounter: Payer: Self-pay | Admitting: Family Medicine

## 2019-11-22 ENCOUNTER — Ambulatory Visit (INDEPENDENT_AMBULATORY_CARE_PROVIDER_SITE_OTHER): Payer: Medicare Other | Admitting: Family Medicine

## 2019-11-22 VITALS — BP 148/66 | HR 108 | Temp 97.2°F | Wt 300.8 lb

## 2019-11-22 DIAGNOSIS — N401 Enlarged prostate with lower urinary tract symptoms: Secondary | ICD-10-CM | POA: Diagnosis not present

## 2019-11-22 DIAGNOSIS — E785 Hyperlipidemia, unspecified: Secondary | ICD-10-CM

## 2019-11-22 DIAGNOSIS — E119 Type 2 diabetes mellitus without complications: Secondary | ICD-10-CM

## 2019-11-22 DIAGNOSIS — N138 Other obstructive and reflux uropathy: Secondary | ICD-10-CM

## 2019-11-22 DIAGNOSIS — I10 Essential (primary) hypertension: Secondary | ICD-10-CM | POA: Diagnosis not present

## 2019-11-22 MED ORDER — SILDENAFIL CITRATE 100 MG PO TABS: 100.0000 mg | ORAL_TABLET | ORAL | 11 refills | Status: DC | PRN

## 2019-11-22 MED ORDER — LISINOPRIL 20 MG PO TABS: 20.0000 mg | ORAL_TABLET | Freq: Every day | ORAL | 3 refills | Status: DC

## 2019-11-22 NOTE — Progress Notes (Signed)
Subjective:    Patient ID: Jermaine James., male    DOB: [birth year], 64 y.o.   MRN: [id number]  HPI Here to follow up on several issues. He feels well in general. He is seeing Dr. Cruzita Lederer for his diabetes and he has been able to drop his A1c from 8.3 in December to 7.6 last month. He has lost 13 lbs since December. He has met with Nutrition and has made substantial changes to his diet. He walks 2 miles 3-4 days a week. He had some lab work done in December showing normal kidney function. His BP has been running slightly high with systolic readings in the 102H.   Review of Systems  Constitutional: Negative.   Respiratory: Negative.   Cardiovascular: Negative.   Gastrointestinal: Negative.   Genitourinary: Negative.   Neurological: Negative.        Objective:   Physical Exam Constitutional:      Appearance: Normal appearance.  Cardiovascular:     Rate and Rhythm: Normal rate and regular rhythm.     Pulses: Normal pulses.     Heart sounds: Normal heart sounds.  Pulmonary:     Effort: Pulmonary effort is normal.     Breath sounds: Normal breath sounds.  Musculoskeletal:     Right lower leg: No edema.     Left lower leg: No edema.  Neurological:     Mental Status: He is alert.           Assessment & Plan:  His diabetes is slowly improving and I encouraged him to stick with the plan. For the HTN we will stop the HCTZ and increase the Lisinopril to 20 mg daily. He will return next week for fasting labs to check TSH, PSA, and a lipid panel.  Alysia Penna, MD

## 2019-11-25 MED FILL — LISINOPRIL 20 MG TABLET: 20 | 90 days supply | Qty: 90 | Fill #0

## 2019-11-27 ENCOUNTER — Other Ambulatory Visit: Payer: Self-pay

## 2019-11-28 ENCOUNTER — Other Ambulatory Visit (INDEPENDENT_AMBULATORY_CARE_PROVIDER_SITE_OTHER): Payer: Medicare Other

## 2019-11-28 DIAGNOSIS — N401 Enlarged prostate with lower urinary tract symptoms: Secondary | ICD-10-CM

## 2019-11-28 DIAGNOSIS — N138 Other obstructive and reflux uropathy: Secondary | ICD-10-CM | POA: Diagnosis not present

## 2019-11-28 DIAGNOSIS — I10 Essential (primary) hypertension: Secondary | ICD-10-CM

## 2019-11-28 LAB — LIPID PANEL
Cholesterol: 110 mg/dL (ref 0–200)
HDL: 30.2 mg/dL — ABNORMAL LOW (ref >39.00)
LDL Cholesterol: 54 mg/dL (ref 0–99)
NonHDL: 80.16
Total CHOL/HDL Ratio: 4
Triglycerides: 130 mg/dL (ref 0.0–149.0)
VLDL: 26 mg/dL (ref 0.0–40.0)

## 2019-11-28 LAB — TSH: TSH: 2.08 u[IU]/mL (ref 0.35–4.50)

## 2019-11-28 LAB — PSA: PSA: 0.16 ng/mL (ref 0.10–4.00)

## 2019-12-04 ENCOUNTER — Other Ambulatory Visit: Payer: Self-pay

## 2019-12-04 ENCOUNTER — Encounter: Payer: Self-pay | Admitting: Pulmonary Disease

## 2019-12-04 ENCOUNTER — Ambulatory Visit (INDEPENDENT_AMBULATORY_CARE_PROVIDER_SITE_OTHER): Payer: Medicare Other | Admitting: Internal Medicine

## 2019-12-04 ENCOUNTER — Ambulatory Visit (INDEPENDENT_AMBULATORY_CARE_PROVIDER_SITE_OTHER): Payer: Medicare Other | Admitting: Pulmonary Disease

## 2019-12-04 ENCOUNTER — Encounter: Payer: Self-pay | Admitting: Internal Medicine

## 2019-12-04 VITALS — BP 124/80 | HR 91 | Temp 98.4°F | Ht 71.0 in | Wt 301.0 lb

## 2019-12-04 VITALS — BP 150/82 | HR 96 | Temp 98.2°F | Ht 71.0 in | Wt 304.8 lb

## 2019-12-04 DIAGNOSIS — G4733 Obstructive sleep apnea (adult) (pediatric): Secondary | ICD-10-CM

## 2019-12-04 DIAGNOSIS — E669 Obesity, unspecified: Secondary | ICD-10-CM

## 2019-12-04 DIAGNOSIS — Z87442 Personal history of urinary calculi: Secondary | ICD-10-CM

## 2019-12-04 DIAGNOSIS — R350 Frequency of micturition: Secondary | ICD-10-CM

## 2019-12-04 DIAGNOSIS — G4730 Sleep apnea, unspecified: Secondary | ICD-10-CM

## 2019-12-04 DIAGNOSIS — R109 Unspecified abdominal pain: Secondary | ICD-10-CM | POA: Diagnosis not present

## 2019-12-04 LAB — POCT URINALYSIS DIPSTICK
Bilirubin, UA: NEGATIVE
Blood, UA: NEGATIVE
Glucose, UA: NEGATIVE
Ketones, UA: NEGATIVE
Leukocytes, UA: NEGATIVE
Nitrite, UA: NEGATIVE
Protein, UA: POSITIVE — AB
Spec Grav, UA: 1.015 (ref 1.010–1.025)
Urobilinogen, UA: 0.2 E.U./dL
pH, UA: 7.5 (ref 5.0–8.0)

## 2019-12-04 MED ORDER — CYCLOBENZAPRINE HCL 10 MG PO TABS: 10.0000 mg | ORAL_TABLET | Freq: Three times a day (TID) | ORAL | 0 refills | Status: DC | PRN

## 2019-12-04 MED FILL — CYCLOBENZAPRINE HCL 10 MG T: 10 | 8 days supply | Qty: 24 | Fill #0

## 2019-12-04 NOTE — Progress Notes (Signed)
This visit occurred during the SARS-CoV-2 public health emergency.  Safety protocols were in place, including screening questions prior to the visit, additional usage of staff PPE, and extensive cleaning of exam room while observing appropriate contact time as indicated for disinfecting solutions.    Chief Complaint  Patient presents with  . Flank Pain    Left back pain radiating into left groin area  . Urinary Frequency    HPI: J[facility]. 64 y.o. come in for ZSDA  pcp NA  For 4 -5 days  Of sx   Left low back pain radiating around to left groin and test area   No hematuria fever chills  Did Picked up lawnmower  Before this .   Never had before  But had renal stone left passed on own in about 11-09-2014 , Took left over flexeril and helped some  Pain in am 1/10 and progresses over day in  To 4/10 later in day  Has dm getting better control  ROS: See pertinent positives and negatives per HPI.  Past Medical History:  Diagnosis Date  . 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 Chronic kidney disease   . 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 Dental crowns present   . Family history of adverse reaction to anesthesia    pt's father has hx. of post-op N/V  . High triglycerides    no current med.  Marland Kitchen History of gout   . History of kidney stones   . Hypertension    states under control with med., has been on med. ~ 67 yr.  . Non-insulin dependent type 2 diabetes mellitus ([place])   . Obesity, Class III, BMI 40-49.9 (morbid obesity) ([place])   . OSA on CPAP   . Osteoarthritis    knees (09/14/2016)  . Sleep apnea   . 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 Anesthesia problems Father        post-op N/V  . Colon cancer Neg Hx   . Esophageal cancer Neg Hx   . Rectal cancer Neg Hx   . Stomach cancer Neg Hx     Social History   Socioeconomic History  . Marital status: Married    Spouse name: Not on  file  . Number of children: Not on file  . Years of education: Not on file  . Highest education level: Not on file  Occupational History  . Occupation: Banker  Tobacco Use  . Smoking status: Never Smoker  . Smokeless tobacco: Never Used  Substance and Sexual Activity  . Alcohol use: Yes    Alcohol/week: 3.0 - 4.0 standard drinks    Types: 1 Glasses of wine, 1 Cans of beer, 1 - 2 Standard drinks or equivalent per week  . Drug use: No  . Sexual activity: Yes  Other Topics Concern  . Not on file  Social History Narrative  . Not on file   Social Determinants of Health   Financial Resource Strain:   . Difficulty of Paying Living Expenses:   Food Insecurity:   . Worried About Charity fundraiser in the Last Year:   . Arboriculturist in the Last Year:   Transportation Needs:   . Film/video editor (Medical):   Marland Kitchen Lack of Transportation (Non-Medical):   Physical Activity:   . Days of Exercise per  Week:   . Minutes of Exercise per Session:   Stress:   . Feeling of Stress :   Social Connections:   . Frequency of Communication with Friends and Family:   . Frequency of Social Gatherings with Friends and Family:   . Attends Religious Services:   . Active Member of Clubs or Organizations:   . Attends Archivist Meetings:   Marland Kitchen Marital Status:     Outpatient Medications Prior to Visit  Medication Sig Dispense Refill  . fenofibrate 160 MG tablet TAKE 1 TABLET (160 MG TOTAL) BY MOUTH DAILY. 90 tablet 3  . fluticasone (FLONASE) 50 MCG/ACT nasal spray Place 1-2 sprays into both nostrils daily for 7 days. (Patient taking differently: Place 1-2 sprays into both nostrils daily as needed. ) 1 g 0  . lisinopril (ZESTRIL) 20 MG tablet Take 1 tablet (20 mg total) by mouth daily. 90 tablet 3  . metFORMIN (GLUCOPHAGE) 1000 MG tablet TAKE 1 TABLET (1000 MG TOTAL) BY MOUTH 2 TIMES DAILY WITH A MEAL. 180 tablet 3  . sildenafil (VIAGRA) 100 MG tablet Take 1 tablet (100 mg total) by mouth  as needed for erectile dysfunction. 10 tablet 11   No facility-administered medications prior to visit.     EXAM:  BP (!) 150/82   Pulse 96   Temp 98.2 F (36.8 C) (Temporal)   Ht 5\' 11"  (1.803 m)   Wt (!) 304 lb 12.8 oz (138.3 kg)   SpO2 98%   BMI 42.51 kg/m   Body mass index is 42.51 kg/m.  GENERAL: vitals reviewed and listed above, alert, oriented, appears well hydrated and in no acute distress HEENT: atraumatic, conjunctiva  clear, no obvious abnormalities on inspection of external nose and ears OP : masked  NECK: no obvious masses on inspection palpation  LUNGS: clear to auscultation bilaterally, no wheezes, rales or rhonchi, good air movement CV: HRRR, no clubbing cyanosis or  peripheral edema nl cap refill  abd large no focal tenderness  Right lower flank pain  Groin no obv masses   No obv henria Redness swelling  some tenderness  MS: moves all extremities without noticeable focal  abnormality PSYCH: pleasant and cooperative, no obvious depression or anxiety Lab Results  Component Value Date   WBC 5.6 07/18/2019   HGB 13.7 07/18/2019   HCT 41.2 07/18/2019   PLT 289.0 07/18/2019   GLUCOSE 206 (H) 07/18/2019   CHOL 110 11/28/2019   TRIG 130.0 11/28/2019   HDL 30.20 (L) 11/28/2019   LDLDIRECT 86.0 02/01/2018   LDLCALC 54 11/28/2019   ALT 28 07/18/2019   AST 25 07/18/2019   NA 138 07/18/2019   K 4.1 07/18/2019   CL 101 07/18/2019   CREATININE 0.91 07/18/2019   BUN 15 07/18/2019   CO2 28 07/18/2019   TSH 2.08 11/28/2019   PSA 0.16 11/28/2019   INR 0.98 05/06/2016   HGBA1C 7.6 (A) 10/18/2019   MICROALBUR <0.7 05/12/2015   BP Readings from Last 3 Encounters:  12/04/19 (!) 150/82  12/04/19 124/80  11/22/19 (!) 148/66   Urinalysis    Component Value Date/Time   COLORURINE YELLOW 05/06/2016 1047   APPEARANCEUR CLEAR 05/06/2016 1047   LABSPEC 1.015 05/06/2016 1047   PHURINE 7.0 05/06/2016 1047   GLUCOSEU NEGATIVE 05/06/2016 1047   GLUCOSEU NEGATIVE  10/21/2009 0810   HGBUR NEGATIVE 05/06/2016 1047   HGBUR trace-lysed 07/28/2009 0809   BILIRUBINUR Negative 12/04/2019 1512   KETONESUR NEGATIVE 05/06/2016 1047   PROTEINUR Positive (A)  12/04/2019 1512   PROTEINUR NEGATIVE 05/06/2016 1047   UROBILINOGEN 0.2 12/04/2019 1512   UROBILINOGEN 0.2 09/18/2014 2153   NITRITE Negative 12/04/2019 1512   NITRITE NEGATIVE 05/06/2016 1047   LEUKOCYTESUR Negative 12/04/2019 1512     ASSESSMENT AND PLAN:  Discussed the following assessment and plan:  Left flank discomfort - radiating to lowr abd groin scrotoal area with other nl exam  Frequent urination - Plan: POCT urinalysis dipstick  History of renal stone  Context seems to be MS cause  After lifting lawn lover . Remote hs of renal stones left   2016  But passed this as per patient  Radiation reminiscent of renal cause  Otherwise  No sx of infection today  . rx as ms cause observe time low threshold to do  Renal ct  If  persistent or progressive etc  Has fu with dr Darnell Level for dm  Soon doing better  -Patient advised to return or notify health care team  if  new concerns arise.  Patient Instructions  Seems to be  a muscle skeletal strain  At this time  Based on context and location . However if  Severe  Or  Progression  medical team and  consider getting  Ct of  Kidneys .  No blood in urine today. Expect better in a few weeks     Mariann Laster K. Sukhman Martine M.D.

## 2019-12-04 NOTE — Patient Instructions (Signed)
Seems to be  a muscle skeletal strain  At this time  Based on context and location . However if  Severe  Or  Progression  medical team and  consider getting  Ct of  Kidneys .  No blood in urine today. Expect better in a few weeks

## 2019-12-04 NOTE — Patient Instructions (Signed)
Will arrange for new CPAP supplies through [facility]  Follow up in 1 year

## 2019-12-04 NOTE — Progress Notes (Signed)
[facility]  Chief Complaint  Patient presents with  . Follow-up    F/U for CPAP machine. Switched insurance to [employer] and needed a visit for compliance and supplies.     Constitutional:  BP 124/80   Pulse 91   Temp 98.4 F (36.9 C) (Temporal)   Ht 5\' 11"  (1.803 m)   Wt (!) 301 lb (136.5 kg)   SpO2 97% Comment: on RA  BMI 41.98 kg/m   Past Medical History:  Blind Lt eye, CKD, HLD, Gout, HTN, DM, OA  Summary:  Jermaine [facility]. is a 64 y.o. male with obstructive sleep apnea.  Subjective:   He has been doing well with CPAP.  Uses nightly.  Full face mask.  No issues with mask fit.  Denies sinus congestion, sore throat, dry mouth, or aerophagia.  Working with his endocrinologist about his diabetes.  Checking blood sugars more, and this has made him more aware of what he is eating.  Has lost about 15 lbs in last 1.5 months.   Physical Exam:   Appearance - well kempt  ENMT - no sinus tenderness, no nasal discharge, no oral exudate, Mallampati 4  Respiratory - no wheeze, or rales  CV - regular rate and rhythm, no murmurs  GI - soft, non tender  Lymph - no adenopathy noted in neck  Ext - no edema  Skin - no rashes  Neuro - normal strength, oriented x 3  Psych - normal mood and affect   Assessment/Plan:   Obstructive sleep apnea. - he is compliant with CPAP and reports benefit - continue CPAP 14 cm H2O  Obesity. - encouraged him to keep up progress with his weight loss efforts - if he continues to lose weight, then might be able to revisit settings on CPAP machine  A total of  22 minutes spent addressing patient care issues on day of visit.   Follow up:  Patient Instructions  Will arrange for new CPAP supplies through [facility]  Follow up in 1 year   Signature:  Chesley Mires, MD [place] Pager: [phone] 12/04/2019, 11:50 AM  Flow Sheet    Sleep tests:  PSG 07/22/05 >> AHI  101, SpO2 low 69% CPAP 11/02/19 to 12/01/19 >> used on 30 of 30 nights with average 8 hrs 29 min.  Average AHI 0.8 with CPAP 14 cm H2O.  Medications:   Allergies as of 12/04/2019   No Known Allergies     Medication List       Accurate as of December 04, 2019 11:50 AM. If you have any questions, ask your nurse or doctor.        fenofibrate 160 MG tablet TAKE 1 TABLET (160 MG TOTAL) BY MOUTH DAILY.   fluticasone 50 MCG/ACT nasal spray Commonly known as: FLONASE Place 1-2 sprays into both nostrils daily for 7 days. What changed:   when to take this  reasons to take this   lisinopril 20 MG tablet Commonly known as: ZESTRIL Take 1 tablet (20 mg total) by mouth daily.   metFORMIN 1000 MG tablet Commonly known as: GLUCOPHAGE TAKE 1 TABLET (1000 MG TOTAL) BY MOUTH 2 TIMES DAILY WITH A MEAL.   sildenafil 100 MG tablet Commonly known as: Viagra Take 1 tablet (100 mg total) by mouth as needed for erectile dysfunction.       Past Surgical History:  He  has a past surgical history that includes Cystoscopy (2011); Knee arthroscopy with medial  menisectomy (Right, 11/08/2012); Total knee arthroplasty (Left, 05/18/2016); Knee arthroscopy (Left, 1990s?); Lipoma excision (Left); Knee Closed Reduction (Left, 08/18/2016); Mohs surgery (Left, 10/2015); Inguinal hernia repair (Left); Eye surgery (Left, 1961); Total knee arthroplasty (Right, 09/14/2016); Colonoscopy (08/29/2005); Retinal tear repair cryotherapy (Right, 11/2017); Spine surgery (02/07/2019); and cancerous mole removed-back (09/2019).  Family History:  His family history includes Anesthesia problems in his father.  Social History:  He  reports that he has never smoked. He has never used smokeless tobacco. He reports current alcohol use of about 3.0 - 4.0 standard drinks of alcohol per week. He reports that he does not use drugs.

## 2019-12-12 ENCOUNTER — Other Ambulatory Visit: Payer: Self-pay

## 2019-12-13 ENCOUNTER — Encounter: Payer: Self-pay | Admitting: Family Medicine

## 2019-12-13 ENCOUNTER — Ambulatory Visit (INDEPENDENT_AMBULATORY_CARE_PROVIDER_SITE_OTHER): Payer: Medicare Other | Admitting: Family Medicine

## 2019-12-13 VITALS — BP 160/80 | HR 122 | Temp 98.0°F | Wt 299.2 lb

## 2019-12-13 DIAGNOSIS — S39011D Strain of muscle, fascia and tendon of abdomen, subsequent encounter: Secondary | ICD-10-CM

## 2019-12-13 NOTE — Progress Notes (Signed)
   Subjective:    Patient ID: Jermaine [facility]., male    DOB: 04-29-1956, 64 y.o.   MRN: [id number]  HPI Here to follow up on left sided flank and groin pain. This started immediately after he lifted a heavy lawnmower. The pain starts in the left lower back and radiates around the left flank to the left groin. No masses or bulges are noted. He takes Ibuprofen during the day with adequate relief and Flexeril at bedtime. No bowel or urinary symptoms. He saw Dr. Regis Bill here on 12-04-19 and had an unremarkable exam. A UA that day was clear.    Review of Systems  Constitutional: Negative.   Respiratory: Negative.   Cardiovascular: Negative.   Gastrointestinal: Positive for abdominal pain. Negative for abdominal distention, anal bleeding, blood in stool, constipation, diarrhea, nausea, rectal pain and vomiting.  Genitourinary: Positive for flank pain. Negative for difficulty urinating, dysuria, frequency, hematuria, testicular pain and urgency.       Objective:   Physical Exam Constitutional:      Appearance: Normal appearance. He is not ill-appearing.  Cardiovascular:     Rate and Rhythm: Normal rate and regular rhythm.     Pulses: Normal pulses.     Heart sounds: Normal heart sounds.  Pulmonary:     Effort: Pulmonary effort is normal.     Breath sounds: Normal breath sounds.  Abdominal:     General: Abdomen is flat. Bowel sounds are normal. There is no distension.     Palpations: Abdomen is soft. There is no mass.     Tenderness: There is no guarding or rebound.     Hernia: No hernia is present.     Comments: Mildly tender in the left flank just superior to the pelvis and tender in the left groin. No masses or hernias felt   Neurological:     Mental Status: He is alert.           Assessment & Plan:  Abdominal strain, we will send him to PT for treatment.  Alysia Penna, MD

## 2019-12-18 ENCOUNTER — Other Ambulatory Visit: Payer: Self-pay

## 2019-12-18 ENCOUNTER — Encounter: Payer: Self-pay | Admitting: Physical Therapy

## 2019-12-18 ENCOUNTER — Ambulatory Visit: Payer: Medicare Other | Attending: Family Medicine | Admitting: Physical Therapy

## 2019-12-18 DIAGNOSIS — M545 Low back pain, unspecified: Secondary | ICD-10-CM

## 2019-12-18 DIAGNOSIS — R293 Abnormal posture: Secondary | ICD-10-CM | POA: Insufficient documentation

## 2019-12-18 DIAGNOSIS — M6281 Muscle weakness (generalized): Secondary | ICD-10-CM

## 2019-12-18 NOTE — Patient Instructions (Signed)
Access Code: [email or web address] URL: [email or web address] Date: 12/18/2019 Prepared by: Jari Favre  Exercises Supine Figure 4 Piriformis Stretch - 1 x daily - 7 x weekly - 3 reps - 1 sets - 30 sec hold Seated Hamstring Stretch - 1 x daily - 7 x weekly - 3 reps - 1 sets - 30 sec hold

## 2019-12-18 NOTE — Therapy (Signed)
[facility] [address], [place], [place], [address] Phone: [phone]   Fax:  [phone]  Physical Therapy Treatment  Patient Details  Name: Jermaine [facility]. MRN: [id number] Date of Birth: [birth year] Referring Provider (PT): Laurey Morale, MD   Encounter Date: 12/18/2019  PT End of Session - 12/18/19 1552    Visit Number  1    Date for PT Re-Evaluation  03/11/20    PT Start Time  1446    PT Stop Time  1530    PT Time Calculation (min)  44 min    Activity Tolerance  Patient tolerated treatment well    Behavior During Therapy  [facility] for tasks assessed/performed       Past Medical History:  Diagnosis Date  . 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 Chronic kidney disease   . 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 Dental crowns present   . Family history of adverse reaction to anesthesia    pt's father has hx. of post-op N/V  . High triglycerides    no current med.  Marland Kitchen History of gout   . History of kidney stones   . Hypertension    states under control with med., has been on med. ~ 45 yr.  . Non-insulin dependent type 2 diabetes mellitus ([place])   . Obesity, Class III, BMI 40-49.9 (morbid obesity) ([place])   . OSA on CPAP   . Osteoarthritis    knees (09/14/2016)  . Sleep apnea   . 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 cancerous mole removed-back  09/2019  . COLONOSCOPY  08/29/2005   per Dr. Sharlett Iles, diverticulosis only, repeat in 10 yrs   . CYSTOSCOPY  2011   "related to trace blood in urine"  . EYE SURGERY Left 1961   traumatic loss of eye  . INGUINAL HERNIA REPAIR Left   . KNEE ARTHROSCOPY Left 1990s?  Marland Kitchen KNEE ARTHROSCOPY WITH MEDIAL MENISECTOMY Right 11/08/2012   Procedure: RIGHT KNEE ARTHROSCOPY WITH PARTIAL MEDIAL AND LATERAL MENISECTOMIES, CHONDROPLASTY;  Surgeon: [name], MD;  Location: [facility];  Service: Orthopedics;  Laterality: Right;  RIGHT KNEE SCOPE WITH PARTIAL MEDIAL AND LATERAL  MENISCECTOMIES  AND CHONDROPLASTY  . KNEE CLOSED REDUCTION Left 08/18/2016   Procedure: LEFT  MANIPULATION KNEE;  Surgeon: [name], MD;  Location: [facility];  Service: Orthopedics;  Laterality: Left;  . LIPOMA EXCISION Left    upper back  . MOHS SURGERY Left 10/2015   "forehead"  . RETINAL TEAR REPAIR CRYOTHERAPY Right 11/2017  . SPINE SURGERY  02/07/2019  . TOTAL KNEE ARTHROPLASTY Left 05/18/2016   Procedure: LEFT TOTAL KNEE ARTHROPLASTY;  Surgeon: [name], MD;  Location: [facility];  Service: Orthopedics;  Laterality: Left;  . TOTAL KNEE ARTHROPLASTY Right 09/14/2016   Procedure: TOTAL KNEE ARTHROPLASTY MANIPULATION LEFT KNEE;  Surgeon: [name], MD;  Location: [facility];  Service: Orthopedics;  Laterality: Right;    There were no vitals filed for this visit.  Subjective Assessment - 12/18/19 1448    Subjective  Pt states he picked up a lawn mower to change the oil and got a pain shooting into the groin and low back. Had a ruptured lumbar disc and surgery in June    Pertinent History  lumbar surgery; hernia repair  Limitations  Standing    How long can you stand comfortably?  40 minutes    How long can you walk comfortably?  haven't been able to walk very long    Patient Stated Goals  normally walks 2 miles    Currently in Pain?  Yes    Pain Score  8    can get down to 0/10 in the right position   Pain Location  Back    Pain Orientation  Left    Pain Descriptors / Indicators  Aching    Pain Type  Acute pain    Pain Radiating Towards  down and around into the groin    Pain Onset  1 to 4 weeks ago    Pain Frequency  Intermittent    Aggravating Factors   sleeping and lying down seems to be the worst    Pain Relieving Factors  flexeril, ibuprofen, ice    Effect of Pain on Daily Activities  walking and yard work    Multiple Pain Sites  No          [facility] PT Assessment - 12/18/19 0001      Assessment   Medical Diagnosis  S39.011D (ICD-10-CM) - Strain of abdominal muscle, subsequent encounter    Referring Provider (PT)  Laurey Morale, MD    Onset Date/Surgical Date  --   3 weeks   Prior Therapy  no      Precautions   Precautions  None      Restrictions   Weight Bearing Restrictions  No      Balance Screen   Has the patient fallen in the past 6 months  No      [place] residence    Living Arrangements  Spouse/significant other      Prior Function   Level of Independence  Independent    Vocation  Retired    Leisure  walking 2 miles      Cognition   Overall Cognitive Status  Within Functional Limits for tasks assessed      Observation/Other Assessments   Focus on Therapeutic Outcomes (FOTO)   50% limited      Posture/Postural Control   Posture/Postural Control  Postural limitations    Postural Limitations  Rounded Shoulders;Anterior pelvic tilt;Left pelvic obliquity      ROM / Strength   AROM / PROM / Strength  PROM;Strength;AROM      AROM   Overall AROM Comments  lumbar flexion 50% limited      PROM   Overall PROM Comments  hip rotation 50% limited      Strength   Overall Strength Comments  hip abduction 4/5 Lt LE; hip flexion 4/5 LtLE      Flexibility   Soft Tissue Assessment /Muscle Length  yes    Hamstrings  40% limited   hip extension 50%     Palpation   SI assessment   Lt ilium anteriorly rotated      Ambulation/Gait   Gait Pattern  Decreased stride length    Gait Comments  very stiff and decreased hip extension; pain in stance phase Lt LE                   [facility] Adult PT Treatment/Exercise - 12/18/19 0001      Self-Care   Self-Care  Other Self-Care Comments    Other Self-Care Comments   intial HEP;       Manual Therapy  Manual Therapy  Muscle Energy Technique    Muscle Energy Technique  correct Lt rotation              PT Education - 12/18/19 1612    Education Details  Access Code: [email or web address]    Person(s) Educated  Patient    Methods  Explanation;Demonstration;Handout;Verbal cues    Comprehension  Verbalized understanding;Returned demonstration       PT Short Term Goals - 12/18/19 1554      PT SHORT TERM GOAL #1   Title  pt will report 50% less pain    Time  6    Period  Weeks    Status  New    Target Date  01/29/20      PT SHORT TERM GOAL #2   Title  Pt will demonstrate no obliquity of Lt ilium    Baseline  Lt ilium anterior rotation - corrected with MET    Time  6    Period  Weeks    Status  New    Target Date  01/29/20        PT Long Term Goals - 12/18/19 1553      PT LONG TERM GOAL #1   Title  Pt will be able to walk 2 miles 3x/week without pain shooting into his groin    Time  12    Period  Weeks    Status  New    Target Date  03/11/20      PT LONG TERM GOAL #2   Title  Pt will demonstrat improved core activation for lifting at least 40lb so he can return to doing heavy household chores such as yard work.    Time  12    Period  Weeks    Status  New    Target Date  03/11/20      PT LONG TERM GOAL #3   Title  Pt will report 75% less pain and able to sleep at night    Time  12    Period  Weeks    Status  New    Target Date  03/11/20      PT LONG TERM GOAL #4   Title  FOTO < or = to 31% limited    Time  12    Period  Weeks    Status  New    Target Date  03/11/20            Plan - 12/18/19 1602    Clinical Impression Statement  Pt recently lifted mower and had instant pain from low back/hip on Lt side and wraps around to the Lt groin/inguinal region.  Pt has history of ruptured disc and had surgery in June last year to fuse L3-5.  Pt demonstrates impairments includingLt pelvic obliquity in standing and lying down.  Pain with weight bearing on the left side.  Pt has hip weakness of abductors Lt>Rt.  He has relief and no pain with pelvic SI compression  and sacral distraction and compression.  Pt responded well to MET to correct Lt anterior pelvic rotation and no pain in standing afterwards.  He has increased abdominal adipose with anterior pelvic position in standing.  Pt has tight hip ER and hamstrings bilateral.  He has tight gluteals and lumbar paraspinals assed on the Lt side.  TTP to Lt QL and Lt SI joint line.  He has posture and gait abnormalities as mentioned above.  Pt will benefit from skilled PT to address these  impairments so he can return to walking for exercise and continue to manage housework including heavy outdoor activities.    Personal Factors and Comorbidities  Comorbidity 2    Comorbidities  hx of hernai repair (64y/o); lumbar fusion L3-5    Examination-Activity Limitations  Lift;Sleep    Examination-Participation Restrictions  Yard Work    Stability/Clinical Decision Making  Evolving/Moderate complexity    Clinical Decision Making  Moderate    Rehab Potential  Excellent    PT Frequency  2x / week    PT Duration  12 weeks    PT Treatment/Interventions  ADLs/Self Care Home Management;Biofeedback;Cryotherapy;Electrical Stimulation;Moist Heat;Traction;Neuromuscular re-education;Therapeutic exercise;Therapeutic activities;Patient/family education;Dry needling;Manual techniques;Passive range of motion;Taping    PT Next Visit Plan  check Lt anterior rotation, STM gluteal and QL Lt; review intial stretches given and add to HEP; begin core strength progression    PT Home Exercise Plan  Access Code: [email or web address]    Consulted and Agree with Plan of Care  Patient       Patient will benefit from skilled therapeutic intervention in order to improve the following deficits and impairments:  Abnormal gait, Pain, Increased muscle spasms, Increased fascial restricitons, Decreased range of motion, Decreased strength, Postural dysfunction  Visit Diagnosis: Acute left-sided low back pain without sciatica  Muscle weakness (generalized)  Abnormal  posture     Problem List Patient Active Problem List   Diagnosis Date Noted  . Lumbar stenosis with neurogenic claudication 02/07/2019  . Personal history of spine surgery 02/07/2019  . Rectal bleed 05/13/2018  . Primary localized osteoarthritis of right knee 09/14/2016  . S/P total knee arthroplasty 05/18/2016  . OSA (obstructive sleep apnea) 07/03/2012  . Cellulitis 07/01/2012  . Primary osteoarthritis of left knee 10/28/2009  . MICROSCOPIC HEMATURIA 08/04/2009  . ACHILLES TENDINITIS 07/02/2008  . GOUT 10/02/2007  . PSA, INCREASED 08/13/2007  . Type 2 diabetes mellitus ([place]) 04/20/2007  . Dyslipidemia 04/20/2007  . SKIN TAG 04/20/2007  . Essential hypertension 03/26/2007  . ALLERGIC RHINITIS 03/26/2007    Jule Ser, PT 12/18/2019, 5:33 PM  [facility] [address], [place], [place], [address] Phone: [phone]   Fax:  [phone]  Name: Jermaine [facility]. MRN: [id number] Date of Birth: 1955-11-14

## 2019-12-20 ENCOUNTER — Other Ambulatory Visit: Payer: Self-pay

## 2019-12-20 ENCOUNTER — Encounter: Payer: Self-pay | Admitting: Physical Therapy

## 2019-12-20 ENCOUNTER — Ambulatory Visit: Payer: Medicare Other | Admitting: Physical Therapy

## 2019-12-20 DIAGNOSIS — M545 Low back pain, unspecified: Secondary | ICD-10-CM

## 2019-12-20 DIAGNOSIS — R293 Abnormal posture: Secondary | ICD-10-CM

## 2019-12-20 DIAGNOSIS — M6281 Muscle weakness (generalized): Secondary | ICD-10-CM

## 2019-12-20 NOTE — Patient Instructions (Signed)
Access Code: [email or web address] URL: [email or web address] Date: 12/20/2019 Prepared by: Earlie Counts  Exercises Supine Figure 4 Piriformis Stretch - 1 x daily - 7 x weekly - 3 reps - 1 sets - 30 sec hold Seated Hamstring Stretch - 1 x daily - 7 x weekly - 3 reps - 1 sets - 30 sec hold Supine Transversus Abdominis Bracing - Hands on Stomach - 1 x daily - 7 x weekly - 2 sets - 5 reps - 5 sec hold  Patient Education [facility] [address], [place], [place] [address] Phone # [phone] Fax [phone]

## 2019-12-20 NOTE — Therapy (Signed)
[facility] [address] [place], [place], [address] Phone: [phone]   Fax:  [phone]  Physical Therapy Treatment  Patient Details  Name: Jermaine James. MRN: [id number] Date of Birth: 09/18/1955 Referring Provider (PT): Laurey Morale, MD   Encounter Date: 12/20/2019  PT End of Session - 12/20/19 1109    Visit Number  2    Date for PT Re-Evaluation  03/11/20    Authorization Type  Medicare [employer] [place]    PT Start Time  1015    PT Stop Time  1055    PT Time Calculation (min)  40 min    Activity Tolerance  Patient tolerated treatment well;No increased pain    Behavior During Therapy  WFL for tasks assessed/performed       Past Medical History:  Diagnosis Date  . 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 Chronic kidney disease   . 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 Dental crowns present   . Family history of adverse reaction to anesthesia    pt's father has hx. of post-op N/V  . High triglycerides    no current med.  Marland Kitchen History of gout   . History of kidney stones   . Hypertension    states under control with med., has been on med. ~ 28 yr.  . Non-insulin dependent type 2 diabetes mellitus ([place])   . Obesity, Class III, BMI 40-49.9 (morbid obesity) ([place])   . OSA on CPAP   . Osteoarthritis    knees (09/14/2016)  . Sleep apnea   . 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 cancerous mole removed-back  09/2019  . COLONOSCOPY  08/29/2005   per Dr. Sharlett Iles, diverticulosis only, repeat in 10 yrs   . CYSTOSCOPY  2011   "related to trace blood in urine"  . EYE SURGERY Left 1961   traumatic loss of eye  . INGUINAL HERNIA REPAIR Left   . KNEE ARTHROSCOPY Left 1990s?  Marland Kitchen KNEE ARTHROSCOPY WITH MEDIAL MENISECTOMY Right 11/08/2012   Procedure: RIGHT KNEE ARTHROSCOPY WITH PARTIAL MEDIAL AND LATERAL MENISECTOMIES,  CHONDROPLASTY;  Surgeon: [name], MD;  Location: [place];  Service: Orthopedics;  Laterality: Right;  RIGHT KNEE SCOPE WITH PARTIAL MEDIAL AND LATERAL  MENISCECTOMIES  AND CHONDROPLASTY  . KNEE CLOSED REDUCTION Left 08/18/2016   Procedure: LEFT  MANIPULATION KNEE;  Surgeon: [name], MD;  Location: [place];  Service: Orthopedics;  Laterality: Left;  . LIPOMA EXCISION Left    upper back  . MOHS SURGERY Left 10/2015   "forehead"  . RETINAL TEAR REPAIR CRYOTHERAPY Right 11/2017  . SPINE SURGERY  02/07/2019  . TOTAL KNEE ARTHROPLASTY Left 05/18/2016   Procedure: LEFT TOTAL KNEE ARTHROPLASTY;  Surgeon: [name], MD;  Location: [place];  Service: Orthopedics;  Laterality: Left;  . TOTAL KNEE ARTHROPLASTY Right 09/14/2016   Procedure: TOTAL KNEE ARTHROPLASTY MANIPULATION LEFT KNEE;  Surgeon: [name], MD;  Location: [place];  Service: Orthopedics;  Laterality: Right;    There were no vitals filed for this visit.  Subjective Assessment - 12/20/19 1019    Subjective  some discomfort in the left lower abdomen. I had alot of pain in the area.    Pertinent History  lumbar surgery; hernia repair    Limitations  Standing    How long can you stand comfortably?  40 minutes    Patient Stated Goals  normally walks 2 miles    Currently in Pain?  Yes    Pain Score  3     Pain Location  Back    Pain Orientation  Left    Pain Descriptors / Indicators  Aching    Pain Type  Acute pain    Pain Onset  1 to 4 weeks ago    Pain Frequency  Intermittent    Aggravating Factors   sleeping and lying down seems to be the worst    Pain Relieving Factors  flexeril, ibuprogen, ice    Effect of Pain on Daily Activities  walking and yard work    Multiple Pain Sites  No         [facility] PT Assessment - 12/20/19 0001      Palpation   SI assessment   Lt ilium anteriorly rotated                   [facility] Adult PT Treatment/Exercise - 12/20/19 0001       Self-Care   Self-Care  Other Self-Care Comments    Other Self-Care Comments   educated patient on how to use a tennis ball to massage his thoracic and lumbar spine in standing      Therapeutic Activites    Therapeutic Activities  Other Therapeutic Activities    Other Therapeutic Activities  bracing the abdominals and breath out with transitional movements in bed      Lumbar Exercises: Supine   Ab Set  10 reps;5 seconds    AB Set Limitations  with tactile cues to engage the abdominals without holding his breath    Other Supine Lumbar Exercises  tried pelvic rock with and without round pillow but unable to coordinate      Knee/Hip Exercises: Stretches   Active Hamstring Stretch  Right;Left;2 reps;30 seconds    Active Hamstring Stretch Limitations  sitting    Piriformis Stretch  Right;Left;1 rep;30 seconds    Piriformis Stretch Limitations  supine      Manual Therapy   Manual Therapy  Soft tissue mobilization;Myofascial release    Soft tissue mobilization  lumbar and thoracic paraspinals, along the sides of the lumbar to the anterior abdominal, and upper gluteal    Myofascial Release  using the suction cup to release the fascia of the lumbar area and along the scar to improve blood flow and tissue mobility    Muscle Energy Technique  correct Lt rotation       Trigger Point Dry Needling - 12/20/19 0001    Consent Given?  Yes    Education Handout Provided  Yes    Muscles Treated Back/Hip  Quadratus lumborum;Lumbar multifidi   left   Lumbar multifidi Response  Twitch response elicited;Palpable increased muscle length    Quadratus Lumborum Response  Twitch response elicited;Palpable increased muscle length           PT Education - 12/20/19 1109    Education Details  Access Code: [email or web address]; information on dry needling    Person(s) Educated  Patient    Methods  Explanation;Demonstration;Verbal cues;Handout    Comprehension  Verbalized understanding;Returned demonstration        PT Short Term Goals - 12/18/19 1554      PT SHORT TERM GOAL #1   Title  pt will report 50% less pain    Time  6  Period  Weeks    Status  New    Target Date  01/29/20      PT SHORT TERM GOAL #2   Title  Pt will demonstrate no obliquity of Lt ilium    Baseline  Lt ilium anterior rotation - corrected with MET    Time  6    Period  Weeks    Status  New    Target Date  01/29/20        PT Long Term Goals - 12/18/19 1553      PT LONG TERM GOAL #1   Title  Pt will be able to walk 2 miles 3x/week without pain shooting into his groin    Time  12    Period  Weeks    Status  New    Target Date  03/11/20      PT LONG TERM GOAL #2   Title  Pt will demonstrat improved core activation for lifting at least 40lb so he can return to doing heavy household chores such as yard work.    Time  12    Period  Weeks    Status  New    Target Date  03/11/20      PT LONG TERM GOAL #3   Title  Pt will report 75% less pain and able to sleep at night    Time  12    Period  Weeks    Status  New    Target Date  03/11/20      PT LONG TERM GOAL #4   Title  FOTO < or = to 31% limited    Time  12    Period  Weeks    Status  New    Target Date  03/11/20            Plan - 12/20/19 1110    Clinical Impression Statement  Patient had difficulty with pelvic tilt in supine. Patient will hold his breath in transional movements placing strain on the lower abdomen. Patient had improved blood flow and tissue mobility after the dry needling and manual work. Patient was able to engage the lower abdominals with transverse abdominal contraction. Patient will benefit from skilled therapy to address these impairments so he can return to walking for exericse and continue to manage housework including heavy actiities.    Personal Factors and Comorbidities  Comorbidity 2    Comorbidities  hx of hernai repair (64y/o); lumbar fusion L3-5    Examination-Activity Limitations  Lift;Sleep     Examination-Participation Restrictions  Yard Work    Stability/Clinical Decision Making  Evolving/Moderate complexity    Rehab Potential  Excellent    PT Frequency  2x / week    PT Duration  12 weeks    PT Treatment/Interventions  ADLs/Self Care Home Management;Biofeedback;Cryotherapy;Electrical Stimulation;Moist Heat;Traction;Neuromuscular re-education;Therapeutic exercise;Therapeutic activities;Patient/family education;Dry needling;Manual techniques;Passive range of motion;Taping    PT Next Visit Plan  assess dry needling, work on abdominal bracing with arm and leg movement; how to brace abdominals with transitional movement, STW to the left lower abdominal area; nustep    PT Home Exercise Plan  Access Code: [email or web address]    Recommended Other Services  MD signed initial eval    Consulted and Agree with Plan of Care  Patient       Patient will benefit from skilled therapeutic intervention in order to improve the following deficits and impairments:  Abnormal gait, Pain, Increased muscle spasms, Increased fascial restricitons, Decreased range of motion, Decreased  strength, Postural dysfunction  Visit Diagnosis: Acute left-sided low back pain without sciatica  Muscle weakness (generalized)  Abnormal posture     Problem List Patient Active Problem List   Diagnosis Date Noted  . Lumbar stenosis with neurogenic claudication 02/07/2019  . Personal history of spine surgery 02/07/2019  . Rectal bleed 05/13/2018  . Primary localized osteoarthritis of right knee 09/14/2016  . S/P total knee arthroplasty 05/18/2016  . OSA (obstructive sleep apnea) 07/03/2012  . Cellulitis 07/01/2012  . Primary osteoarthritis of left knee 10/28/2009  . MICROSCOPIC HEMATURIA 08/04/2009  . ACHILLES TENDINITIS 07/02/2008  . GOUT 10/02/2007  . PSA, INCREASED 08/13/2007  . Type 2 diabetes mellitus ([place]) 04/20/2007  . Dyslipidemia 04/20/2007  . SKIN TAG 04/20/2007  . Essential hypertension 03/26/2007  . ALLERGIC  RHINITIS 03/26/2007    Earlie Counts, PT 12/20/19 11:14 AM   [facility] [address] [place], [place], [address] Phone: [phone]   Fax:  [phone]  Name: Jermaine James. MRN: [id number] Date of Birth: [birth year]

## 2019-12-25 ENCOUNTER — Other Ambulatory Visit: Payer: Self-pay

## 2019-12-25 ENCOUNTER — Encounter: Payer: Self-pay | Admitting: Physical Therapy

## 2019-12-25 ENCOUNTER — Ambulatory Visit: Payer: Medicare Other | Admitting: Physical Therapy

## 2019-12-25 DIAGNOSIS — M545 Low back pain, unspecified: Secondary | ICD-10-CM

## 2019-12-25 DIAGNOSIS — M6281 Muscle weakness (generalized): Secondary | ICD-10-CM

## 2019-12-25 DIAGNOSIS — R293 Abnormal posture: Secondary | ICD-10-CM

## 2019-12-25 NOTE — Therapy (Addendum)
[facility] [address], [place], [place], [address] Phone: [phone]   Fax:  [phone]  Physical Therapy Treatment  Patient Details  Name: Jermaine James. MRN: [id number] Date of Birth: 12-03-1955 Referring Provider (PT): Laurey Morale, MD   Encounter Date: 12/25/2019  PT End of Session - 12/25/19 1358    Visit Number  3    Date for PT Re-Evaluation  03/11/20    Authorization Type  [employer] [place]    PT Start Time  1400    PT Stop Time  1447    PT Time Calculation (min)  47 min    Activity Tolerance  Patient tolerated treatment well;No increased pain    Behavior During Therapy  WFL for tasks assessed/performed       Past Medical History:  Diagnosis Date  . 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 Chronic kidney disease   . 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 Dental crowns present   . Family history of adverse reaction to anesthesia    pt's father has hx. of post-op N/V  . High triglycerides    no current med.  Marland Kitchen History of gout   . History of kidney stones   . Hypertension    states under control with med., has been on med. ~ 45 yr.  . Non-insulin dependent type 2 diabetes mellitus ([place])   . Obesity, Class III, BMI 40-49.9 (morbid obesity) ([place])   . OSA on CPAP   . Osteoarthritis    knees (09/14/2016)  . Sleep apnea   . 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 cancerous mole removed-back  09/2019  . COLONOSCOPY  08/29/2005   per Dr. Sharlett Iles, diverticulosis only, repeat in 10 yrs   . CYSTOSCOPY  2011   "related to trace blood in urine"  . EYE SURGERY Left 1961   traumatic loss of eye  . INGUINAL HERNIA REPAIR Left   . KNEE ARTHROSCOPY Left 1990s?  Marland Kitchen KNEE ARTHROSCOPY WITH MEDIAL MENISECTOMY Right 11/08/2012   Procedure: RIGHT KNEE ARTHROSCOPY WITH PARTIAL MEDIAL AND LATERAL MENISECTOMIES,  CHONDROPLASTY;  Surgeon: [name], MD;  Location: [place];  Service: Orthopedics;  Laterality: Right;  RIGHT KNEE SCOPE WITH PARTIAL MEDIAL AND LATERAL  MENISCECTOMIES  AND CHONDROPLASTY  . KNEE CLOSED REDUCTION Left 08/18/2016   Procedure: LEFT  MANIPULATION KNEE;  Surgeon: [name], MD;  Location: [place];  Service: Orthopedics;  Laterality: Left;  . LIPOMA EXCISION Left    upper back  . MOHS SURGERY Left 10/2015   "forehead"  . RETINAL TEAR REPAIR CRYOTHERAPY Right 11/2017  . SPINE SURGERY  02/07/2019  . TOTAL KNEE ARTHROPLASTY Left 05/18/2016   Procedure: LEFT TOTAL KNEE ARTHROPLASTY;  Surgeon: [name], MD;  Location: [place];  Service: Orthopedics;  Laterality: Left;  . TOTAL KNEE ARTHROPLASTY Right 09/14/2016   Procedure: TOTAL KNEE ARTHROPLASTY MANIPULATION LEFT KNEE;  Surgeon: [name], MD;  Location: [place];  Service: Orthopedics;  Laterality: Right;    There were no vitals filed for this visit.  Subjective Assessment - 12/25/19 1359    Subjective  I'm better I think. Pain isn't hitting me too hard until end of day 7-8/10 resolves with Ibuprofen.  During the day it is manageable. Just some discomfort across the  small of my back today.    Pertinent History  lumbar surgery; hernia repair    Limitations  Standing    Patient Stated Goals  normally walks 2 miles    Currently in Pain?  Yes    Pain Score  1     Pain Location  Back    Pain Orientation  Left    Pain Descriptors / Indicators  Aching                        [facility] Adult PT Treatment/Exercise - 12/25/19 0001      Posture/Postural Control   Posture Comments  even pelvis today      Exercises   Exercises  Lumbar      Lumbar Exercises: Stretches   Piriformis Stretch  Left;1 rep;60 seconds    Piriformis Stretch Limitations  modiified pigeon on table with trunk lean    Other Lumbar Stretch Exercise  left hip ADD stretch with strap 5 x 20 sec       Lumbar Exercises: Supine   Ab Set  5 reps;5 seconds    AB Set Limitations  with TCs for correct form VCs to keep obliques and RA relaxed.    Clam  5 reps    Clam Limitations  unilateral with VCs and TCs for TA contraction    Bridge  5 reps    Other Supine Lumbar Exercises  lower trunk rotation 2x 10 sec bil      Lumbar Exercises: Sidelying   Clam  Left;20 reps      Manual Therapy   Manual Therapy  Soft tissue mobilization;Myofascial release    Manual therapy comments  skilled palpation and monitoring of soft tissues during DN    Soft tissue mobilization  to left QL, lumbar, lower abdominals and gluteals    Myofascial Release  TPR to left QL in [facility]       Trigger Point Dry Needling - 12/25/19 0001    Consent Given?  Yes    Education Handout Provided  Previously provided    Muscles Treated Back/Hip  Gluteus minimus;Gluteus medius    Dry Needling Comments  left    Gluteus Minimus Response  Twitch response elicited;Palpable increased muscle length    Gluteus Medius Response  Twitch response elicited;Palpable increased muscle length             PT Short Term Goals - 12/18/19 1554      PT SHORT TERM GOAL #1   Title  pt will report 50% less pain    Time  6    Period  Weeks    Status  New    Target Date  01/29/20      PT SHORT TERM GOAL #2   Title  Pt will demonstrate no obliquity of Lt ilium    Baseline  Lt ilium anterior rotation - corrected with MET    Time  6    Period  Weeks    Status  New    Target Date  01/29/20        PT Long Term Goals - 12/18/19 1553      PT LONG TERM GOAL #1   Title  Pt will be able to walk 2 miles 3x/week without pain shooting into his groin    Time  12    Period  Weeks    Status  New    Target Date  03/11/20      PT LONG TERM  GOAL #2   Title  Pt will demonstrat improved core activation for lifting at least 40lb so he can return to doing heavy household chores such as yard work.    Time  12    Period  Weeks    Status  New     Target Date  03/11/20      PT LONG TERM GOAL #3   Title  Pt will report 75% less pain and able to sleep at night    Time  12    Period  Weeks    Status  New    Target Date  03/11/20      PT LONG TERM GOAL #4   Title  FOTO < or = to 31% limited    Time  12    Period  Weeks    Status  New    Target Date  03/11/20            Plan - 12/25/19 1546    Clinical Impression Statement  Patient reporting some improvements overall. He has less pain during the day but by EOD pain is up to 7-8/10. He did better today with Transverse abdominus set, but did require extensive verbal and tactile cues to engage correctly. PT encouraged pt to continue working on TA in supine and with ADLS. He continues to have marked tightness in left QL, lumbar and gluteals. Good response to manual therapy and to DN in gluteals. LTGs are ongoing.    Comorbidities  hx of hernai repair (64y/o); lumbar fusion L3-5    PT Frequency  2x / week    PT Duration  12 weeks    PT Treatment/Interventions  ADLs/Self Care Home Management;Biofeedback;Cryotherapy;Electrical Stimulation;Moist Heat;Traction;Neuromuscular re-education;Therapeutic exercise;Therapeutic activities;Patient/family education;Dry needling;Manual techniques;Passive range of motion;Taping    PT Next Visit Plan  assess dry needling, work on abdominal bracing with arm and leg movement; how to brace abdominals with transitional movement, STW to the left lower abdominal area; nustep       Patient will benefit from skilled therapeutic intervention in order to improve the following deficits and impairments:  Abnormal gait, Pain, Increased muscle spasms, Increased fascial restricitons, Decreased range of motion, Decreased strength, Postural dysfunction  Visit Diagnosis: Acute left-sided low back pain without sciatica  Muscle weakness (generalized)  Abnormal posture     Problem List Patient Active Problem List   Diagnosis Date Noted  . Lumbar stenosis with  neurogenic claudication 02/07/2019  . Personal history of spine surgery 02/07/2019  . Rectal bleed 05/13/2018  . Primary localized osteoarthritis of right knee 09/14/2016  . S/P total knee arthroplasty 05/18/2016  . OSA (obstructive sleep apnea) 07/03/2012  . Cellulitis 07/01/2012  . Primary osteoarthritis of left knee 10/28/2009  . MICROSCOPIC HEMATURIA 08/04/2009  . ACHILLES TENDINITIS 07/02/2008  . GOUT 10/02/2007  . PSA, INCREASED 08/13/2007  . Type 2 diabetes mellitus ([place]) 04/20/2007  . Dyslipidemia 04/20/2007  . SKIN TAG 04/20/2007  . Essential hypertension 03/26/2007  . ALLERGIC RHINITIS 03/26/2007    Madelyn Flavors PT 12/25/2019, 3:53 PM  [facility] [address] [place], [place], [address] Phone: [phone]   Fax:  [phone]  Name: Micharl Helmes. MRN: [id number] Date of Birth: [birth year]  PHYSICAL THERAPY DISCHARGE SUMMARY  Visits from Start of Care: 3  Current functional level related to goals / functional outcomes: See above. Patient cancelled the remaining appointments on 01/01/2020 due to feeling better.    Remaining deficits: See above.  Education / Equipment: HEP Plan: Patient agrees to discharge.  Patient goals were not met. Patient is being discharged due to being pleased with the current functional level. Thank you for the referral. Earlie Counts, PT 01/01/20 2:21 PM   ?????

## 2020-01-09 ENCOUNTER — Encounter: Payer: Medicare Other | Admitting: Physical Therapy

## 2020-01-15 ENCOUNTER — Encounter: Payer: Medicare Other | Admitting: Physical Therapy

## 2020-01-15 MED FILL — metFORMIN HCL 1000 MG TABS: 1000 | 90 days supply | Qty: 180 | Fill #1

## 2020-01-22 ENCOUNTER — Other Ambulatory Visit: Payer: Self-pay

## 2020-01-22 ENCOUNTER — Encounter: Payer: Self-pay | Admitting: Internal Medicine

## 2020-01-22 ENCOUNTER — Ambulatory Visit (INDEPENDENT_AMBULATORY_CARE_PROVIDER_SITE_OTHER): Payer: Medicare Other | Admitting: Internal Medicine

## 2020-01-22 VITALS — BP 148/80 | HR 101 | Ht 71.0 in | Wt 292.0 lb

## 2020-01-22 DIAGNOSIS — E669 Obesity, unspecified: Secondary | ICD-10-CM

## 2020-01-22 DIAGNOSIS — E785 Hyperlipidemia, unspecified: Secondary | ICD-10-CM

## 2020-01-22 DIAGNOSIS — E1165 Type 2 diabetes mellitus with hyperglycemia: Secondary | ICD-10-CM

## 2020-01-22 LAB — POCT GLYCOSYLATED HEMOGLOBIN (HGB A1C): Hemoglobin A1C: 6.2 % — AB (ref 4.0–5.6)

## 2020-01-22 LAB — MICROALBUMIN / CREATININE URINE RATIO
Creatinine,U: 97.4 mg/dL
Microalb Creat Ratio: 1.7 mg/g (ref 0.0–30.0)
Microalb, Ur: 1.6 mg/dL (ref 0.0–1.9)

## 2020-01-22 NOTE — Progress Notes (Signed)
Patient ID: Jermaine James., male   DOB: 13-Mar-1956, 64 y.o.   MRN: [id number]   This visit occurred during the SARS-CoV-2 public health emergency.  Safety protocols were in place, including screening questions prior to the visit, additional usage of staff PPE, and extensive cleaning of exam room while observing appropriate contact time as indicated for disinfecting solutions.   HPI: Jermaine James. is a 64 y.o.-year-old male-year-old male, initially referred by his PCP, Dr. Sarajane Jews, returning for follow-up for DM2, dx in 2012, non-insulin-dependent, uncontrolled, without long-term complications. He is the husband of Jermaine James, who is also my pt. last visit 3 months ago.  Reviewed HbA1c levels: Lab Results  Component Value Date   HGBA1C 7.6 (A) 10/18/2019   HGBA1C 8.3 (H) 07/18/2019   HGBA1C 7.6 (H) 01/30/2019   HGBA1C 6.8 (H) 02/01/2018   HGBA1C 7.0 (H) 09/02/2016   HGBA1C 6.5 04/20/2016   HGBA1C 6.1 05/12/2015   HGBA1C 6.5 01/16/2014   HGBA1C 7.0 (H) 08/20/2012   HGBA1C 6.5 (H) 07/02/2012   HGBA1C 6.6 (H) 07/05/2011   HGBA1C 6.4 06/23/2010   HGBA1C 6.4 10/30/2009   HGBA1C 6.4 07/24/2009   HGBA1C 6.3 (H) 04/20/2007   Pt is on a regimen of: - Metformin 500 mg 2x a day, with meals >> 1000 mg 2x a day with meals-tolerated well  He was not checking sugars at last visit.  Now he checks once a day, rotating check times and brings a good blood sugar log: - am: n/c (150-180 on labs) >> 110-129 - 2h after b'fast: n/c >> 120-138, 176 - before lunch: n/c >> 115-137, 179, 183 - 2h after lunch: n/c >> 126-189, 206 - before dinner: n/c >> 105-140, 169 - 2h after dinner: n/c >> 157, 167, 239 - bedtime: n/c >> 141-187 - nighttime: n/c Lowest sugar was 150 >> 105  Highest sugar was 180 >> 239 (soda)  Glucometer: none >> True Focus  Pt's meals are: - Breakfast: cheerios + 1-2% milk; pancake + waffle + bacon; OJ - Lunch: sandwich - PB/turkey/nan - Dinner: meat + starch + veggies - Snacks:  1-2x a day -  Since last visit, he reduced sweets and  juice.  -  no CKD, last BUN/creatinine:  Lab Results  Component Value Date   BUN 15 07/18/2019   BUN 12 05/17/2019   CREATININE 0.91 07/18/2019   CREATININE 0.90 05/17/2019  On lisinopril 10.  -+ HL; last set of lipids: Lab Results  Component Value Date   CHOL 110 11/28/2019   HDL 30.20 (L) 11/28/2019   LDLCALC 54 11/28/2019   LDLDIRECT 86.0 02/01/2018   TRIG 130.0 11/28/2019   CHOLHDL 4 11/28/2019  On fenofibrate 160.  - last eye exam was in 07/2019: No DR. Artificial eye OS.  Cataract OD.  - he denies numbness and tingling in his feet.  Pt has no FH of DM.  He also has a history of OSA - on CPAP, gout, HTN.  ROS: Constitutional: no weight gain/+ weight loss, no fatigue, no subjective hyperthermia, no subjective hypothermia Eyes: no blurry vision, no xerophthalmia ENT: no sore throat, no nodules palpated in neck, no dysphagia, no odynophagia, no hoarseness Cardiovascular: no CP/no SOB/no palpitations/no leg swelling Respiratory: no cough/no SOB/no wheezing Gastrointestinal: no N/no V/no D/no C/no acid reflux Musculoskeletal: no muscle aches/no joint aches Skin: no rashes, no hair loss Neurological: no tremors/no numbness/no tingling/no dizziness  I reviewed pt's medications, allergies, PMH, social hx, family hx, and changes were documented  in the history of present illness. Otherwise, unchanged from my initial visit note.  Past Medical History:  Diagnosis Date  . 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 Chronic kidney disease   . 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 Dental crowns present   . Family history of adverse reaction to anesthesia    pt's father has hx. of post-op N/V  . High triglycerides    no current med.  Marland Kitchen History of gout   . History of kidney stones   . Hypertension    states under control with med., has been on med. ~ 64 yr.  . Non-insulin dependent type 2 diabetes mellitus  ([place])   . Obesity, Class III, BMI 40-49.9 (morbid obesity) ([place])   . OSA on CPAP   . Osteoarthritis    knees (09/14/2016)  . Sleep apnea   . 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 cancerous mole removed-back  09/2019  . COLONOSCOPY  08/29/2005   per Dr. Sharlett Iles, diverticulosis only, repeat in 10 yrs   . CYSTOSCOPY  2011   "related to trace blood in urine"  . EYE SURGERY Left 1961   traumatic loss of eye  . INGUINAL HERNIA REPAIR Left   . KNEE ARTHROSCOPY Left 1990s?  Marland Kitchen KNEE ARTHROSCOPY WITH MEDIAL MENISECTOMY Right 11/08/2012   Procedure: RIGHT KNEE ARTHROSCOPY WITH PARTIAL MEDIAL AND LATERAL MENISECTOMIES, CHONDROPLASTY;  Surgeon: [name], MD;  Location: [facility];  Service: Orthopedics;  Laterality: Right;  RIGHT KNEE SCOPE WITH PARTIAL MEDIAL AND LATERAL  MENISCECTOMIES  AND CHONDROPLASTY  . KNEE CLOSED REDUCTION Left 08/18/2016   Procedure: LEFT  MANIPULATION KNEE;  Surgeon: [name], MD;  Location: [facility];  Service: Orthopedics;  Laterality: Left;  . LIPOMA EXCISION Left    upper back  . MOHS SURGERY Left 10/2015   "forehead"  . RETINAL TEAR REPAIR CRYOTHERAPY Right 11/2017  . SPINE SURGERY  02/07/2019  . TOTAL KNEE ARTHROPLASTY Left 05/18/2016   Procedure: LEFT TOTAL KNEE ARTHROPLASTY;  Surgeon: [name], MD;  Location: [facility];  Service: Orthopedics;  Laterality: Left;  . TOTAL KNEE ARTHROPLASTY Right 09/14/2016   Procedure: TOTAL KNEE ARTHROPLASTY MANIPULATION LEFT KNEE;  Surgeon: [name], MD;  Location: [place];  Service: Orthopedics;  Laterality: Right;   Social History   Socioeconomic History  . Marital status: Married    Spouse name: Not on file  . Number of children: 3  . Years of education: Not on file  . Highest education level: Not on file  Occupational History  . Occupation: Customer service manager -retired  Tobacco Use  . Smoking status: Never  Smoker  . Smokeless tobacco: Never Used  Substance and Sexual Activity  . Alcohol use: Yes    Alcohol/week: 3.0 - 4.0 standard drinks    Types: Beer, wine, mixed  . Drug use: No  . Sexual activity: Yes  Other Topics Concern  . Not on file  Social History Narrative  . Not on file   Social Determinants of Health   Financial Resource Strain:   . Difficulty of Paying Living Expenses: Not on file  Food Insecurity:   . Worried About Charity fundraiser in the Last Year: Not on file  . Ran Out of Food in the Last Year: Not on file  Transportation Needs:   .  Lack of Transportation (Medical): Not on file  . Lack of Transportation (Non-Medical): Not on file  Physical Activity:   . Days of Exercise per Week: Not on file  . Minutes of Exercise per Session: Not on file  Stress:   . Feeling of Stress : Not on file  Social Connections:   . Frequency of Communication with Friends and Family: Not on file  . Frequency of Social Gatherings with Friends and Family: Not on file  . Attends Religious Services: Not on file  . Active Member of Clubs or Organizations: Not on file  . Attends Archivist Meetings: Not on file  . Marital Status: Not on file  Intimate Partner Violence:   . Fear of Current or Ex-Partner: Not on file  . Emotionally Abused: Not on file  . Physically Abused: Not on file  . Sexually Abused: Not on file   Current Outpatient Medications on File Prior to Visit  Medication Sig Dispense Refill  . cyclobenzaprine (FLEXERIL) 10 MG tablet Take 1 tablet (10 mg total) by mouth 3 (three) times daily as needed for muscle spasms. 24 tablet 0  . fenofibrate 160 MG tablet TAKE 1 TABLET (160 MG TOTAL) BY MOUTH DAILY. 90 tablet 3  . fluticasone (FLONASE) 50 MCG/ACT nasal spray Place 1-2 sprays into both nostrils daily for 7 days. (Patient taking differently: Place 1-2 sprays into both nostrils daily as needed. ) 1 g 0  . lisinopril (ZESTRIL) 20 MG tablet Take 1 tablet (20 mg  total) by mouth daily. 90 tablet 3  . metFORMIN (GLUCOPHAGE) 1000 MG tablet TAKE 1 TABLET (1000 MG TOTAL) BY MOUTH 2 TIMES DAILY WITH A MEAL. 180 tablet 3  . sildenafil (VIAGRA) 100 MG tablet Take 1 tablet (100 mg total) by mouth as needed for erectile dysfunction. 10 tablet 11  . [DISCONTINUED] FENOFIBRATE PO Take by mouth.     No current facility-administered medications on file prior to visit.   No Known Allergies Family History  Problem Relation Age of Onset  . Anesthesia problems Father        post-op N/V  . Colon cancer Neg Hx   . Esophageal cancer Neg Hx   . Rectal cancer Neg Hx   . Stomach cancer Neg Hx     PE: BP (!) 148/80   Pulse (!) 101   Ht 5\' 11"  (1.803 m)   Wt 292 lb (132.5 kg)   SpO2 98%   BMI 40.73 kg/m  Wt Readings from Last 3 Encounters:  01/22/20 292 lb (132.5 kg)  12/13/19 299 lb 3.2 oz (135.7 kg)  12/04/19 (!) 304 lb 12.8 oz (138.3 kg)   Constitutional: overweight, in NAD Eyes: PERRLA, EOMI, no exophthalmos ENT: moist mucous membranes, no thyromegaly, no cervical lymphadenopathy Cardiovascular: RRR, No MRG Respiratory: CTA B Gastrointestinal: abdomen soft, NT, ND, BS+ Musculoskeletal: no deformities, strength intact in all 4 Skin: moist, warm, no rashes Neurological: no tremor with outstretched hands, DTR normal in all 4  ASSESSMENT: 1. DM2, non-insulin-dependent, uncontrolled, without long-term complications, but with hyperglycemia  2. HL  3.  Obesity class III  PLAN:  1. Patient with longstanding, uncontrolled, type 2 diabetes, on oral antidiabetic regimen with Metformin only, which we increased at last visit.  At that time, HbA1c was slightly better, at 7.6%.  He was not checking sugars at that time and I strongly advised him to start.  He was also drinking juice and eating sherbet, chips, and other sugary and fatty snacks every  day and I strongly advised him to stop this.  I also referred him to nutrition.  He did see the nutritionist, Antonieta Iba, since then. -At this visit, sugars are almost all at goal, with 3 exceptions, but improving every week.  He still has some hyperglycemic spikes with dietary indiscretions but he is working towards eliminating sweets and juice.  He is still occasionally having these and I strongly advised him to stop completely.  Also, his sugars increase after cereals in the morning and we discussed about healthier options: Switching to almond milk, adding variations or Chia seeds to his cereals. -Overall, his diabetes appears to be much better control so for now, we will not change his regimen.  He is tolerating the higher dose of Metformin very well. -I suggested to: Patient Instructions  Please continue: - Metformin 1000 mg 2x a day with meals  Please return in 4 months with your sugar log.   - we checked his HbA1c: 6.2% (much better) - advised to check sugars at different times of the day - 1x a day, rotating check times - advised for yearly eye exams >> he is UTD - will check an ACR today - return to clinic in 4 months  2. HL - Reviewed latest lipid panel from 11/2019: LDL at goal, HDL slightly low: Lab Results  Component Value Date   CHOL 110 11/28/2019   HDL 30.20 (L) 11/28/2019   LDLCALC 54 11/28/2019   LDLDIRECT 86.0 02/01/2018   TRIG 130.0 11/28/2019   CHOLHDL 4 11/28/2019  - Continues fenofibrate without side effects.  He is not on a statin.  3.  Obesity class III -Continue Metformin which is appetite suppressant long-term.  We increased the dose at last visit. -At last visit he weighed 308 pounds.  At this visit, 292 lbs-he lost 16 pounds since last visit! -Discussed about reducing sweets further and eliminating sodas  Orders Placed This Encounter  Procedures  . Microalbumin / creatinine urine ratio   Component     Latest Ref Rng & Units 01/22/2020  Microalb, Ur     0.0 - 1.9 mg/dL 1.6  Creatinine,U     mg/dL 97.4  MICROALB/CREAT RATIO     0.0 - 30.0 mg/g 1.7  Normal  ACR.  Philemon Kingdom, MD PhD [facility] Endocrinology

## 2020-01-22 NOTE — Patient Instructions (Addendum)
Please continue: - Metformin 1000 mg 2x a day with meals  Please return in 4 months with your sugar log.

## 2020-02-10 ENCOUNTER — Other Ambulatory Visit: Payer: Self-pay | Admitting: Family Medicine

## 2020-02-10 MED FILL — LISINOPRIL 20 MG TABLET: 20 | 90 days supply | Qty: 90 | Fill #1

## 2020-02-10 MED FILL — FENOFIBRATE 160 MG TABLET: 160 | 90 days supply | Qty: 90 | Fill #0

## 2020-03-26 IMAGING — MR MRI LUMBAR SPINE WITHOUT CONTRAST
4 of 5 series · 18 of 48 positions shown · non-contrast
Comparison: Abdominal CT 05/13/2018

CLINICAL DATA: Back pain over the last 10 days. Numbness and
tingling in both legs.

EXAM:
MRI LUMBAR SPINE WITHOUT CONTRAST
TECHNIQUE: Multiplanar, multisequence MR imaging of the lumbar spine was
performed. No intravenous contrast was administered.

[Series 4: T1 · sagittal · 4.0mm · 0.53mm/px · 3 of 14 slices shown (1 of 2)]
[im 1/14]
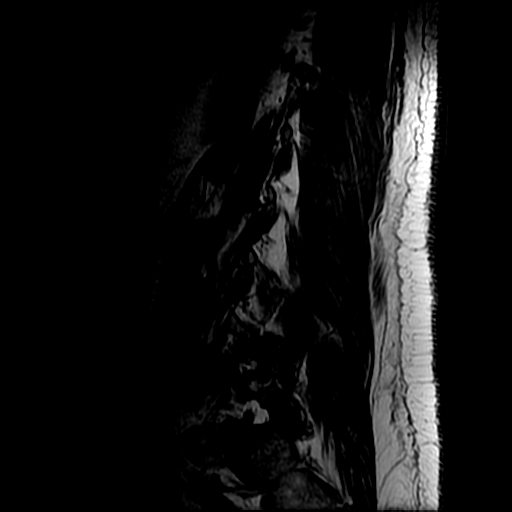
[im 9/14]
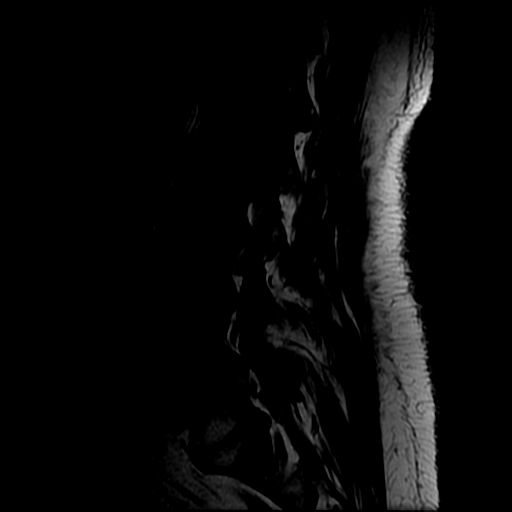
[im 14/14]
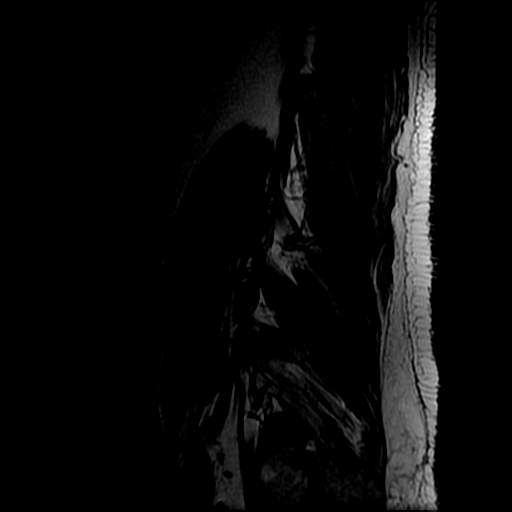

[Series 5: T2 post-contrast · sagittal · 4.0mm · 0.55mm/px · 5 of 14 slices shown]
[im 1/14]
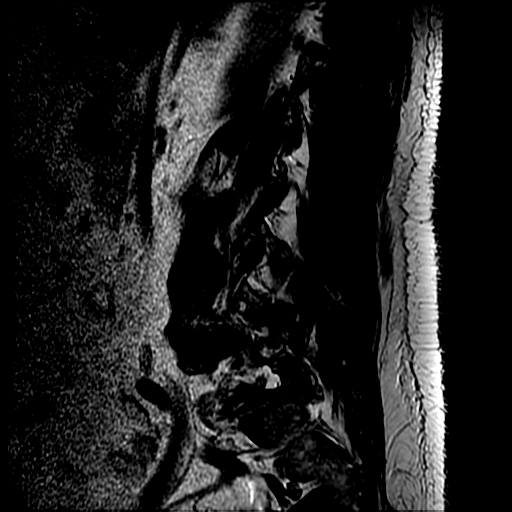
[im 4/14]
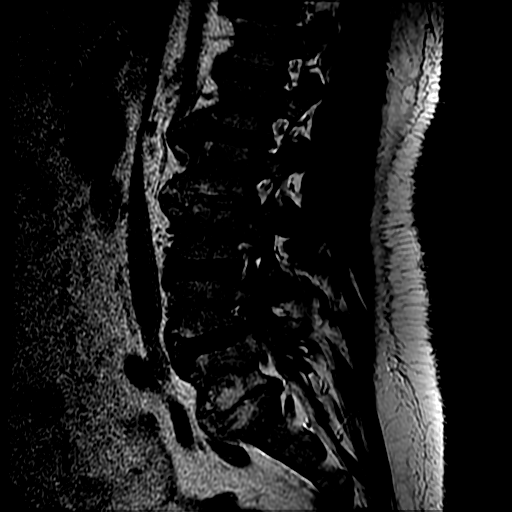
[im 7/14]
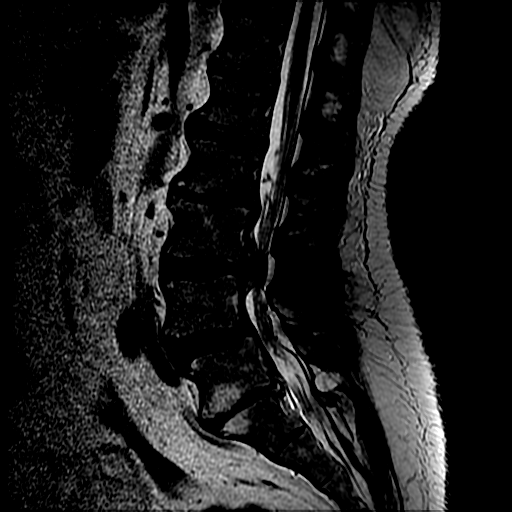
[im 10/14]
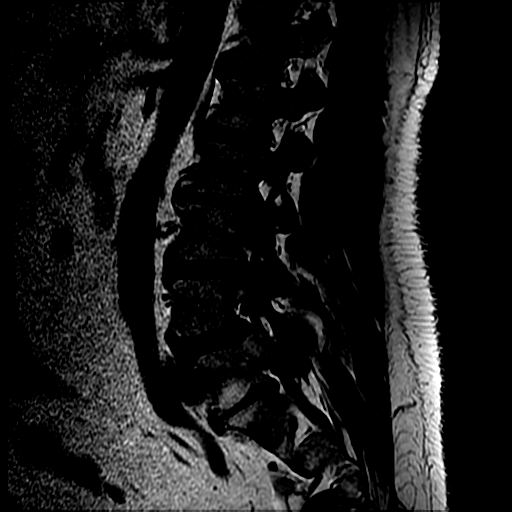
[im 14/14]
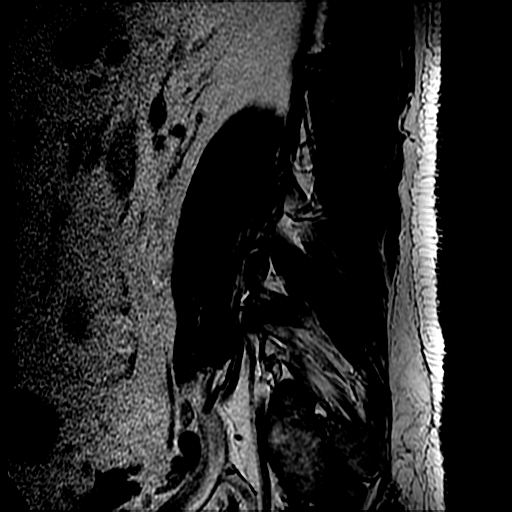

[Series 7: T2 · axial · 4.0mm · 0.41mm/px · z∈[-210,+4]mm · 7 of 47 slices shown]
[im 3/47]
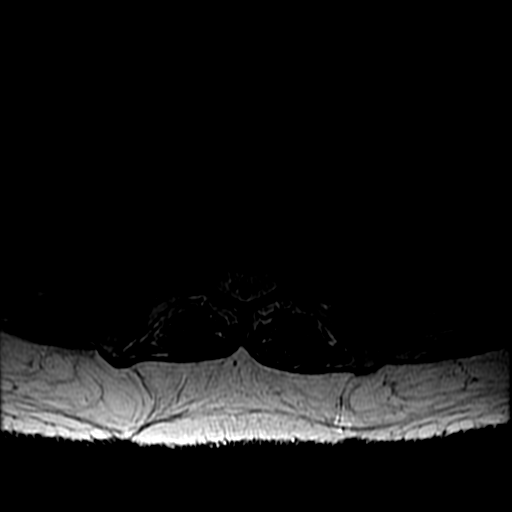
[im 6/47]
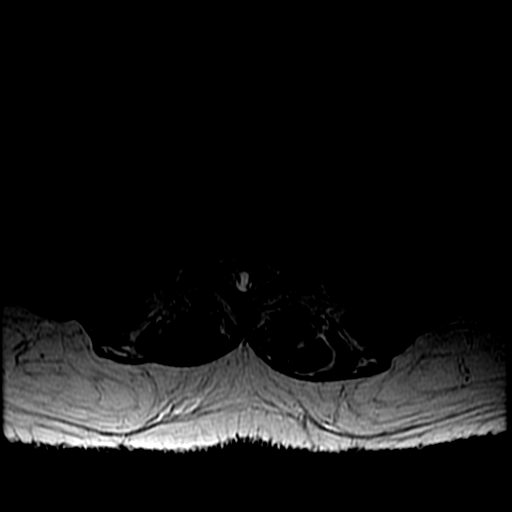
[im 9/47]
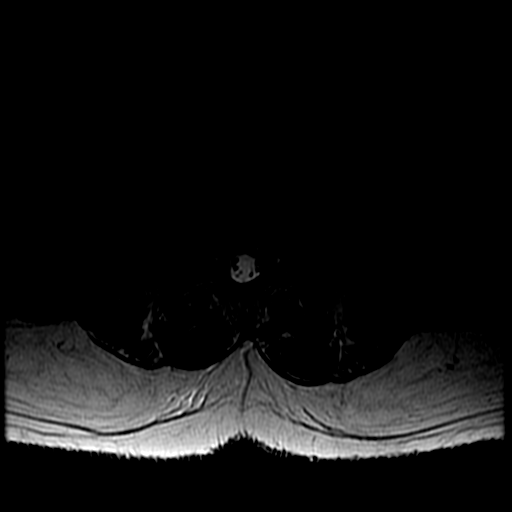
[im 15/47]
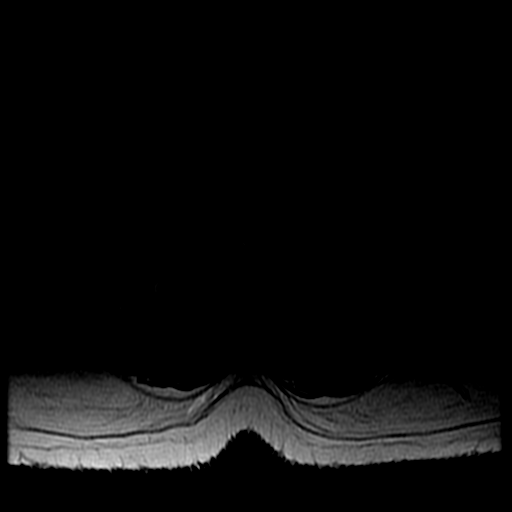
[im 21/47]
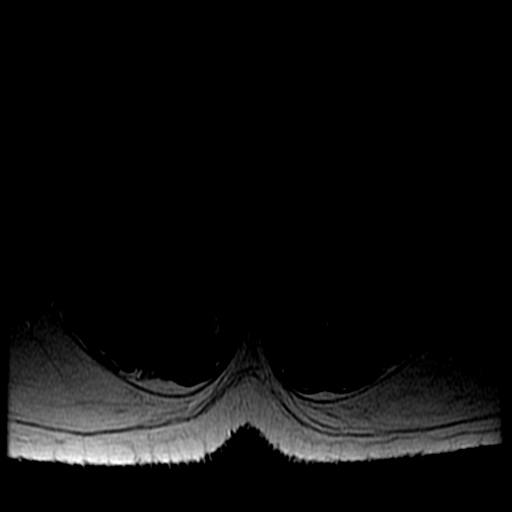
[im 24/47]
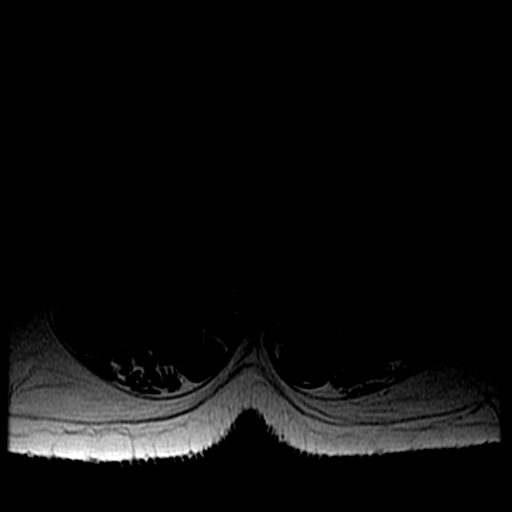
[im 41/47]
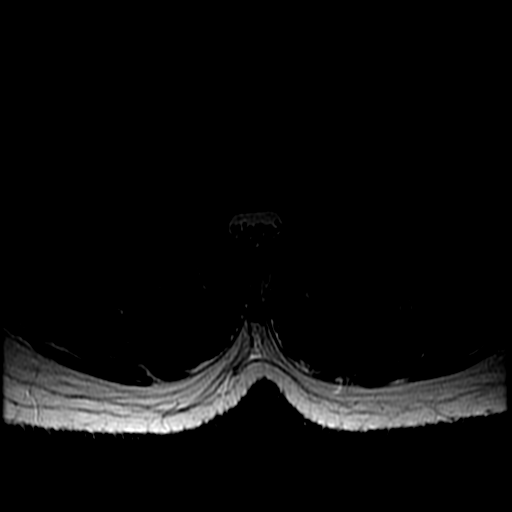

[Series 8: T1 · axial · 4.0mm · 0.41mm/px · z∈[-195,+4]mm · 3 of 47 slices shown (2 of 2)]
[im 6/47]
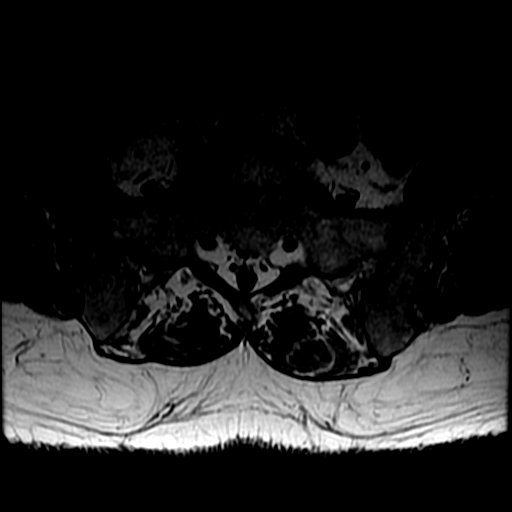
[im 24/47]
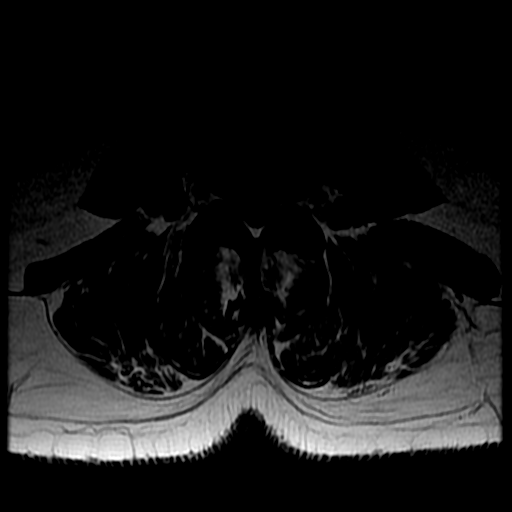
[im 41/47]
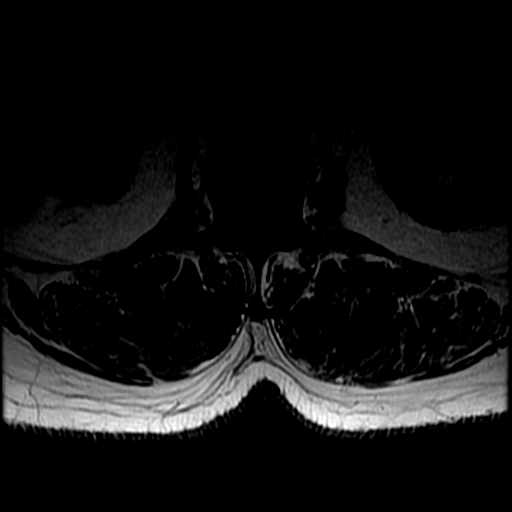

[18 of 48 positions shown; findings below may reference images not displayed]

FINDINGS: Segmentation:  5 lumbar type vertebral bodies.

Alignment:  4 mm degenerative anterolisthesis L4-5.

Vertebrae: No fracture or primary bone lesion. Chronic discogenic
endplate marrow changes at L5-S1 without edema.

Conus medullaris and cauda equina: Conus extends to the L1 level.
Conus and cauda equina appear normal.

Paraspinal and other soft tissues: Negative

Disc levels:

No significant finding at T11-12, T12-L1 or L1-2.

L2-3: Bulging of the disc more prominent towards the left. Mild
narrowing of the left lateral recess but no visible neural
compression.

L3-4: Broad-based disc herniation with abnormal epidural material
extending up behind L3 and down behind L4 that could represent disc
material, blood or a combination. There is severe spinal stenosis at
this level likely to cause neural compression. The patient also has
some facet degeneration with facet and ligamentous hypertrophy at
this level.

L4-5: Chronic facet arthropathy with 4 mm of anterolisthesis. Mild
bulging of the disc. No compressive central canal stenosis.
Subarticular lateral recess and foraminal narrowing that could be
symptomatic. The facet arthropathy could also be a cause of back
pain or referred facet syndrome pain. This appearance would likely
worsen with standing or flexion.

L5-S1: Chronic disc degeneration with loss of disc height. Endplate
osteophytes and bulging of the disc. No compressive canal or
foraminal stenosis.
IMPRESSION: L3-4: Broad-based acute disc herniation. Abnormal material in the
ventral epidural space extending up behind L3 and down behind L4
which could be extruded disc material, epidural hematoma or both.
There is facet degeneration and hypertrophy at this level. These
factors combine to result in severe spinal stenosis that could cause
neural compression on either or both sides.

L4-5: Advanced chronic facet arthropathy with 4 mm of
anterolisthesis. Bulging of the disc. Stenosis of the lateral
recesses and foramina that could be symptomatic. This appearance
could worsen with standing or flexion.

## 2020-04-27 MED FILL — LISINOPRIL 20 MG TABLET: 20 | 90 days supply | Qty: 90 | Fill #2

## 2020-04-27 MED FILL — metFORMIN HCL 1000 MG TABS: 1000 | 90 days supply | Qty: 180 | Fill #2

## 2020-04-27 MED FILL — FENOFIBRATE 160 MG TABLET: 160 | 90 days supply | Qty: 90 | Fill #1

## 2020-05-21 IMAGING — RF DG C-ARM 61-120 MIN
1 series · 2 of 2 positions shown · non-contrast
Comparison: 02/07/2019, MRI 12/13/2018

CLINICAL DATA: L3 through 5 PLIF

EXAM:
LUMBAR SPINE - 2-3 VIEW; DG C-ARM 61-120 MIN

[Series 1: run · 2 of 2 slices shown]
[im 1/2]
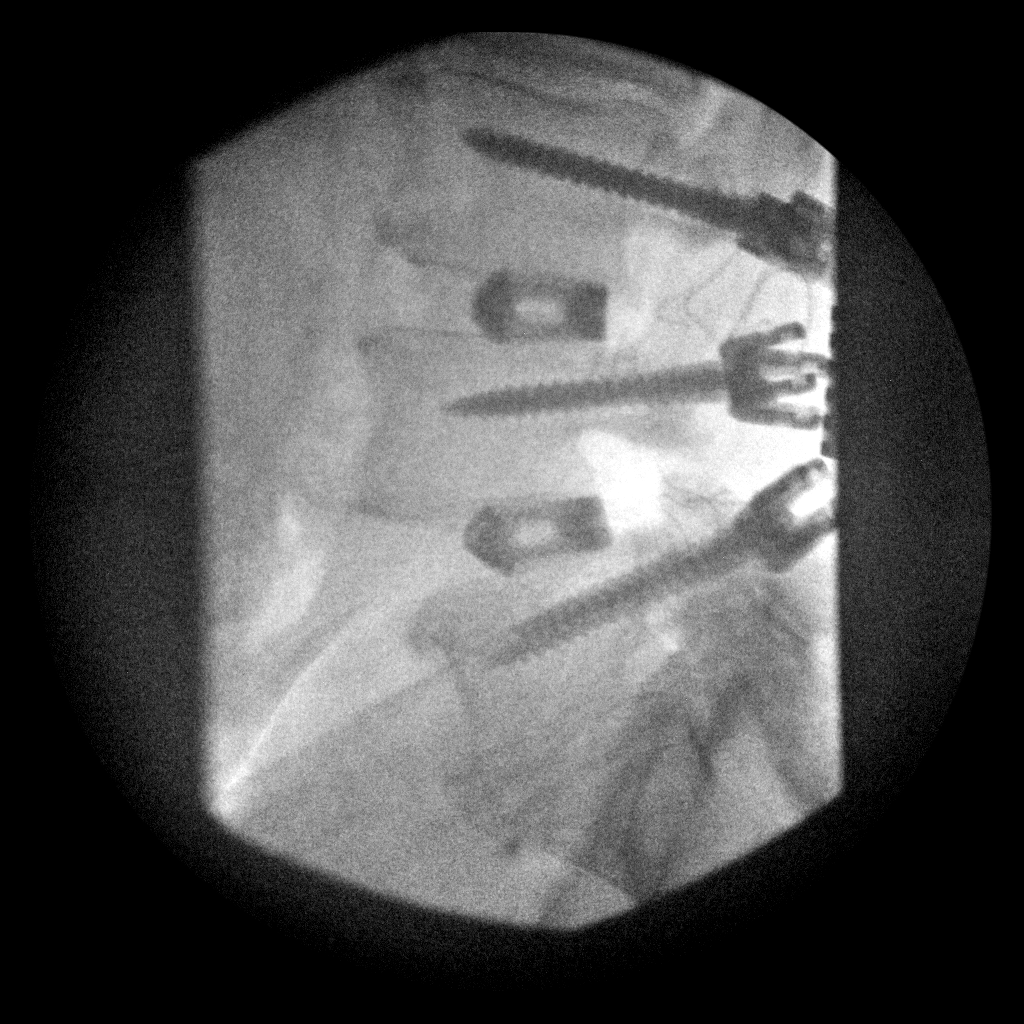
[im 2/2]
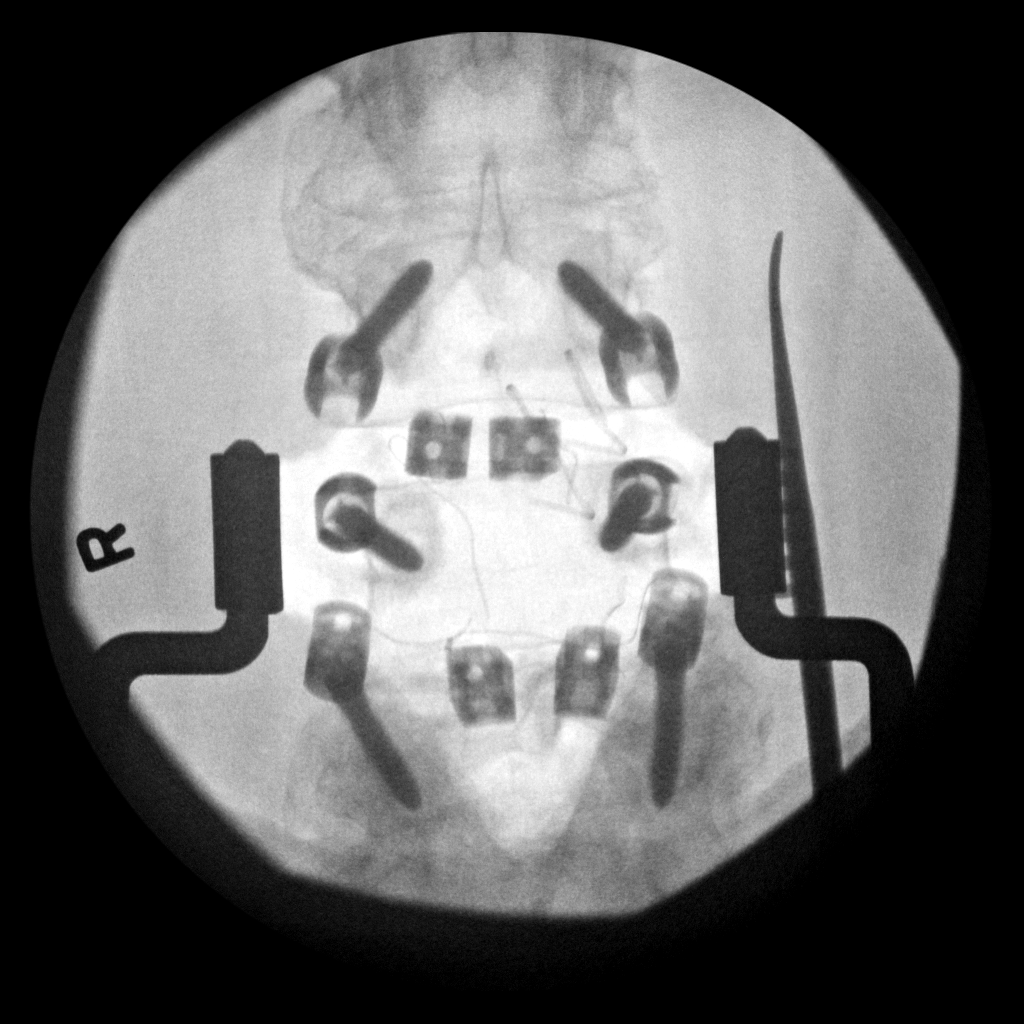

[2 of 2 positions shown; findings below may reference images not displayed]

FINDINGS: Two low resolution intraoperative spot views of the lumbar spine.
Total fluoroscopy time was 1 minutes 13 seconds. Fixating screws
from L3 through L5 with interbody device at L3-L4 and L4-L5.
IMPRESSION: Intraoperative fluoroscopic assistance provided during lumbar spine
surgery.

## 2020-05-26 ENCOUNTER — Other Ambulatory Visit: Payer: Self-pay

## 2020-05-27 ENCOUNTER — Other Ambulatory Visit: Payer: Self-pay | Admitting: Family Medicine

## 2020-05-27 ENCOUNTER — Ambulatory Visit (INDEPENDENT_AMBULATORY_CARE_PROVIDER_SITE_OTHER): Payer: Medicare Other | Admitting: Family Medicine

## 2020-05-27 ENCOUNTER — Encounter: Payer: Self-pay | Admitting: Family Medicine

## 2020-05-27 VITALS — BP 154/82 | HR 100 | Temp 98.7°F | Ht 71.0 in | Wt 298.2 lb

## 2020-05-27 DIAGNOSIS — Z23 Encounter for immunization: Secondary | ICD-10-CM | POA: Diagnosis not present

## 2020-05-27 DIAGNOSIS — I10 Essential (primary) hypertension: Secondary | ICD-10-CM | POA: Diagnosis not present

## 2020-05-27 DIAGNOSIS — M25473 Effusion, unspecified ankle: Secondary | ICD-10-CM | POA: Insufficient documentation

## 2020-05-27 DIAGNOSIS — H6123 Impacted cerumen, bilateral: Secondary | ICD-10-CM

## 2020-05-27 MED ORDER — HYDROCHLOROTHIAZIDE 25 MG PO TABS: 25.0000 mg | ORAL_TABLET | Freq: Every day | ORAL | 3 refills | Status: DC

## 2020-05-27 MED FILL — HYDROCHLOROTHIAZIDE 25 MG T: 25 | 90 days supply | Qty: 90 | Fill #0

## 2020-05-27 NOTE — Progress Notes (Signed)
   Subjective:    Patient ID: Jermaine Mercy., male    DOB: [birth year], 64 y.o.   MRN: [id number]  HPI Here for several issues. First his BP has been running high at home for several months, usually in the 140s over 90s. No chest pain or SOB. He has been retaining fluid however, and both ankles have been swelling. Also since her went to the beach and swam in a swimming pool in June, he has had intermittent hearing loss in the left ear along with pressure and "popping sounds". No ear pain.    Review of Systems  Constitutional: Negative.   HENT: Positive for hearing loss. Negative for congestion, ear discharge, ear pain and sore throat.   Eyes: Negative.   Respiratory: Negative.   Cardiovascular: Positive for leg swelling. Negative for chest pain and palpitations.       Objective:   Physical Exam Constitutional:      Appearance: Normal appearance. He is not ill-appearing.  HENT:     Right Ear: Tympanic membrane and external ear normal.     Left Ear: There is impacted cerumen.     Nose: Nose normal.     Mouth/Throat:     Pharynx: Oropharynx is clear.  Eyes:     Conjunctiva/sclera: Conjunctivae normal.  Cardiovascular:     Rate and Rhythm: Normal rate and regular rhythm.     Pulses: Normal pulses.     Heart sounds: Normal heart sounds.  Pulmonary:     Effort: Pulmonary effort is normal.     Breath sounds: Normal breath sounds.  Musculoskeletal:     Comments: 2+ edema in both ankles   Lymphadenopathy:     Cervical: No cervical adenopathy.  Neurological:     Mental Status: He is alert.           Assessment & Plan:  For the HTN and the ankle edema, we will add HCTZ 25 mg daily to his regimen. For the cerumen impaction, we attempted to irrigate this with water but were unsuccessful. We will refer him to ENT to have this removed.  Alysia Penna, MD

## 2020-05-27 NOTE — Addendum Note (Signed)
Addended by: Matilde Sprang on: 05/27/2020 01:35 PM   Modules accepted: Orders

## 2020-05-28 ENCOUNTER — Ambulatory Visit (INDEPENDENT_AMBULATORY_CARE_PROVIDER_SITE_OTHER): Payer: Medicare Other | Admitting: Internal Medicine

## 2020-05-28 ENCOUNTER — Other Ambulatory Visit: Payer: Self-pay

## 2020-05-28 ENCOUNTER — Encounter: Payer: Self-pay | Admitting: Internal Medicine

## 2020-05-28 VITALS — BP 160/90 | HR 82 | Ht 71.0 in | Wt 295.6 lb

## 2020-05-28 DIAGNOSIS — E669 Obesity, unspecified: Secondary | ICD-10-CM

## 2020-05-28 DIAGNOSIS — E1165 Type 2 diabetes mellitus with hyperglycemia: Secondary | ICD-10-CM

## 2020-05-28 DIAGNOSIS — I10 Essential (primary) hypertension: Secondary | ICD-10-CM

## 2020-05-28 DIAGNOSIS — E785 Hyperlipidemia, unspecified: Secondary | ICD-10-CM

## 2020-05-28 LAB — POCT GLYCOSYLATED HEMOGLOBIN (HGB A1C): Hemoglobin A1C: 6.7 % — AB (ref 4.0–5.6)

## 2020-05-28 NOTE — Patient Instructions (Addendum)
Please continue: °- Metformin 1000 mg 2x a day with meals ° °Stop sweet drinks! ° °Please return in 4 months with your sugar log.  °

## 2020-05-28 NOTE — Progress Notes (Signed)
Patient ID: Jermaine James., male   DOB: Nov 23, 1955, 64 y.o.   MRN: [id number]   This visit occurred during the SARS-CoV-2 public health emergency.  Safety protocols were in place, including screening questions prior to the visit, additional usage of staff PPE, and extensive cleaning of exam room while observing appropriate contact time as indicated for disinfecting solutions.   HPI: Jermaine James. is a 64 y.o.-year-old male, initially referred by his PCP, Dr. Sarajane Jews, returning for follow-up for DM2, dx in 2012, non-insulin-dependent, uncontrolled, without long-term complications. He is the husband of Jermaine James, who is also my pt. last visit 4 months ago.  Reviewed HbA1c levels: Lab Results  Component Value Date   HGBA1C 6.2 (A) 01/22/2020   HGBA1C 7.6 (A) 10/18/2019   HGBA1C 8.3 (H) 07/18/2019   HGBA1C 7.6 (H) 01/30/2019   HGBA1C 6.8 (H) 02/01/2018   HGBA1C 7.0 (H) 09/02/2016   HGBA1C 6.5 04/20/2016   HGBA1C 6.1 05/12/2015   HGBA1C 6.5 01/16/2014   HGBA1C 7.0 (H) 08/20/2012   HGBA1C 6.5 (H) 07/02/2012   HGBA1C 6.6 (H) 07/05/2011   HGBA1C 6.4 06/23/2010   HGBA1C 6.4 10/30/2009   HGBA1C 6.4 07/24/2009   HGBA1C 6.3 (H) 04/20/2007   Pt is on a regimen of: - Metformin 500 mg 2x a day, with meals >> 1000 mg twice a day  He checks sugars 0-1x a day: - am: n/c (150-180 on labs) >> 110-129 >> 130-150 - 2h after b'fast: n/c >> 120-138, 176 >> n/c - before lunch: n/c >> 115-137, 179, 183 >> n/c - 2h after lunch: n/c >> 126-189, 206 >> <150 - before dinner: n/c >> 105-140, 169 >> n/c - 2h after dinner: n/c >> 157, 167, 239 >> n/c - bedtime: n/c >> 141-187 >> n/c - nighttime: n/c Lowest sugar was 150 >> 105 >> 130 Highest sugar was 180 >> 239 (soda) >> 150  Glucometer: none >> True Focus  Pt's meals are: - Breakfast: cheerios + 1-2% milk; pancake + waffle + bacon;  - Lunch: sandwich - PB/turkey/nan - Dinner: meat + starch + veggies - Snacks: 1-2x a day -  He  reintroduced sodas - 1x a day.  -  no CKD, last BUN/creatinine:  Lab Results  Component Value Date   BUN 15 07/18/2019   BUN 12 05/17/2019   CREATININE 0.91 07/18/2019   CREATININE 0.90 05/17/2019   No MAU: Component     Latest Ref Rng & Units 01/22/2020  Microalb, Ur     0.0 - 1.9 mg/dL 1.6  Creatinine,U     mg/dL 97.4  MICROALB/CREAT RATIO     0.0 - 30.0 mg/g 1.7  On lisinopril 20.  -+ HL; last set of lipids: Lab Results  Component Value Date   CHOL 110 11/28/2019   HDL 30.20 (L) 11/28/2019   LDLCALC 54 11/28/2019   LDLDIRECT 86.0 02/01/2018   TRIG 130.0 11/28/2019   CHOLHDL 4 11/28/2019  On fenofibrate 160.  - last eye exam was in 07/2019: No DR. Artificial eye OS.  Cataract OD.  - no numbness and tingling in his feet.  Pt has no FH of DM.  He also has a history of OSA - on CPAP, gout, HTN - he was restarted on HCTZ at 25 mg daily - yesterday.  ROS: Constitutional: no weight gain/no weight loss, no fatigue, no subjective hyperthermia, no subjective hypothermia Eyes: no blurry vision, no xerophthalmia ENT: no sore throat, no nodules palpated in neck, no dysphagia,  no odynophagia, no hoarseness Cardiovascular: no CP/no SOB/no palpitations/no leg swelling Respiratory: no cough/no SOB/no wheezing Gastrointestinal: no N/no V/no D/no C/no acid reflux Musculoskeletal: no muscle aches/no joint aches Skin: no rashes, no hair loss Neurological: no tremors/no numbness/no tingling/no dizziness  I reviewed pt's medications, allergies, PMH, social hx, family hx, and changes were documented in the history of present illness. Otherwise, unchanged from my initial visit note.  Past Medical History:  Diagnosis Date  . 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 Chronic kidney disease   . 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 Dental crowns present   . Family history of adverse reaction to anesthesia    pt's father has hx. of post-op N/V  . High triglycerides    no  current med.  Marland Kitchen History of gout   . History of kidney stones   . Hypertension    states under control with med., has been on med. ~ 87 yr.  . Non-insulin dependent type 2 diabetes mellitus ([place])   . Obesity, Class III, BMI 40-49.9 (morbid obesity) ([place])   . OSA on CPAP   . Osteoarthritis    knees (09/14/2016)  . Sleep apnea   . 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 cancerous mole removed-back  09/2019  . COLONOSCOPY  08/29/2005   per Dr. Sharlett Iles, diverticulosis only, repeat in 10 yrs   . CYSTOSCOPY  2011   "related to trace blood in urine"  . EYE SURGERY Left 1961   traumatic loss of eye  . INGUINAL HERNIA REPAIR Left   . KNEE ARTHROSCOPY Left 1990s?  Marland Kitchen KNEE ARTHROSCOPY WITH MEDIAL MENISECTOMY Right 11/08/2012   Procedure: RIGHT KNEE ARTHROSCOPY WITH PARTIAL MEDIAL AND LATERAL MENISECTOMIES, CHONDROPLASTY;  Surgeon: [name], MD;  Location: [facility];  Service: Orthopedics;  Laterality: Right;  RIGHT KNEE SCOPE WITH PARTIAL MEDIAL AND LATERAL  MENISCECTOMIES  AND CHONDROPLASTY  . KNEE CLOSED REDUCTION Left 08/18/2016   Procedure: LEFT  MANIPULATION KNEE;  Surgeon: [name], MD;  Location: [facility];  Service: Orthopedics;  Laterality: Left;  . LIPOMA EXCISION Left    upper back  . MOHS SURGERY Left 10/2015   "forehead"  . RETINAL TEAR REPAIR CRYOTHERAPY Right 11/2017  . SPINE SURGERY  02/07/2019  . TOTAL KNEE ARTHROPLASTY Left 05/18/2016   Procedure: LEFT TOTAL KNEE ARTHROPLASTY;  Surgeon: [name], MD;  Location: [facility];  Service: Orthopedics;  Laterality: Left;  . TOTAL KNEE ARTHROPLASTY Right 09/14/2016   Procedure: TOTAL KNEE ARTHROPLASTY MANIPULATION LEFT KNEE;  Surgeon: [name], MD;  Location: [facility];  Service: Orthopedics;  Laterality: Right;   Social History   Socioeconomic History  . Marital status: Married    Spouse name: Not on file  .  Number of children: 3  . Years of education: Not on file  . Highest education level: Not on file  Occupational History  . Occupation: Customer service manager -retired  Tobacco Use  . Smoking status: Never Smoker  . Smokeless tobacco: Never Used  Substance and Sexual Activity  . Alcohol use: Yes    Alcohol/week: 3.0 - 4.0 standard drinks    Types: Beer, wine, mixed  . Drug use: No  . Sexual activity: Yes  Other Topics Concern  . Not on file  Social History Narrative  . Not on file   Social  Determinants of Health   Financial Resource Strain:   . Difficulty of Paying Living Expenses: Not on file  Food Insecurity:   . Worried About Charity fundraiser in the Last Year: Not on file  . Ran Out of Food in the Last Year: Not on file  Transportation Needs:   . Lack of Transportation (Medical): Not on file  . Lack of Transportation (Non-Medical): Not on file  Physical Activity:   . Days of Exercise per Week: Not on file  . Minutes of Exercise per Session: Not on file  Stress:   . Feeling of Stress : Not on file  Social Connections:   . Frequency of Communication with Friends and Family: Not on file  . Frequency of Social Gatherings with Friends and Family: Not on file  . Attends Religious Services: Not on file  . Active Member of Clubs or Organizations: Not on file  . Attends Archivist Meetings: Not on file  . Marital Status: Not on file  Intimate Partner Violence:   . Fear of Current or Ex-Partner: Not on file  . Emotionally Abused: Not on file  . Physically Abused: Not on file  . Sexually Abused: Not on file   Current Outpatient Medications on File Prior to Visit  Medication Sig Dispense Refill  . cyclobenzaprine (FLEXERIL) 10 MG tablet Take 1 tablet (10 mg total) by mouth 3 (three) times daily as needed for muscle spasms. 24 tablet 0  . fenofibrate 160 MG tablet TAKE 1 TABLET (160 MG TOTAL) BY MOUTH DAILY. 90 tablet 3  . fluticasone (FLONASE) 50 MCG/ACT nasal spray Place 1-2  sprays into both nostrils daily for 7 days. (Patient taking differently: Place 1-2 sprays into both nostrils daily as needed. ) 1 g 0  . hydrochlorothiazide (HYDRODIURIL) 25 MG tablet Take 1 tablet (25 mg total) by mouth daily. 90 tablet 3  . lisinopril (ZESTRIL) 20 MG tablet Take 1 tablet (20 mg total) by mouth daily. 90 tablet 3  . metFORMIN (GLUCOPHAGE) 1000 MG tablet TAKE 1 TABLET (1000 MG TOTAL) BY MOUTH 2 TIMES DAILY WITH A MEAL. 180 tablet 3  . sildenafil (VIAGRA) 100 MG tablet Take 1 tablet (100 mg total) by mouth as needed for erectile dysfunction. 10 tablet 11  . [DISCONTINUED] FENOFIBRATE PO Take by mouth.     No current facility-administered medications on file prior to visit.   No Known Allergies Family History  Problem Relation Age of Onset  . Anesthesia problems Father        post-op N/V  . Colon cancer Neg Hx   . Esophageal cancer Neg Hx   . Rectal cancer Neg Hx   . Stomach cancer Neg Hx    PE: BP (!) 160/90   Pulse 82   Ht 5\' 11"  (1.803 m)   Wt 295 lb 9.6 oz (134.1 kg)   SpO2 97%   BMI 41.23 kg/m  Wt Readings from Last 3 Encounters:  05/28/20 295 lb 9.6 oz (134.1 kg)  05/27/20 298 lb 3.2 oz (135.3 kg)  01/22/20 292 lb (132.5 kg)   Constitutional: overweight, in NAD Eyes: PERRLA, EOMI, no exophthalmos ENT: moist mucous membranes, no thyromegaly, no cervical lymphadenopathy Cardiovascular: RRR, No MRG Respiratory: CTA B Gastrointestinal: abdomen soft, NT, ND, BS+ Musculoskeletal: no deformities, strength intact in all 4 Skin: moist, warm, no rashes Neurological: no tremor with outstretched hands, DTR normal in all 4  ASSESSMENT: 1. DM2, non-insulin-dependent, uncontrolled, without long-term complications, but with hyperglycemia  2. HL  3.  Obesity class III  4. HTN  PLAN:  1. Patient with longstanding, uncontrolled, type 2 diabetes, on oral antidiabetic regimen with Metformin only, with improved control at last visit, to an HbA1c of 6.2%.  At that  time, sugars are almost all at goal with only few hyperglycemic exceptions with dietary indiscretions, but improving.  His sugars were increasing after cereals in the morning and we discussed about healthier options for breakfast.  He had no lows.  Overall, the control was much better so we did not change the regimen. -At present, above target. However, even though he is not checking frequently later in the day, sugars are less fluctuating, and he did not think he saw blood sugars higher than 150 since last visit.  There is no significant improvement from before.  I believe for now, if he does stop sweet drinks and start checking his sugars consistently, we can just continue the current regimen.  At next visit, though, he may need escalation of treatment -I suggested to: Patient Instructions  Please continue: - Metformin 1000 mg 2x a day with meals  Stop sweet drinks!  Please return in 4 months with your sugar log.   - we checked his HbA1c: 6.7% (slightly higher) - advised to check sugars at different times of the day - 1x a day, rotating check times - advised for yearly eye exams >> he is UTD - return to clinic in 4 months  2. HL -Reviewed latest lipid panel from 11/2019: LDL at goal, HDL slightly low: Lab Results  Component Value Date   CHOL 110 11/28/2019   HDL 30.20 (L) 11/28/2019   LDLCALC 54 11/28/2019   LDLDIRECT 86.0 02/01/2018   TRIG 130.0 11/28/2019   CHOLHDL 4 11/28/2019  -Continues fenofibrate 160 without side effects.  He is not on a statin.  3.  Obesity class III -Continues Metformin which is appetite suppressant long-term -He lost 16 pounds before last visit -We discussed about reducing sweets further and eliminating sodas at last visit -However, he is still drinking 1 regular soda a day now-again advised to stop -He gained 3 pounds since last visit  4. HTN -Patient's blood pressure regimen was changed yesterday by PCP: HCTZ was restarted at a higher dose, 25 mg  daily -At today's visit, blood pressure is high, at 160/90 -Upon questioning, he is taking HCTZ in the evening >> I advised him to move this to the morning  No orders of the defined types were placed in this encounter.  Philemon Kingdom, MD PhD [facility]

## 2020-05-28 NOTE — Addendum Note (Signed)
Addended by: Caprice Beaver T on: 05/28/2020 11:03 AM   Modules accepted: Orders

## 2020-08-10 MED FILL — LISINOPRIL 20 MG TABLET: 20 | 90 days supply | Qty: 90 | Fill #3

## 2020-08-10 MED FILL — FENOFIBRATE 160 MG TABLET: 160 | 90 days supply | Qty: 90 | Fill #2

## 2020-08-10 MED FILL — HYDROCHLOROTHIAZIDE 25 MG T: 25 | 90 days supply | Qty: 90 | Fill #1

## 2020-08-10 MED FILL — METFORMIN HCL 1000 MG TABS: 1000 | 90 days supply | Qty: 180 | Fill #3

## 2020-09-18 LAB — HM DIABETES EYE EXAM

## 2020-09-29 ENCOUNTER — Encounter: Payer: Self-pay | Admitting: Internal Medicine

## 2020-09-29 ENCOUNTER — Other Ambulatory Visit: Payer: Self-pay

## 2020-09-29 ENCOUNTER — Ambulatory Visit (INDEPENDENT_AMBULATORY_CARE_PROVIDER_SITE_OTHER): Payer: Medicare Other | Admitting: Internal Medicine

## 2020-09-29 VITALS — BP 140/88 | HR 96 | Ht 71.0 in | Wt 299.6 lb

## 2020-09-29 DIAGNOSIS — E1165 Type 2 diabetes mellitus with hyperglycemia: Secondary | ICD-10-CM

## 2020-09-29 DIAGNOSIS — E669 Obesity, unspecified: Secondary | ICD-10-CM | POA: Diagnosis not present

## 2020-09-29 DIAGNOSIS — E785 Hyperlipidemia, unspecified: Secondary | ICD-10-CM | POA: Diagnosis not present

## 2020-09-29 LAB — POCT GLYCOSYLATED HEMOGLOBIN (HGB A1C): Hemoglobin A1C: 6.5 % — AB (ref 4.0–5.6)

## 2020-09-29 LAB — COMPREHENSIVE METABOLIC PANEL
ALT: 21 U/L (ref 0–53)
AST: 24 U/L (ref 0–37)
Albumin: 4.1 g/dL (ref 3.5–5.2)
Alkaline Phosphatase: 43 U/L (ref 39–117)
BUN: 16 mg/dL (ref 6–23)
CO2: 28 mEq/L (ref 19–32)
Calcium: 9.5 mg/dL (ref 8.4–10.5)
Chloride: 101 mEq/L (ref 96–112)
Creatinine, Ser: 0.96 mg/dL (ref 0.40–1.50)
GFR: 82.44 mL/min (ref >60.00)
Glucose, Bld: 116 mg/dL — ABNORMAL HIGH (ref 70–99)
Potassium: 4.2 mEq/L (ref 3.5–5.1)
Sodium: 135 mEq/L (ref 135–145)
Total Bilirubin: 0.3 mg/dL (ref 0.2–1.2)
Total Protein: 8.7 g/dL — ABNORMAL HIGH (ref 6.0–8.3)

## 2020-09-29 LAB — LIPID PANEL
Cholesterol: 107 mg/dL (ref 0–200)
HDL: 38.2 mg/dL — ABNORMAL LOW (ref >39.00)
LDL Cholesterol: 47 mg/dL (ref 0–99)
NonHDL: 69.15
Total CHOL/HDL Ratio: 3
Triglycerides: 110 mg/dL (ref 0.0–149.0)
VLDL: 22 mg/dL (ref 0.0–40.0)

## 2020-09-29 LAB — MICROALBUMIN / CREATININE URINE RATIO
Creatinine,U: 148.6 mg/dL
Microalb Creat Ratio: 0.8 mg/g (ref 0.0–30.0)
Microalb, Ur: 1.2 mg/dL (ref 0.0–1.9)

## 2020-09-29 NOTE — Patient Instructions (Signed)
Please continue: °- Metformin 1000 mg 2x a day with meals ° °Stop sweet drinks! ° °Please return in 4 months with your sugar log.  °

## 2020-09-29 NOTE — Progress Notes (Signed)
Patient ID: Jermaine James., male   DOB: 05-27-1956, 65 y.o.   MRN: [id number]   This visit occurred during the SARS-CoV-2 public health emergency.  Safety protocols were in place, including screening questions prior to the visit, additional usage of staff PPE, and extensive cleaning of exam room while observing appropriate contact time as indicated for disinfecting solutions.   HPI: Jermaine James. is a 65 y.o.-year-old male, initially referred by his PCP, Dr. Sarajane Jews, returning for follow-up for DM2, dx in 2012, non-insulin-dependent, uncontrolled, without long-term complications. He is the husband of Jermaine James, who is also my pt.  Last visit 4 months ago.  Reviewed HbA1c level: Lab Results  Component Value Date   HGBA1C 6.7 (A) 05/28/2020   HGBA1C 6.2 (A) 01/22/2020   HGBA1C 7.6 (A) 10/18/2019   HGBA1C 8.3 (H) 07/18/2019   HGBA1C 7.6 (H) 01/30/2019   HGBA1C 6.8 (H) 02/01/2018   HGBA1C 7.0 (H) 09/02/2016   HGBA1C 6.5 04/20/2016   HGBA1C 6.1 05/12/2015   HGBA1C 6.5 01/16/2014   HGBA1C 7.0 (H) 08/20/2012   HGBA1C 6.5 (H) 07/02/2012   HGBA1C 6.6 (H) 07/05/2011   HGBA1C 6.4 06/23/2010   HGBA1C 6.4 10/30/2009   HGBA1C 6.4 07/24/2009   HGBA1C 6.3 (H) 04/20/2007   Pt is on a regimen of: - Metformin 500 mg 2x a day, with meals >> 1000 mg twice a day  He checks sugars 0 to once a day: - am: n/c (150-180 on labs) >> 110-129 >> 130-150 >> 120-126, 134 - 2h after b'fast: n/c >> 120-138, 176 >> n/c >> 110-142 - before lunch: n/c >> 115-137, 179, 183 >> n/c >> 110-140 - 2h after lunch: n/c >> 126-189, 206 >> <150 >> 138-162, 175 - before dinner: n/c >> 105-140, 169 >> n/c >> 98-133, 158 - 2h after dinner: n/c >> 157, 167, 239 >> n/c >> 142-177 - bedtime: n/c >> 141-187 >> n/c >> 119-213, 239 - nighttime: n/c Lowest sugar was 150 >> 105 >> 130 >> 98 Highest sugar was 180 >> 239 (soda) >> 150 >> 239 (pizza, Soda)  Glucometer: none >> True Focus  Pt's meals are: - Breakfast:  cheerios + 1-2% milk; pancake + waffle + bacon;  - Lunch: sandwich - PB/turkey/nan - Dinner: meat + starch + veggies - Snacks: 1-2x a day -  At last visit he was still drinking 1 soda a day-advised him to stop >> still drinks this.  -No CKD, last BUN/creatinine:  Lab Results  Component Value Date   BUN 15 07/18/2019   BUN 12 05/17/2019   CREATININE 0.91 07/18/2019   CREATININE 0.90 05/17/2019   No MAU: Component     Latest Ref Rng & Units 01/22/2020  Microalb, Ur     0.0 - 1.9 mg/dL 1.6  Creatinine,U     mg/dL 97.4  MICROALB/CREAT RATIO     0.0 - 30.0 mg/g 1.7  On lisinopril 20.  -+ HL; last set of lipids: Lab Results  Component Value Date   CHOL 110 11/28/2019   HDL 30.20 (L) 11/28/2019   LDLCALC 54 11/28/2019   LDLDIRECT 86.0 02/01/2018   TRIG 130.0 11/28/2019   CHOLHDL 4 11/28/2019  On fenofibrate 160. He did not try statins.  - last eye exam was in 08/2020: No DR. Artificial eye OS.  + Cataract OD >> will have sx.  - no numbness and tingling in his feet.  Pt has no FH of DM.  He also has a history  of OSA-on CPAP, gout, HTN.  ROS: Constitutional: no weight gain/no weight loss, no fatigue, no subjective hyperthermia, no subjective hypothermia Eyes: no blurry vision, no xerophthalmia ENT: no sore throat, no nodules palpated in neck, no dysphagia, no odynophagia, no hoarseness Cardiovascular: no CP/no SOB/no palpitations/no leg swelling Respiratory: no cough/no SOB/no wheezing Gastrointestinal: no N/no V/no D/no C/no acid reflux Musculoskeletal: no muscle aches/no joint aches Skin: no rashes, no hair loss Neurological: no tremors/no numbness/no tingling/no dizziness  I reviewed pt's medications, allergies, PMH, social hx, family hx, and changes were documented in the history of present illness. Otherwise, unchanged from my initial visit note.  Past Medical History:  Diagnosis Date  . 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 Chronic kidney  disease   . 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 Dental crowns present   . Family history of adverse reaction to anesthesia    pt's father has hx. of post-op N/V  . High triglycerides    no current med.  Marland Kitchen History of gout   . History of kidney stones   . Hypertension    states under control with med., has been on med. ~ 51 yr.  . Non-insulin dependent type 2 diabetes mellitus ([place])   . Obesity, Class III, BMI 40-49.9 (morbid obesity) ([place])   . OSA on CPAP   . Osteoarthritis    knees (09/14/2016)  . Sleep apnea   . 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 cancerous mole removed-back  09/2019  . COLONOSCOPY  08/29/2005   per Dr. Sharlett Iles, diverticulosis only, repeat in 10 yrs   . CYSTOSCOPY  2011   "related to trace blood in urine"  . EYE SURGERY Left 1961   traumatic loss of eye  . INGUINAL HERNIA REPAIR Left   . KNEE ARTHROSCOPY Left 1990s?  Marland Kitchen KNEE ARTHROSCOPY WITH MEDIAL MENISECTOMY Right 11/08/2012   Procedure: RIGHT KNEE ARTHROSCOPY WITH PARTIAL MEDIAL AND LATERAL MENISECTOMIES, CHONDROPLASTY;  Surgeon: [name], MD;  Location: [facility];  Service: Orthopedics;  Laterality: Right;  RIGHT KNEE SCOPE WITH PARTIAL MEDIAL AND LATERAL  MENISCECTOMIES  AND CHONDROPLASTY  . KNEE CLOSED REDUCTION Left 08/18/2016   Procedure: LEFT  MANIPULATION KNEE;  Surgeon: [name], MD;  Location: [facility];  Service: Orthopedics;  Laterality: Left;  . LIPOMA EXCISION Left    upper back  . MOHS SURGERY Left 10/2015   "forehead"  . RETINAL TEAR REPAIR CRYOTHERAPY Right 11/2017  . SPINE SURGERY  02/07/2019  . TOTAL KNEE ARTHROPLASTY Left 05/18/2016   Procedure: LEFT TOTAL KNEE ARTHROPLASTY;  Surgeon: [name], MD;  Location: [facility];  Service: Orthopedics;  Laterality: Left;  . TOTAL KNEE ARTHROPLASTY Right 09/14/2016   Procedure: TOTAL KNEE ARTHROPLASTY MANIPULATION LEFT KNEE;   Surgeon: [name], MD;  Location: [facility];  Service: Orthopedics;  Laterality: Right;   Social History   Socioeconomic History  . Marital status: Married    Spouse name: Not on file  . Number of children: 3  . Years of education: Not on file  . Highest education level: Not on file  Occupational History  . Occupation: Customer service manager -retired  Tobacco Use  . Smoking status: Never Smoker  . Smokeless tobacco: Never Used  Substance and Sexual Activity  . Alcohol use: Yes    Alcohol/week: 3.0 - 4.0 standard drinks  Types: Beer, wine, mixed  . Drug use: No  . Sexual activity: Yes  Other Topics Concern  . Not on file  Social History Narrative  . Not on file   Social Determinants of Health   Financial Resource Strain:   . Difficulty of Paying Living Expenses: Not on file  Food Insecurity:   . Worried About Charity fundraiser in the Last Year: Not on file  . Ran Out of Food in the Last Year: Not on file  Transportation Needs:   . Lack of Transportation (Medical): Not on file  . Lack of Transportation (Non-Medical): Not on file  Physical Activity:   . Days of Exercise per Week: Not on file  . Minutes of Exercise per Session: Not on file  Stress:   . Feeling of Stress : Not on file  Social Connections:   . Frequency of Communication with Friends and Family: Not on file  . Frequency of Social Gatherings with Friends and Family: Not on file  . Attends Religious Services: Not on file  . Active Member of Clubs or Organizations: Not on file  . Attends Archivist Meetings: Not on file  . Marital Status: Not on file  Intimate Partner Violence:   . Fear of Current or Ex-Partner: Not on file  . Emotionally Abused: Not on file  . Physically Abused: Not on file  . Sexually Abused: Not on file   Current Outpatient Medications on File Prior to Visit  Medication Sig Dispense Refill  . cyclobenzaprine (FLEXERIL) 10 MG tablet Take 1 tablet (10 mg total) by mouth 3 (three) times  daily as needed for muscle spasms. 24 tablet 0  . fenofibrate 160 MG tablet TAKE 1 TABLET (160 MG TOTAL) BY MOUTH DAILY. 90 tablet 3  . fluticasone (FLONASE) 50 MCG/ACT nasal spray Place 1-2 sprays into both nostrils daily for 7 days. (Patient taking differently: Place 1-2 sprays into both nostrils daily as needed. ) 1 g 0  . hydrochlorothiazide (HYDRODIURIL) 25 MG tablet Take 1 tablet (25 mg total) by mouth daily. 90 tablet 3  . lisinopril (ZESTRIL) 20 MG tablet Take 1 tablet (20 mg total) by mouth daily. 90 tablet 3  . metFORMIN (GLUCOPHAGE) 1000 MG tablet TAKE 1 TABLET (1000 MG TOTAL) BY MOUTH 2 TIMES DAILY WITH A MEAL. 180 tablet 3  . sildenafil (VIAGRA) 100 MG tablet Take 1 tablet (100 mg total) by mouth as needed for erectile dysfunction. 10 tablet 11  . [DISCONTINUED] FENOFIBRATE PO Take by mouth.     No current facility-administered medications on file prior to visit.   No Known Allergies Family History  Problem Relation Age of Onset  . Anesthesia problems Father        post-op N/V  . Colon cancer Neg Hx   . Esophageal cancer Neg Hx   . Rectal cancer Neg Hx   . Stomach cancer Neg Hx    PE: BP 140/88   Pulse 96   Ht 5\' 11"  (1.803 m)   Wt 299 lb 9.6 oz (135.9 kg)   SpO2 96%   BMI 41.79 kg/m  Wt Readings from Last 3 Encounters:  09/29/20 299 lb 9.6 oz (135.9 kg)  05/28/20 295 lb 9.6 oz (134.1 kg)  05/27/20 298 lb 3.2 oz (135.3 kg)   Constitutional: overweight, in NAD Eyes: PERRLA, EOMI, no exophthalmos ENT: moist mucous membranes, no thyromegaly, no cervical lymphadenopathy Cardiovascular: RRR, No MRG Respiratory: CTA B Gastrointestinal: abdomen soft, NT, ND, BS+ Musculoskeletal:  no deformities, strength intact in all 4 Skin: moist, warm, no rashes Neurological: no tremor with outstretched hands, DTR normal in all 4  ASSESSMENT: 1. DM2, non-insulin-dependent, uncontrolled, without long-term complications, but with hyperglycemia  2. HL  3.  Obesity class  III  PLAN:  1. Patient with longstanding, uncontrolled, type 2 diabetes, on oral antidiabetic regimen with Metformin only, with improved control in the last year.  In the past, he was having high blood sugars after cereals in the morning and we discussed about healthier options for breakfast.  At last visit, he was not checking frequently later in the day and I advised him to try to rotate the checks more.  However, sugars were less fluctuating, lower than 150.  We discussed about stopping sodas, which she was still drinking but otherwise we did not change his regimen.  HbA1c was slightly higher, at 6.7%. -At today's visit, sugars are at or slightly above goal, with few exceptions, when he is having dietary indiscretions including pizza, snacks, sodas. He mentions that he is ready to start working on eliminating these. I suggested the glipizide tablet before a larger meal (for example, he would not want to add another medication to his regimen). For now, we will continue Metformin. -I suggested to: Patient Instructions  Please continue: - Metformin 1000 mg 2x a day with meals  Stop sweet drinks!  Please return in 4 months with your sugar log.   - we checked his HbA1c: 6.5% (lower) - advised to check sugars at different times of the day - 1x a day, rotating check times - advised for yearly eye exams >> he is UTD - will check annual labs - return to clinic in 4 months  2. HL -Reviewed latest lipid panel from 11/2019: LDL at goal, HDL slightly low: Lab Results  Component Value Date   CHOL 110 11/28/2019   HDL 30.20 (L) 11/28/2019   LDLCALC 54 11/28/2019   LDLDIRECT 86.0 02/01/2018   TRIG 130.0 11/28/2019   CHOLHDL 4 11/28/2019  -He is not on a statin but takes fenofibrate 160 mg daily without side effects -We will repeat his lipid panel today-he is fasting -We did discuss about possibly adding a statin to his regimen but he would like to avoid adding another medication since he feels  that he is taking too many already. Explained the benefits of statins. Will readdress after the results return  3.  Obesity class III -Continues Metformin which is appetite suppressant long-term -Before last visit, he gained 3 pounds, but previously lost 16 pounds -At last visit he was still drinking sodas and I strongly advised him to stop. He did not stop but mentions that he is not drinking these every day. We again discussed about the importance of stopping them completely. - he gained 4 lbs since last OV  Component     Latest Ref Rng & Units 09/29/2020  Sodium     135 - 145 mEq/L 135  Potassium     3.5 - 5.1 mEq/L 4.2  Chloride     96 - 112 mEq/L 101  CO2     19 - 32 mEq/L 28  Glucose     70 - 99 mg/dL 116 (H)  BUN     6 - 23 mg/dL 16  Creatinine     0.40 - 1.50 mg/dL 0.96  Total Bilirubin     0.2 - 1.2 mg/dL 0.3  Alkaline Phosphatase     39 - 117 U/L 43  AST     0 - 37 U/L 24  ALT     0 - 53 U/L 21  Total Protein     6.0 - 8.3 g/dL 8.7 (H)  Albumin     3.5 - 5.2 g/dL 4.1  GFR     >60.00 mL/min 82.44  Calcium     8.4 - 10.5 mg/dL 9.5  Cholesterol     0 - 200 mg/dL 107  Triglycerides     0.0 - 149.0 mg/dL 110.0  HDL Cholesterol     >39.00 mg/dL 38.20 (L)  VLDL     0.0 - 40.0 mg/dL 22.0  LDL (calc)     0 - 99 mg/dL 47  Total CHOL/HDL Ratio      3  NonHDL      69.15  Microalb, Ur     0.0 - 1.9 mg/dL 1.2  Creatinine,U     mg/dL 148.6  MICROALB/CREAT RATIO     0.0 - 30.0 mg/g 0.8  Hemoglobin A1C     4.0 - 5.6 % 6.5 (A)   Labs are at goal with exception of a slightly low HDL.  Proteins are slightly high.  Will suggest to continue to follow-up for this with PCP.  Philemon Kingdom, MD PhD [facility]

## 2020-11-04 ENCOUNTER — Other Ambulatory Visit: Payer: Self-pay | Admitting: Family Medicine

## 2020-11-04 MED FILL — FENOFIBRATE 160 MG TABLET: 160 | 90 days supply | Qty: 90 | Fill #3

## 2020-11-04 MED FILL — LISINOPRIL 20 MG TABLET: 20 | 90 days supply | Qty: 90 | Fill #0

## 2020-11-09 ENCOUNTER — Other Ambulatory Visit: Payer: Self-pay | Admitting: Internal Medicine

## 2020-11-10 ENCOUNTER — Ambulatory Visit (INDEPENDENT_AMBULATORY_CARE_PROVIDER_SITE_OTHER): Payer: Medicare Other

## 2020-11-10 ENCOUNTER — Other Ambulatory Visit: Payer: Self-pay | Admitting: Internal Medicine

## 2020-11-10 ENCOUNTER — Other Ambulatory Visit: Payer: Self-pay

## 2020-11-10 VITALS — BP 132/70 | HR 107 | Temp 98.3°F | Resp 20 | Wt 298.1 lb

## 2020-11-10 DIAGNOSIS — Z0000 Encounter for general adult medical examination without abnormal findings: Secondary | ICD-10-CM

## 2020-11-10 MED FILL — METFORMIN HCL 1000 MG TABS: 1000 | 90 days supply | Qty: 180 | Fill #0

## 2020-11-10 NOTE — Patient Instructions (Addendum)
Jermaine James , Thank you for taking time to come for your Medicare Wellness Visit. I appreciate your ongoing commitment to your health goals. Please review the following plan we discussed and let me know if I can assist you in the future.   Screening recommendations/referrals: Colonoscopy: Done 04/26/18 Recommended yearly ophthalmology/optometry visit for glaucoma screening and checkup Recommended yearly dental visit for hygiene and checkup  Vaccinations: Influenza vaccine: Done 05/27/20 Pneumococcal vaccine: Declined and discussed Tdap vaccine: Due and discussed Shingles vaccine: Shingrix discussed. Please contact your pharmacy for coverage information.    Covid-19: Completed 1/19 & 09/24/19  Advanced directives: .Copies in chart  Conditions/risks identified: Lose weight   Next appointment: Follow up in one year for your annual wellness visit.   Preventive Care 67 Years and Older, Male Preventive care refers to lifestyle choices and visits with your health care provider that can promote health and wellness. What does preventive care include?  A yearly physical exam. This is also called an annual well check.  Dental exams once or twice a year.  Routine eye exams. Ask your health care provider how often you should have your eyes checked.  Personal lifestyle choices, including:  Daily care of your teeth and gums.  Regular physical activity.  Eating a healthy diet.  Avoiding tobacco and drug use.  Limiting alcohol use.  Practicing safe sex.  Taking low doses of aspirin every day.  Taking vitamin and mineral supplements as recommended by your health care provider. What happens during an annual well check? The services and screenings done by your health care provider during your annual well check will depend on your age, overall health, lifestyle risk factors, and family history of disease. Counseling  Your health care provider may ask you questions about your:  Alcohol  use.  Tobacco use.  Drug use.  Emotional well-being.  Home and relationship well-being.  Sexual activity.  Eating habits.  History of falls.  Memory and ability to understand (cognition).  Work and work Statistician. Screening  You may have the following tests or measurements:  Height, weight, and BMI.  Blood pressure.  Lipid and cholesterol levels. These may be checked every 5 years, or more frequently if you are over 49 years old.  Skin check.  Lung cancer screening. You may have this screening every year starting at age 10 if you have a 30-pack-year history of smoking and currently smoke or have quit within the past 15 years.  Fecal occult blood test (FOBT) of the stool. You may have this test every year starting at age 8.  Flexible sigmoidoscopy or colonoscopy. You may have a sigmoidoscopy every 5 years or a colonoscopy every 10 years starting at age 46.  Prostate cancer screening. Recommendations will vary depending on your family history and other risks.  Hepatitis C blood test.  Hepatitis B blood test.  Sexually transmitted disease (STD) testing.  Diabetes screening. This is done by checking your blood sugar (glucose) after you have not eaten for a while (fasting). You may have this done every 1-3 years.  Abdominal aortic aneurysm (AAA) screening. You may need this if you are a current or former smoker.  Osteoporosis. You may be screened starting at age 42 if you are at high risk. Talk with your health care provider about your test results, treatment options, and if necessary, the need for more tests. Vaccines  Your health care provider may recommend certain vaccines, such as:  Influenza vaccine. This is recommended every year.  Tetanus,  diphtheria, and acellular pertussis (Tdap, Td) vaccine. You may need a Td booster every 10 years.  Zoster vaccine. You may need this after age 32.  Pneumococcal 13-valent conjugate (PCV13) vaccine. One dose is  recommended after age 83.  Pneumococcal polysaccharide (PPSV23) vaccine. One dose is recommended after age 62. Talk to your health care provider about which screenings and vaccines you need and how often you need them. This information is not intended to replace advice given to you by your health care provider. Make sure you discuss any questions you have with your health care provider. Document Released: 08/28/2015 Document Revised: 04/20/2016 Document Reviewed: 06/02/2015 Elsevier Interactive Patient Education  2017 [place] Prevention in the Home Falls can cause injuries. They can happen to people of all ages. There are many things you can do to make your home safe and to help prevent falls. What can I do on the outside of my home?  Regularly fix the edges of walkways and driveways and fix any cracks.  Remove anything that might make you trip as you walk through a door, such as a raised step or threshold.  Trim any bushes or trees on the path to your home.  Use bright outdoor lighting.  Clear any walking paths of anything that might make someone trip, such as rocks or tools.  Regularly check to see if handrails are loose or broken. Make sure that both sides of any steps have handrails.  Any raised decks and porches should have guardrails on the edges.  Have any leaves, snow, or ice cleared regularly.  Use sand or salt on walking paths during winter.  Clean up any spills in your garage right away. This includes oil or grease spills. What can I do in the bathroom?  Use night lights.  Install grab bars by the toilet and in the tub and shower. Do not use towel bars as grab bars.  Use non-skid mats or decals in the tub or shower.  If you need to sit down in the shower, use a plastic, non-slip stool.  Keep the floor dry. Clean up any water that spills on the floor as soon as it happens.  Remove soap buildup in the tub or shower regularly.  Attach bath mats  securely with double-sided non-slip rug tape.  Do not have throw rugs and other things on the floor that can make you trip. What can I do in the bedroom?  Use night lights.  Make sure that you have a light by your bed that is easy to reach.  Do not use any sheets or blankets that are too big for your bed. They should not hang down onto the floor.  Have a firm chair that has side arms. You can use this for support while you get dressed.  Do not have throw rugs and other things on the floor that can make you trip. What can I do in the kitchen?  Clean up any spills right away.  Avoid walking on wet floors.  Keep items that you use a lot in easy-to-reach places.  If you need to reach something above you, use a strong step stool that has a grab bar.  Keep electrical cords out of the way.  Do not use floor polish or wax that makes floors slippery. If you must use wax, use non-skid floor wax.  Do not have throw rugs and other things on the floor that can make you trip. What can I do with  my stairs?  Do not leave any items on the stairs.  Make sure that there are handrails on both sides of the stairs and use them. Fix handrails that are broken or loose. Make sure that handrails are as long as the stairways.  Check any carpeting to make sure that it is firmly attached to the stairs. Fix any carpet that is loose or worn.  Avoid having throw rugs at the top or bottom of the stairs. If you do have throw rugs, attach them to the floor with carpet tape.  Make sure that you have a light switch at the top of the stairs and the bottom of the stairs. If you do not have them, ask someone to add them for you. What else can I do to help prevent falls?  Wear shoes that:  Do not have high heels.  Have rubber bottoms.  Are comfortable and fit you well.  Are closed at the toe. Do not wear sandals.  If you use a stepladder:  Make sure that it is fully opened. Do not climb a closed  stepladder.  Make sure that both sides of the stepladder are locked into place.  Ask someone to hold it for you, if possible.  Clearly mark and make sure that you can see:  Any grab bars or handrails.  First and last steps.  Where the edge of each step is.  Use tools that help you move around (mobility aids) if they are needed. These include:  Canes.  Walkers.  Scooters.  Crutches.  Turn on the lights when you go into a dark area. Replace any light bulbs as soon as they burn out.  Set up your furniture so you have a clear path. Avoid moving your furniture around.  If any of your floors are uneven, fix them.  If there are any pets around you, be aware of where they are.  Review your medicines with your doctor. Some medicines can make you feel dizzy. This can increase your chance of falling. Ask your doctor what other things that you can do to help prevent falls. This information is not intended to replace advice given to you by your health care provider. Make sure you discuss any questions you have with your health care provider. Document Released: 05/28/2009 Document Revised: 01/07/2016 Document Reviewed: 09/05/2014 Elsevier Interactive Patient Education  2017 [employer].

## 2020-11-10 NOTE — Progress Notes (Addendum)
Subjective:   Jermaine Archey. is a 65 y.o. male who presents for an Initial Medicare Annual Wellness Visit.  Review of Systems     Cardiac Risk Factors include: advanced age (>62men, >22 women);diabetes mellitus;hypertension;dyslipidemia;male gender;obesity (BMI >30kg/m2)     Objective:    Today's Vitals   11/10/20 1517  BP: 132/70  Pulse: (!) 107  Resp: 20  Temp: 98.3 F (36.8 C)  SpO2: 97%  Weight: 298 lb 1.6 oz (135.2 kg)   Body mass index is 41.58 kg/m.  Advanced Directives 11/10/2020 12/18/2019 11/21/2019 01/30/2019 05/14/2018 05/13/2018 09/14/2016  Does Patient Have a Medical Advance Directive? Yes Yes Yes Yes Yes Yes Yes  Type of Paramedic of [place];Living will - [place];Living will [place];Living will - Living will  Does patient want to make changes to medical advance directive? - - - No - Patient declined No - Patient declined - No - Patient declined  Copy of [place] in Chart? No - copy requested - - No - copy requested No - copy requested - -  Would patient like information on creating a medical advance directive? - No - Patient declined - - - - -  Pre-existing out of facility DNR order (yellow form or pink MOST form) - - - - - - -    Current Medications (verified) Outpatient Encounter Medications as of 11/10/2020  Medication Sig  . fenofibrate 160 MG tablet TAKE 1 TABLET (160 MG TOTAL) BY MOUTH DAILY.  . hydrochlorothiazide (HYDRODIURIL) 25 MG tablet Take 1 tablet (25 mg total) by mouth daily.  Marland Kitchen lisinopril (ZESTRIL) 20 MG tablet TAKE 1 TABLET (20 MG TOTAL) BY MOUTH DAILY.  . metFORMIN (GLUCOPHAGE) 1000 MG tablet TAKE 1 TABLET (1000 MG TOTAL) BY MOUTH 2 TIMES DAILY WITH A MEAL.  . sildenafil (VIAGRA) 100 MG tablet Take 1 tablet (100 mg total) by mouth as needed for erectile dysfunction.  . DUREZOL 0.05 % EMUL Place 1 drop into the right eye 4 (four)  times daily. (Patient not taking: Reported on 11/10/2020)  . fluticasone (FLONASE) 50 MCG/ACT nasal spray Place 1-2 sprays into both nostrils daily for 7 days. (Patient taking differently: Place 1-2 sprays into both nostrils daily as needed.)  . gatifloxacin (ZYMAXID) 0.5 % SOLN Place 1 drop into the right eye 4 (four) times daily. (Patient not taking: Reported on 11/10/2020)  . [DISCONTINUED] FENOFIBRATE PO Take by mouth.   No facility-administered encounter medications on file as of 11/10/2020.    Allergies (verified) Patient has no known allergies.   History: Past Medical History:  Diagnosis Date  . 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 Chronic kidney disease   . 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 Dental crowns present   . Family history of adverse reaction to anesthesia    pt's father has hx. of post-op N/V  . High triglycerides    no current med.  Marland Kitchen History of gout   . History of kidney stones   . Hypertension    states under control with med., has been on med. ~ 73 yr.  . Non-insulin dependent type 2 diabetes mellitus ([place])   . Obesity, Class III, BMI 40-49.9 (morbid obesity) ([place])   . OSA on CPAP   . Osteoarthritis    knees (09/14/2016)  . Sleep apnea   . 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 cancerous mole removed-back  09/2019  . COLONOSCOPY  08/29/2005   per Dr. Sharlett Iles, diverticulosis only, repeat in 10 yrs   . CYSTOSCOPY  2011   "related to trace blood in urine"  . EYE SURGERY Left 1961   traumatic loss of eye  . INGUINAL HERNIA REPAIR Left   . KNEE ARTHROSCOPY Left 1990s?  Marland Kitchen KNEE ARTHROSCOPY WITH MEDIAL MENISECTOMY Right 11/08/2012   Procedure: RIGHT KNEE ARTHROSCOPY WITH PARTIAL MEDIAL AND LATERAL MENISECTOMIES, CHONDROPLASTY;  Surgeon: [name], MD;  Location: [facility];  Service: Orthopedics;  Laterality: Right;  RIGHT KNEE SCOPE WITH PARTIAL  MEDIAL AND LATERAL  MENISCECTOMIES  AND CHONDROPLASTY  . KNEE CLOSED REDUCTION Left 08/18/2016   Procedure: LEFT  MANIPULATION KNEE;  Surgeon: [name], MD;  Location: [facility];  Service: Orthopedics;  Laterality: Left;  . LIPOMA EXCISION Left    upper back  . MOHS SURGERY Left 10/2015   "forehead"  . RETINAL TEAR REPAIR CRYOTHERAPY Right 11/2017  . SPINE SURGERY  02/07/2019  . TOTAL KNEE ARTHROPLASTY Left 05/18/2016   Procedure: LEFT TOTAL KNEE ARTHROPLASTY;  Surgeon: [name], MD;  Location: [facility];  Service: Orthopedics;  Laterality: Left;  . TOTAL KNEE ARTHROPLASTY Right 09/14/2016   Procedure: TOTAL KNEE ARTHROPLASTY MANIPULATION LEFT KNEE;  Surgeon: [name], MD;  Location: [facility];  Service: Orthopedics;  Laterality: Right;   Family History  Problem Relation Age of Onset  . Anesthesia problems Father        post-op N/V  . Colon cancer Neg Hx   . Esophageal cancer Neg Hx   . Rectal cancer Neg Hx   . Stomach cancer Neg Hx    Social History   Socioeconomic History  . Marital status: Married    Spouse name: Not on file  . Number of children: Not on file  . Years of education: Not on file  . Highest education level: Not on file  Occupational History  . Occupation: Customer service manager    Comment: retired  Tobacco Use  . Smoking status: Never Smoker  . Smokeless tobacco: Never Used  Vaping Use  . Vaping Use: Never used  Substance and Sexual Activity  . Alcohol use: Yes    Alcohol/week: 3.0 - 4.0 standard drinks    Types: 1 Glasses of wine, 1 Cans of beer, 1 - 2 Standard drinks or equivalent per week  . Drug use: No  . Sexual activity: Yes  Other Topics Concern  . Not on file  Social History Narrative  . Not on file   Social Determinants of Health   Financial Resource Strain: Low Risk   . Difficulty of Paying Living Expenses: Not hard at all  Food Insecurity: No Food Insecurity  . Worried About Charity fundraiser in the Last Year: Never true   . Ran Out of Food in the Last Year: Never true  Transportation Needs: No Transportation Needs  . Lack of Transportation (Medical): No  . Lack of Transportation (Non-Medical): No  Physical Activity: Insufficiently Active  . Days of Exercise per Week: 3 days  . Minutes of Exercise per Session: 30 min  Stress: No Stress Concern Present  . Feeling of Stress : Not at all  Social Connections: Socially Integrated  . Frequency of Communication with Friends and Family: More than three times a week  . Frequency of Social Gatherings with Friends  and Family: More than three times a week  . Attends Religious Services: More than 4 times per year  . Active Member of Clubs or Organizations: Yes  . Attends Archivist Meetings: 1 to 4 times per year  . Marital Status: Married    Tobacco Counseling Counseling given: Not Answered   Clinical Intake:  Pre-visit preparation completed: Yes  Pain : No/denies pain     BMI - recorded: 41.58 Nutritional Status: BMI > 30  Obese Nutritional Risks: None Diabetes: Yes CBG done?: No Did pt. bring in CBG monitor from home?: No  How often do you need to have someone help you when you read instructions, pamphlets, or other written materials from your doctor or pharmacy?: 1 - Never  Diabetic? Nutrition Risk Assessment:  Has the patient had any N/V/D within the last 2 months?  No  Does the patient have any non-healing wounds?  No  Has the patient had any unintentional weight loss or weight gain?  No   Diabetes:  Is the patient diabetic?  Yes  If diabetic, was a CBG obtained today?  No  Did the patient bring in their glucometer from home?  No  How often do you monitor your CBG's? Daily at various times .   Financial Strains and Diabetes Management:  Are you having any financial strains with the device, your supplies or your medication? No .  Does the patient want to be seen by Chronic Care Management for management of their diabetes?  No   Would the patient like to be referred to a Nutritionist or for Diabetic Management?  No   Diabetic Exams:  Diabetic Eye Exam: Completed 09/19/19 Diabetic Foot Exam: Overdue, Pt has been advised about the importance in completing this exam. Pt is scheduled for diabetic foot exam on next appt .   Interpreter Needed?: No  Information entered by :: [name], LPN   Activities of Daily Living In your present state of health, do you have any difficulty performing the following activities: 11/10/2020  Hearing? N  Vision? N  Difficulty concentrating or making decisions? N  Walking or climbing stairs? N  Dressing or bathing? N  Doing errands, shopping? N  Preparing Food and eating ? N  Using the Toilet? N  In the past six months, have you accidently leaked urine? N  Do you have problems with loss of bowel control? N  Managing your Medications? N  Managing your Finances? N  Housekeeping or managing your Housekeeping? N  Some recent data might be hidden    Patient Care Team: Laurey Morale, MD as PCP - General  Indicate any recent Medical Services you may have received from other than [facility] providers in the past year (date may be approximate).     Assessment:   This is a routine wellness examination for [place].  Hearing/Vision screen  Hearing Screening   125Hz  250Hz  500Hz  1000Hz  2000Hz  3000Hz  4000Hz  6000Hz  8000Hz   Right ear:           Left ear:           Comments: Pt denies any hearing issues   Vision Screening Comments: Pt follows up with Dr Barbie Haggis for annual eye exams   Dietary issues and exercise activities discussed: Current Exercise Habits: Home exercise routine, Type of exercise: walking, Time (Minutes): 30, Frequency (Times/Week): 2, Weekly Exercise (Minutes/Week): 60  Goals    . Patient Stated     Lose weight  Depression Screen PHQ 2/9 Scores 11/10/2020 11/21/2019 10/07/2014  PHQ - 2 Score 0 0 0    Fall Risk Fall Risk  11/10/2020 11/21/2019  Falls in  the past year? 0 0  Number falls in past yr: 0 -  Injury with Fall? 0 -  Risk for fall due to : Impaired vision -  Follow up Falls prevention discussed -    FALL RISK PREVENTION PERTAINING TO THE HOME:  Any stairs in or around the home? Yes  If so, are there any without handrails? No  Home free of loose throw rugs in walkways, pet beds, electrical cords, etc? Yes  Adequate lighting in your home to reduce risk of falls? No   ASSISTIVE DEVICES UTILIZED TO PREVENT FALLS:  Life alert? No  Use of a cane, walker or w/c? No  Grab bars in the bathroom? No  Shower chair or bench in shower? No  Elevated toilet seat or a handicapped toilet? Yes   TIMED UP AND GO:  Was the test performed? Yes .  Length of time to ambulate 10 feet: 10 sec.   Gait steady and fast without use of assistive device  Cognitive Function:     6CIT Screen 11/10/2020  What Year? 0 points  What month? 0 points  Count back from 20 0 points  Months in reverse 0 points  Repeat phrase 0 points    Immunizations Immunization History  Administered Date(s) Administered  . Fluad Quad(high Dose 65+) 05/27/2020  . PFIZER Comirnaty(Gray Top)Covid-19 Tri-Sucrose Vaccine 09/03/2019, 09/24/2019, 08/03/2020  . Td 10/28/2009    TDAP status: Due, Education has been provided regarding the importance of this vaccine. Advised may receive this vaccine at local pharmacy or Health Dept. Aware to provide a copy of the vaccination record if obtained from local pharmacy or Health Dept. Verbalized acceptance and understanding.   Flu Vaccine status: Up to date  Pneumococcal vaccine status: Declined,  Education has been provided regarding the importance of this vaccine but patient still declined. Advised may receive this vaccine at local pharmacy or Health Dept. Aware to provide a copy of the vaccination record if obtained from local pharmacy or Health Dept. Verbalized acceptance and understanding.   Covid-19 vaccine status:  Completed vaccines  Qualifies for Shingles Vaccine? Yes   Zostavax completed No   Shingrix Completed?: No.    Education has been provided regarding the importance of this vaccine. Patient has been advised to call insurance company to determine out of pocket expense if they have not yet received this vaccine. Advised may also receive vaccine at local pharmacy or Health Dept. Verbalized acceptance and understanding.  Screening Tests Health Maintenance  Topic Date Due  . Hepatitis C Screening  Never done  . FOOT EXAM  Never done  . TETANUS/TDAP  10/29/2019  . PNA vac Low Risk Adult (1 of 2 - PCV13) 11/10/2021 (Originally 05/14/2019)  . HEMOGLOBIN A1C  03/29/2021  . OPHTHALMOLOGY EXAM  09/18/2021  . COLONOSCOPY (Pts 45-13yrs Insurance coverage will need to be confirmed)  04/27/2023  . INFLUENZA VACCINE  Completed  . COVID-19 Vaccine  Completed  . HPV VACCINES  Aged Out    Health Maintenance  Health Maintenance Due  Topic Date Due  . Hepatitis C Screening  Never done  . FOOT EXAM  Never done  . TETANUS/TDAP  10/29/2019    Colorectal cancer screening: Type of screening: Colonoscopy. Completed 04/26/18. Repeat every 5 years   Additional Screening:  Hepatitis C Screening: does qualify;  Vision Screening: Recommended annual ophthalmology exams for early detection of glaucoma and other disorders of the eye. Is the patient up to date with their annual eye exam?  Yes  Who is the provider or what is the name of the office in which the patient attends annual eye exams? Dr Clydene Laming at [facility]  If pt is not established with a provider, would they like to be referred to a provider to establish care? No .   Dental Screening: Recommended annual dental exams for proper oral hygiene  Community Resource Referral / Chronic Care Management: CRR required this visit?  No   CCM required this visit?  No      Plan:     I have personally reviewed and noted the following in the patient's  chart:   . Medical and social history . Use of alcohol, tobacco or illicit drugs  . Current medications and supplements . Functional ability and status . Nutritional status . Physical activity . Advanced directives . List of other physicians . Hospitalizations, surgeries, and ER visits in previous 12 months . Vitals . Screenings to include cognitive, depression, and falls . Referrals and appointments  In addition, I have reviewed and discussed with patient certain preventive protocols, quality metrics, and best practice recommendations. A written personalized care plan for preventive services as well as general preventive health recommendations were provided to patient.     Jermaine Brace, LPN   [id number]   Nurse Notes: None

## 2020-11-18 ENCOUNTER — Other Ambulatory Visit (HOSPITAL_COMMUNITY): Payer: Self-pay

## 2020-11-18 MED FILL — Hydrochlorothiazide Tab 25 MG: ORAL | 90 days supply | Qty: 90 | Fill #0 | Status: AC

## 2020-12-23 ENCOUNTER — Ambulatory Visit (INDEPENDENT_AMBULATORY_CARE_PROVIDER_SITE_OTHER): Payer: Medicare Other | Admitting: Family Medicine

## 2020-12-23 ENCOUNTER — Other Ambulatory Visit: Payer: Self-pay

## 2020-12-23 ENCOUNTER — Other Ambulatory Visit (HOSPITAL_COMMUNITY): Payer: Self-pay

## 2020-12-23 ENCOUNTER — Encounter: Payer: Self-pay | Admitting: Family Medicine

## 2020-12-23 VITALS — BP 140/78 | HR 101 | Temp 98.2°F | Wt 295.2 lb

## 2020-12-23 DIAGNOSIS — M545 Low back pain, unspecified: Secondary | ICD-10-CM

## 2020-12-23 MED ORDER — METHYLPREDNISOLONE 4 MG PO TBPK
ORAL_TABLET | Freq: Every day | ORAL | 0 refills | Status: DC
  Filled 2020-12-23: qty 21, 6d supply, fill #0

## 2020-12-23 MED ORDER — CYCLOBENZAPRINE HCL 10 MG PO TABS
10.0000 mg | ORAL_TABLET | Freq: Three times a day (TID) | ORAL | 0 refills | Status: DC | PRN
  Filled 2020-12-23: qty 60, 20d supply, fill #0

## 2020-12-23 NOTE — Progress Notes (Signed)
   Subjective:    Patient ID: Jermaine James., male    DOB: [birth year], 65 y.o.   MRN: [id number]  HPI Here for 9 days of intermittent sharp pains in the right lower back that radiate to the right hip area. No recent trauma. No radiation to the legs. Heat helps.    Review of Systems  Constitutional: Negative.   Respiratory: Negative.   Cardiovascular: Negative.   Musculoskeletal: Positive for back pain.       Objective:   Physical Exam Constitutional:      Appearance: Normal appearance.     Comments: Walks slowly, in pain   Cardiovascular:     Rate and Rhythm: Normal rate and regular rhythm.     Pulses: Normal pulses.     Heart sounds: Normal heart sounds.  Pulmonary:     Effort: Pulmonary effort is normal.     Breath sounds: Normal breath sounds.  Musculoskeletal:     Comments: He is tender over the right lower back with a lot of spasm. ROM is limited   Neurological:     Mental Status: He is alert.           Assessment & Plan:  Lower back spasms. Treat with heat, Flexeril, and a Medrol dose pack.  Alysia Penna, MD

## 2020-12-31 ENCOUNTER — Other Ambulatory Visit (HOSPITAL_COMMUNITY): Payer: Self-pay

## 2021-01-20 ENCOUNTER — Other Ambulatory Visit: Payer: Self-pay | Admitting: Family Medicine

## 2021-01-20 ENCOUNTER — Other Ambulatory Visit (HOSPITAL_COMMUNITY): Payer: Self-pay

## 2021-01-20 MED ORDER — FENOFIBRATE 160 MG PO TABS
160.0000 mg | ORAL_TABLET | Freq: Every day | ORAL | 1 refills | Status: DC
  Filled 2021-01-20: qty 90, 90d supply, fill #0
  Filled 2021-05-04: qty 90, 90d supply, fill #1

## 2021-02-09 ENCOUNTER — Other Ambulatory Visit: Payer: Self-pay | Admitting: Family Medicine

## 2021-02-09 ENCOUNTER — Ambulatory Visit: Payer: Medicare Other | Admitting: Internal Medicine

## 2021-02-09 ENCOUNTER — Other Ambulatory Visit (HOSPITAL_COMMUNITY): Payer: Self-pay

## 2021-02-09 MED FILL — Hydrochlorothiazide Tab 25 MG: ORAL | 90 days supply | Qty: 90 | Fill #1 | Status: AC

## 2021-02-09 MED FILL — Metformin HCl Tab 1000 MG: ORAL | 90 days supply | Qty: 180 | Fill #0 | Status: AC

## 2021-02-09 NOTE — Progress Notes (Deleted)
Patient ID: Jermaine James., male   DOB: Sep 25, 1955, 65 y.o.   MRN: [id number]   This visit occurred during the SARS-CoV-2 public health emergency.  Safety protocols were in place, including screening questions prior to the visit, additional usage of staff PPE, and extensive cleaning of exam room while observing appropriate contact time as indicated for disinfecting solutions.   HPI: Jermaine James. is a 65 y.o.-year-old male, initially referred by his PCP, Dr. Sarajane Jews, returning for follow-up for DM2, dx in 2012, non-insulin-dependent, uncontrolled, without long-term complications. He is the husband of Jermaine James, who is also my pt.  Last visit 4 months ago.  Interim history: No increased urination, blurry vision, nausea, chest pain.  Reviewed HbA1c level: Lab Results  Component Value Date   HGBA1C 6.5 (A) 09/29/2020   HGBA1C 6.7 (A) 05/28/2020   HGBA1C 6.2 (A) 01/22/2020   HGBA1C 7.6 (A) 10/18/2019   HGBA1C 8.3 (H) 07/18/2019   HGBA1C 7.6 (H) 01/30/2019   HGBA1C 6.8 (H) 02/01/2018   HGBA1C 7.0 (H) 09/02/2016   HGBA1C 6.5 04/20/2016   HGBA1C 6.1 05/12/2015   HGBA1C 6.5 01/16/2014   HGBA1C 7.0 (H) 08/20/2012   HGBA1C 6.5 (H) 07/02/2012   HGBA1C 6.6 (H) 07/05/2011   HGBA1C 6.4 06/23/2010   HGBA1C 6.4 10/30/2009   HGBA1C 6.4 07/24/2009   HGBA1C 6.3 (H) 04/20/2007   Pt is on a regimen of: - Metformin 500 mg 2x a day, with meals >> 1000 mg twice a day  He checks sugars 0 to 1x a day: - am: 110-129 >> 130-150 >> 120-126, 134 - 2h after b'fast: 120-138, 176 >> n/c >> 110-142 - before lunch: 115-137, 179, 183 >> n/c >> 110-140 - 2h after lunch: 126-189, 206 >> <150 >> 138-162, 175 - before dinner: 105-140, 169 >> n/c >> 98-133, 158 - 2h after dinner: 157, 167, 239 >> n/c >> 142-177 - bedtime: n/c >> 141-187 >> n/c >> 119-213, 239 - nighttime: n/c Lowest sugar was 130 >> 98 Highest sugar was 150 >> 239 (pizza, Soda)  Glucometer: none >> True Focus  Pt's meals are: -  Breakfast: cheerios + 1-2% milk; pancake + waffle + bacon; - Lunch: sandwich - PB/turkey/nan - Dinner: meat + starch + veggies - Snacks: 1-2x a day - At last visit he was still drinking 1 soda a day-advised him to stop >> still occasionally drinking these.  -No CKD, last BUN/creatinine:  Lab Results  Component Value Date   BUN 16 09/29/2020   BUN 15 07/18/2019   CREATININE 0.96 09/29/2020   CREATININE 0.91 07/18/2019   No MAU: Component     Latest Ref Rng & Units 01/22/2020  Microalb, Ur     0.0 - 1.9 mg/dL 1.6  Creatinine,U     mg/dL 97.4  MICROALB/CREAT RATIO     0.0 - 30.0 mg/g 1.7  On lisinopril 20.  -+ HL; last set of lipids: Lab Results  Component Value Date   CHOL 107 09/29/2020   HDL 38.20 (L) 09/29/2020   LDLCALC 47 09/29/2020   LDLDIRECT 86.0 02/01/2018   TRIG 110.0 09/29/2020   CHOLHDL 3 09/29/2020  On fenofibrate 160. He refused statins.  - last eye exam was in 08/2020: No DR. Artificial eye OS.  + Cataract OD >> will have sx.  - no numbness and tingling in his feet.  Pt has no FH of DM.  He also has a history of OSA-on CPAP, gout, HTN.  ROS: Constitutional: no weight  gain/no weight loss, no fatigue, no subjective hyperthermia, no subjective hypothermia Eyes: no blurry vision, no xerophthalmia ENT: no sore throat, no nodules palpated in neck, no dysphagia, no odynophagia, no hoarseness Cardiovascular: no CP/no SOB/no palpitations/no leg swelling Respiratory: no cough/no SOB/no wheezing Gastrointestinal: no N/no V/no D/no C/no acid reflux Musculoskeletal: no muscle aches/no joint aches Skin: no rashes, no hair loss Neurological: no tremors/no numbness/no tingling/no dizziness  I reviewed pt's medications, allergies, PMH, social hx, family hx, and changes were documented in the history of present illness. Otherwise, unchanged from my initial visit note.  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 of post-op N/V   High triglycerides    no current med.   History of gout    History of kidney stones    Hypertension    states under control with med., has been on med. ~ 1 yr.   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 Sharlett Iles, diverticulosis only, repeat in 10 yrs    CYSTOSCOPY  2011   "related to trace blood in urine"   EYE SURGERY Left 1961   traumatic loss of eye   INGUINAL HERNIA REPAIR Left    KNEE ARTHROSCOPY Left 1990s?   KNEE ARTHROSCOPY WITH MEDIAL MENISECTOMY Right 11/08/2012   Procedure: RIGHT KNEE ARTHROSCOPY WITH PARTIAL MEDIAL AND LATERAL MENISECTOMIES, CHONDROPLASTY;  Surgeon: [name], MD;  Location: [place];  Service: Orthopedics;  Laterality: Right;  RIGHT KNEE SCOPE WITH PARTIAL MEDIAL AND LATERAL  MENISCECTOMIES  AND CHONDROPLASTY   KNEE CLOSED REDUCTION Left 08/18/2016   Procedure: LEFT  MANIPULATION KNEE;  Surgeon: [name], MD;  Location: [facility];  Service: Orthopedics;  Laterality: Left;   LIPOMA EXCISION Left    upper back   MOHS SURGERY Left 10/2015   "forehead"   RETINAL TEAR REPAIR CRYOTHERAPY Right 11/2017   SPINE SURGERY  02/07/2019   TOTAL KNEE ARTHROPLASTY Left 05/18/2016   Procedure: LEFT TOTAL KNEE ARTHROPLASTY;  Surgeon: [name], MD;  Location: [place];  Service: Orthopedics;  Laterality: Left;   TOTAL KNEE ARTHROPLASTY Right 09/14/2016   Procedure: TOTAL KNEE ARTHROPLASTY MANIPULATION LEFT KNEE;  Surgeon:  [name], MD;  Location: [facility];  Service: Orthopedics;  Laterality: Right;   Social History   Socioeconomic History   Marital status: Married    Spouse name: Not on file   Number of children: 3   Years of education: Not on file   Highest education level: Not on file  Occupational History   Occupation: Customer service manager -retired  Tobacco Use   Smoking status: Never Smoker   Smokeless tobacco: Never Used  Substance and Sexual Activity   Alcohol use: Yes    Alcohol/week: 3.0 - 4.0 standard drinks    Types: Beer, wine, mixed   Drug  use: No   Sexual activity: Yes  Other Topics Concern   Not on file  Social History Narrative   Not on file   Social Determinants of Health   Financial Resource Strain:    Difficulty of Paying Living Expenses: Not on file  Food Insecurity:    Worried About [place] in the Last Year: Not on file   Ran Out of Food in the Last Year: Not on file  Transportation Needs:    Lack of Transportation (Medical): Not on file   Lack of Transportation (Non-Medical): Not on file  Physical Activity:    Days of Exercise per Week: Not on file   Minutes of Exercise per Session: Not on file  Stress:    Feeling of Stress : Not on file  Social Connections:    Frequency of Communication with Friends and Family: Not on file   Frequency of Social Gatherings with Friends and Family: Not on file   Attends Religious Services: Not on file   Active Member of Clubs or Organizations: Not on file   Attends Archivist Meetings: Not on file   Marital Status: Not on file  Intimate Partner Violence:    Fear of Current or Ex-Partner: Not on file   Emotionally Abused: Not on file   Physically Abused: Not on file   Sexually Abused: Not on file   Current Outpatient Medications on File Prior to Visit  Medication Sig Dispense Refill   cyclobenzaprine (FLEXERIL) 10 MG tablet Take 1 tablet (10 mg total) by mouth 3 (three) times daily as needed for muscle spasms. 60  tablet 0   DUREZOL 0.05 % EMUL Place 1 drop into the right eye 4 (four) times daily. (Patient not taking: No sig reported)     fenofibrate 160 MG tablet Take 1 tablet (160 mg total) by mouth daily. 90 tablet 1   fluticasone (FLONASE) 50 MCG/ACT nasal spray Place 1-2 sprays into both nostrils daily for 7 days. (Patient taking differently: Place 1-2 sprays into both nostrils daily as needed.) 1 g 0   gatifloxacin (ZYMAXID) 0.5 % SOLN Place 1 drop into the right eye 4 (four) times daily. (Patient not taking: No sig reported)     hydrochlorothiazide (HYDRODIURIL) 25 MG tablet TAKE 1 TABLET (25 MG TOTAL) BY MOUTH DAILY. 90 tablet 3   lisinopril (ZESTRIL) 20 MG tablet TAKE 1 TABLET (20 MG TOTAL) BY MOUTH DAILY. 90 tablet 0   metFORMIN (GLUCOPHAGE) 1000 MG tablet TAKE 1 TABLET (1000 MG TOTAL) BY MOUTH 2 TIMES DAILY WITH A MEAL. 180 tablet 3   methylPREDNISolone (MEDROL DOSEPAK) 4 MG TBPK tablet Take as directed. 21 tablet 0   sildenafil (VIAGRA) 100 MG tablet Take 1 tablet (100 mg total) by mouth as needed for erectile dysfunction. 10 tablet 11   [DISCONTINUED] FENOFIBRATE PO Take by mouth.     No current facility-administered medications on file prior to visit.   No Known Allergies Family History  Problem Relation Age of Onset   Anesthesia problems Father        post-op N/V   Colon cancer Neg Hx    Esophageal cancer Neg Hx    Rectal cancer Neg Hx    Stomach cancer Neg Hx    PE: There were no vitals taken for this visit. Wt Readings from Last 3 Encounters:  12/23/20 295 lb 3.2 oz (133.9 kg)  11/10/20 298 lb 1.6 oz (135.2 kg)  09/29/20 299 lb 9.6 oz (135.9 kg)  Constitutional: overweight, in NAD Eyes: PERRLA, EOMI, no exophthalmos ENT: moist mucous membranes, no thyromegaly, no cervical lymphadenopathy Cardiovascular: RRR, No MRG Respiratory: CTA B Gastrointestinal: abdomen soft, NT, ND, BS+ Musculoskeletal: no deformities, strength intact in all 4 Skin: moist, warm, no  rashes Neurological: no tremor with outstretched hands, DTR normal in all 4  ASSESSMENT: 1. DM2, non-insulin-dependent, uncontrolled, without long-term complications, but with hyperglycemia  2. HL  3.  Obesity class III  PLAN:  1. Patient with longstanding, uncontrolled, type 2 diabetes, on oral antidiabetic regimen with metformin only, with improved diabetes control in the last year.  In the past, he was having high blood sugars after cereals in the morning and we discussed about healthier options for breakfast.  At last visit, sugars were at or slightly above goal, with only few exceptions, where he was having dietary discretions including pizza, snacks, sodas.  He was working on eliminating these.  I did suggest a glipizide tablet before a larger meal but he did not want to add another medication to his regimen.  HbA1c at that time was still at goal, 6.5%, actually lower than before.  -I suggested to: Patient Instructions  Please continue: - Metformin 1000 mg 2x a day with meals  Stop sweet drinks!  Please return in 4 months with your sugar log.   - we checked his HbA1c: 7%  - advised to check sugars at different times of the day - 1x a day, rotating check times - advised for yearly eye exams >> he is UTD - return to clinic in 4 months  2. HL -Reviewed latest lipid panel from 09/2020: Fractions at goal with exception of a slightly low HDL: Lab Results  Component Value Date   CHOL 107 09/29/2020   HDL 38.20 (L) 09/29/2020   LDLCALC 47 09/29/2020   LDLDIRECT 86.0 02/01/2018   TRIG 110.0 09/29/2020   CHOLHDL 3 09/29/2020  -He is not on a statin but continues fenofibrate 160 mg daily without side effects -At last visit, we discussed about adding a statin to his regimen after explaining the benefits beyond lowering cholesterol levels.  He did not want to start a statin at that time.  He felt that he was taking too many medicines.  3.  Obesity class III -Continues metformin,  which has an appetite suppressant effect -At last visit, he reduced sodas but still drinking them.  I strongly advised him to stop completely. -He gained 4 pounds before last visit  Philemon Kingdom, MD PhD [facility] Endocrinology

## 2021-02-10 ENCOUNTER — Other Ambulatory Visit (HOSPITAL_COMMUNITY): Payer: Self-pay

## 2021-02-10 MED ORDER — LISINOPRIL 20 MG PO TABS
20.0000 mg | ORAL_TABLET | Freq: Every day | ORAL | 0 refills | Status: DC
  Filled 2021-02-10: qty 90, 90d supply, fill #0

## 2021-02-16 ENCOUNTER — Encounter: Payer: Self-pay | Admitting: Internal Medicine

## 2021-02-16 ENCOUNTER — Other Ambulatory Visit: Payer: Self-pay

## 2021-02-16 ENCOUNTER — Ambulatory Visit (INDEPENDENT_AMBULATORY_CARE_PROVIDER_SITE_OTHER): Payer: Medicare Other | Admitting: Internal Medicine

## 2021-02-16 VITALS — BP 142/72 | HR 102 | Ht 71.0 in | Wt 291.0 lb

## 2021-02-16 DIAGNOSIS — E785 Hyperlipidemia, unspecified: Secondary | ICD-10-CM

## 2021-02-16 DIAGNOSIS — E1165 Type 2 diabetes mellitus with hyperglycemia: Secondary | ICD-10-CM | POA: Diagnosis not present

## 2021-02-16 DIAGNOSIS — E669 Obesity, unspecified: Secondary | ICD-10-CM

## 2021-02-16 LAB — POCT GLYCOSYLATED HEMOGLOBIN (HGB A1C): Hemoglobin A1C: 6.1 % — AB (ref 4.0–5.6)

## 2021-02-16 NOTE — Patient Instructions (Addendum)
Please continue: - Metformin 1000 mg 2x a day with meals  Stop sweet drinks!  Please return in 4-6 months with your sugar log.  

## 2021-02-16 NOTE — Progress Notes (Signed)
Patient ID: Jermaine James., male   DOB: 1956-04-30, 65 y.o.   MRN: [id number]   This visit occurred during the SARS-CoV-2 public health emergency.  Safety protocols were in place, including screening questions prior to the visit, additional usage of staff PPE, and extensive cleaning of exam room while observing appropriate contact time as indicated for disinfecting solutions.   HPI: Jermaine James. is a 65 y.o.-year-old male, initially referred by his PCP, Dr. Sarajane Jews, returning for follow-up for DM2, dx in 2012, non-insulin-dependent, uncontrolled, without long-term complications. He is the husband of Jermaine James, who is also my pt.  Last visit 4 months ago.  Interim history: No increased urination, blurry vision, nausea, chest pain. He did notice darker urine after mowing his lawn few days ago.  He continues to have this.  Reviewed HbA1c level: Lab Results  Component Value Date   HGBA1C 6.5 (A) 09/29/2020   HGBA1C 6.7 (A) 05/28/2020   HGBA1C 6.2 (A) 01/22/2020   HGBA1C 7.6 (A) 10/18/2019   HGBA1C 8.3 (H) 07/18/2019   HGBA1C 7.6 (H) 01/30/2019   HGBA1C 6.8 (H) 02/01/2018   HGBA1C 7.0 (H) 09/02/2016   HGBA1C 6.5 04/20/2016   HGBA1C 6.1 05/12/2015   HGBA1C 6.5 01/16/2014   HGBA1C 7.0 (H) 08/20/2012   HGBA1C 6.5 (H) 07/02/2012   HGBA1C 6.6 (H) 07/05/2011   HGBA1C 6.4 06/23/2010   HGBA1C 6.4 10/30/2009   HGBA1C 6.4 07/24/2009   HGBA1C 6.3 (H) 04/20/2007   Pt is on a regimen of: - Metformin 500 mg 2x a day, with meals >> 1000 mg twice a day  He checks sugars 0 to 1x a day: - am: 110-129 >> 130-150 >> 120-126, 134 >> 110-128, 135 - 2h after b'fast: 120-138, 176 >> n/c >> 110-142 >> 118-135 - before lunch: 115-137, 179, 183 >> n/c >> 110-140 >> 106-123 - 2h after lunch: 126-189, 206 >> <150 >> 138-162, 175 >> 118-139, 166 - before dinner: 105-140, 169 >> n/c >> 98-133, 158 >> 110-142 - 2h after dinner: 157, 167, 239 >> n/c >> 142-177 >> 143-179, 197 - bedtime: n/c >>  141-187 >> n/c >> 119-213, 239 >> 139-168, 217 - nighttime: n/c Lowest sugar was 130 >> 98 >> 96. Highest sugar was 150 >> 239 (pizza, Soda) >> 208  Glucometer: none >> True Focus  Pt's meals are: - Breakfast: cheerios + 1-2% milk; pancake + waffle + bacon; - Lunch: sandwich - PB/turkey/nan - Dinner: meat + starch + veggies - Snacks: 1-2x a day - At last visit he was still drinking 1 soda a day-advised him to stop >> still occasionally drinking these.  -No CKD, last BUN/creatinine:  Lab Results  Component Value Date   BUN 16 09/29/2020   BUN 15 07/18/2019   CREATININE 0.96 09/29/2020   CREATININE 0.91 07/18/2019   No MAU: Component     Latest Ref Rng & Units 01/22/2020  Microalb, Ur     0.0 - 1.9 mg/dL 1.6  Creatinine,U     mg/dL 97.4  MICROALB/CREAT RATIO     0.0 - 30.0 mg/g 1.7  On lisinopril 20.  -+ HL; last set of lipids: Lab Results  Component Value Date   CHOL 107 09/29/2020   HDL 38.20 (L) 09/29/2020   LDLCALC 47 09/29/2020   LDLDIRECT 86.0 02/01/2018   TRIG 110.0 09/29/2020   CHOLHDL 3 09/29/2020  On fenofibrate 160. He refused statins.  - last eye exam was in 08/2020: No DR. Artificial eye OS.  +  Cataract OD >> will have sx.  - no numbness and tingling in his feet.  Pt has no FH of DM.  He also has a history of OSA-on CPAP, gout, HTN.  ROS: Constitutional: no weight gain/no weight loss, no fatigue, no subjective hyperthermia, no subjective hypothermia Eyes: no blurry vision, no xerophthalmia ENT: no sore throat, no nodules palpated in neck, no dysphagia, no odynophagia, no hoarseness Cardiovascular: no CP/no SOB/no palpitations/no leg swelling Respiratory: no cough/no SOB/no wheezing Gastrointestinal: no N/no V/no D/no C/no acid reflux Musculoskeletal: no muscle aches/no joint aches Skin: no rashes, no hair loss Neurological: no tremors/no numbness/no tingling/no dizziness  I reviewed pt's medications, allergies, PMH, social hx, family hx, and  changes were documented in the history of present illness. Otherwise, unchanged from my initial visit note.  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 of post-op N/V   High triglycerides    no current med.   History of gout    History of kidney stones    Hypertension    states under control with med., has been on med. ~ 65 yr.   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 Sharlett Iles, diverticulosis only, repeat in 10 yrs    CYSTOSCOPY  2011   "related to trace blood in urine"   EYE SURGERY Left 1961   traumatic loss of eye   INGUINAL HERNIA REPAIR Left    KNEE ARTHROSCOPY Left 1990s?   KNEE ARTHROSCOPY WITH MEDIAL MENISECTOMY Right 11/08/2012   Procedure: RIGHT KNEE ARTHROSCOPY WITH PARTIAL MEDIAL AND LATERAL MENISECTOMIES, CHONDROPLASTY;  Surgeon: [name], MD;  Location: [place];  Service: Orthopedics;  Laterality: Right;  RIGHT KNEE SCOPE WITH PARTIAL MEDIAL AND LATERAL  MENISCECTOMIES  AND CHONDROPLASTY   KNEE CLOSED REDUCTION Left 08/18/2016   Procedure: LEFT  MANIPULATION KNEE;  Surgeon: [name], MD;  Location: [place];  Service: Orthopedics;  Laterality: Left;   LIPOMA EXCISION Left    upper back   MOHS SURGERY Left 10/2015   "forehead"   RETINAL TEAR REPAIR CRYOTHERAPY Right 11/2017   SPINE SURGERY  02/07/2019   TOTAL KNEE ARTHROPLASTY Left 05/18/2016   Procedure: LEFT TOTAL KNEE ARTHROPLASTY;   Surgeon: [name], MD;  Location: [place];  Service: Orthopedics;  Laterality: Left;   TOTAL KNEE ARTHROPLASTY Right 09/14/2016   Procedure: TOTAL KNEE ARTHROPLASTY MANIPULATION LEFT KNEE;  Surgeon: [name], MD;  Location: [place];  Service: Orthopedics;  Laterality: Right;   Social History   Socioeconomic History   Marital status: Married    Spouse name: Not on file   Number of children: 3   Years of education: Not on file   Highest education level: Not on file  Occupational History   Occupation: Customer service manager -retired  Tobacco Use   Smoking  status: Never Smoker   Smokeless tobacco: Never Used  Substance and Sexual Activity   Alcohol use: Yes    Alcohol/week: 3.0 - 4.0 standard drinks    Types: Beer, wine, mixed   Drug use: No   Sexual activity: Yes  Other Topics Concern   Not on file  Social History Narrative   Not on file   Social Determinants of Health   Financial Resource Strain:    Difficulty of Paying Living Expenses: Not on file  Food Insecurity:    Worried About [place] in the Last Year: Not on file   [employer] of Food in the Last Year: Not on file  Transportation Needs:    Lack of Transportation (Medical): Not on file   Lack of Transportation (Non-Medical): Not on file  Physical Activity:    Days of Exercise per Week: Not on file   Minutes of Exercise per Session: Not on file  Stress:    Feeling of Stress : Not on file  Social Connections:    Frequency of Communication with Friends and Family: Not on file   Frequency of Social Gatherings with Friends and Family: Not on file   Attends Religious Services: Not on file   Active Member of Clubs or Organizations: Not on file   Attends Archivist Meetings: Not on file   Marital Status: Not on file  Intimate Partner Violence:    Fear of Current or Ex-Partner: Not on file   Emotionally Abused: Not on file   Physically Abused: Not on file   Sexually Abused: Not on file   Current  Outpatient Medications on File Prior to Visit  Medication Sig Dispense Refill   cyclobenzaprine (FLEXERIL) 10 MG tablet Take 1 tablet (10 mg total) by mouth 3 (three) times daily as needed for muscle spasms. 60 tablet 0   DUREZOL 0.05 % EMUL Place 1 drop into the right eye 4 (four) times daily. (Patient not taking: No sig reported)     fenofibrate 160 MG tablet Take 1 tablet (160 mg total) by mouth daily. 90 tablet 1   fluticasone (FLONASE) 50 MCG/ACT nasal spray Place 1-2 sprays into both nostrils daily for 7 days. (Patient taking differently: Place 1-2 sprays into both nostrils daily as needed.) 1 g 0   gatifloxacin (ZYMAXID) 0.5 % SOLN Place 1 drop into the right eye 4 (four) times daily. (Patient not taking: No sig reported)     hydrochlorothiazide (HYDRODIURIL) 25 MG tablet TAKE 1 TABLET (25 MG TOTAL) BY MOUTH DAILY. 90 tablet 3   lisinopril (ZESTRIL) 20 MG tablet Take 1 tablet (20 mg total) by mouth daily. 90 tablet 0   metFORMIN (GLUCOPHAGE) 1000 MG tablet TAKE 1 TABLET (1000 MG TOTAL) BY MOUTH 2 TIMES DAILY WITH A MEAL. 180 tablet 3   methylPREDNISolone (MEDROL DOSEPAK) 4 MG TBPK tablet Take as directed. 21 tablet 0   sildenafil (VIAGRA) 100 MG tablet Take 1 tablet (100 mg total) by mouth as needed for erectile dysfunction. 10 tablet 11   [DISCONTINUED] FENOFIBRATE PO Take by mouth.     No current facility-administered medications on file prior to visit.   No Known Allergies Family History  Problem Relation Age of Onset   Anesthesia problems Father        post-op N/V   Colon cancer Neg Hx    Esophageal cancer Neg Hx    Rectal cancer Neg Hx    Stomach cancer Neg Hx    PE:  BP (!) 142/72 (BP Location: Left Arm, Patient Position: Sitting, Cuff Size: Large)   Pulse (!) 102   Ht 5\' 11"  (1.803 m)   Wt 291 lb (132 kg)   SpO2 97%   BMI 40.59 kg/m  Wt Readings from Last 3 Encounters:  02/16/21 291 lb (132 kg)  12/23/20 295 lb 3.2 oz (133.9 kg)  11/10/20 298 lb 1.6 oz (135.2 kg)    Constitutional: overweight, in NAD Eyes: PERRLA, EOMI, no exophthalmos ENT: moist mucous membranes, no thyromegaly, no cervical lymphadenopathy Cardiovascular: tachycardia, RR, No MRG Respiratory: CTA B Gastrointestinal: abdomen soft, NT, ND, BS+ Musculoskeletal: no deformities, strength intact in all 4 Skin: moist, warm, no rashes Neurological: no tremor with outstretched hands, DTR normal in all 4  ASSESSMENT: 1. DM2, non-insulin-dependent, uncontrolled, without long-term complications, but with hyperglycemia  2. HL  3.  Obesity class III  PLAN:  1. Patient with longstanding, uncontrolled, type 2 diabetes, on oral antidiabetic regimen with metformin only, with improved diabetes control in the last year.  In the past, he was having high blood sugars after cereals in the morning and we discussed about healthier options for breakfast.  At last visit, sugars were at or slightly above goal, with only 3 exceptions, when he was having dietary indiscretions including pizza, snacks, sodas.  He was working on eliminating these.  I did suggest that glipizide tablet before a larger meal but he did not want to add another medication to his regimen.  HbA1c at that time was still at goal, 6.5%, actually lower than before. -At today's visit, sugars are mostly at goal, slightly improved in the first half of the day but with occasional hyperglycemic spikes later in the day, especially at bedtime, after dietary indiscretions.  These are rare, however.  We did discuss about moving his bedtime metformin to dinnertime to avoid higher blood sugars after dinner.  Otherwise, I do not feel he needs a change in regimen. -For his dark urine, we discussed about trying to stay well hydrated.  In the meantime, I again advised him to stop sweet drinks. -I suggested to: Patient Instructions  Please continue: - Metformin 1000 mg 2x a day with meals  Stop sweet drinks!  Please return in 4 months with your sugar log.    - we checked his HbA1c: 6.1% (better) - advised to check sugars at different times of the day - 1x a day, rotating check times - advised for yearly eye exams >> he is UTD - return to clinic in 4 months  2. HL -Reviewed latest lipid panel from 09/2020: Fractions at goal with the exception of a slightly low HDL Lab Results  Component Value Date   CHOL 107 09/29/2020   HDL 38.20 (L) 09/29/2020   LDLCALC 47 09/29/2020   LDLDIRECT 86.0 02/01/2018   TRIG 110.0 09/29/2020   CHOLHDL 3 09/29/2020  -He is not on a statin but continues fenofibrate 160 mg daily without side effects -At last visit, we discussed about adding a statin to his regimen after explaining the benefits beyond lowering cholesterol levels.  He did not want to start a statin at that time.  He felt that he was taking too many medicines.  3.  Obesity class III -Continues metformin which has a mild appetite suppressant effect. -At last visit, he reduced sodas but he was still drinking them.  I strongly advised him to stop completely. -He gained 4 pounds before last visit. Lost 8 lbs since last OV.  Salena Saner  Cruzita Lederer, MD PhD [facility]

## 2021-05-04 ENCOUNTER — Other Ambulatory Visit (HOSPITAL_COMMUNITY): Payer: Self-pay

## 2021-05-10 ENCOUNTER — Other Ambulatory Visit (HOSPITAL_COMMUNITY): Payer: Self-pay

## 2021-05-10 MED FILL — Metformin HCl Tab 1000 MG: ORAL | 90 days supply | Qty: 180 | Fill #1 | Status: AC

## 2021-05-13 ENCOUNTER — Other Ambulatory Visit: Payer: Self-pay | Admitting: Family Medicine

## 2021-05-13 ENCOUNTER — Other Ambulatory Visit (HOSPITAL_COMMUNITY): Payer: Self-pay

## 2021-05-13 MED ORDER — LISINOPRIL 20 MG PO TABS
20.0000 mg | ORAL_TABLET | Freq: Every day | ORAL | 0 refills | Status: DC
  Filled 2021-05-13: qty 90, 90d supply, fill #0

## 2021-05-13 MED ORDER — HYDROCHLOROTHIAZIDE 25 MG PO TABS
25.0000 mg | ORAL_TABLET | Freq: Every day | ORAL | 3 refills | Status: DC
  Filled 2021-05-13: qty 90, 90d supply, fill #0
  Filled 2021-08-02: qty 90, 90d supply, fill #1
  Filled 2021-11-08: qty 90, 90d supply, fill #2

## 2021-06-21 ENCOUNTER — Other Ambulatory Visit: Payer: Self-pay | Admitting: Family Medicine

## 2021-07-27 ENCOUNTER — Other Ambulatory Visit: Payer: Self-pay

## 2021-07-27 ENCOUNTER — Ambulatory Visit (INDEPENDENT_AMBULATORY_CARE_PROVIDER_SITE_OTHER): Payer: Medicare Other | Admitting: Internal Medicine

## 2021-07-27 ENCOUNTER — Encounter: Payer: Self-pay | Admitting: Internal Medicine

## 2021-07-27 VITALS — BP 140/70 | HR 86 | Ht 71.0 in | Wt 298.2 lb

## 2021-07-27 DIAGNOSIS — E785 Hyperlipidemia, unspecified: Secondary | ICD-10-CM | POA: Diagnosis not present

## 2021-07-27 DIAGNOSIS — E1165 Type 2 diabetes mellitus with hyperglycemia: Secondary | ICD-10-CM

## 2021-07-27 DIAGNOSIS — Z23 Encounter for immunization: Secondary | ICD-10-CM

## 2021-07-27 LAB — POCT GLYCOSYLATED HEMOGLOBIN (HGB A1C): Hemoglobin A1C: 6.4 % — AB (ref 4.0–5.6)

## 2021-07-27 NOTE — Progress Notes (Signed)
Patient ID: [name], male   DOB: 1956/07/25, 65 y.o.   MRN: [id number]   This visit occurred during the SARS-CoV-2 public health emergency.  Safety protocols were in place, including screening questions prior to the visit, additional usage of staff PPE, and extensive cleaning of exam room while observing appropriate contact time as indicated for disinfecting solutions.   HPI: Naji Mehringer. is a 65 y.o.-year-old male, initially referred by his PCP, Dr. Sarajane Jews, returning for follow-up for DM2, dx in 2012, non-insulin-dependent, now controlled, without long-term complications. He is the husband of [name], who is also my pt.  Last visit 5 months ago.  Interim history: No increased urination, blurry vision, nausea, chest pain. Since last visit, he increased his soda intake a little.  Reviewed HbA1c level: Lab Results  Component Value Date   HGBA1C 6.1 (A) 02/16/2021   HGBA1C 6.5 (A) 09/29/2020   HGBA1C 6.7 (A) 05/28/2020   HGBA1C 6.2 (A) 01/22/2020   HGBA1C 7.6 (A) 10/18/2019   HGBA1C 8.3 (H) 07/18/2019   HGBA1C 7.6 (H) 01/30/2019   HGBA1C 6.8 (H) 02/01/2018   HGBA1C 7.0 (H) 09/02/2016   HGBA1C 6.5 04/20/2016   HGBA1C 6.1 05/12/2015   HGBA1C 6.5 01/16/2014   HGBA1C 7.0 (H) 08/20/2012   HGBA1C 6.5 (H) 07/02/2012   HGBA1C 6.6 (H) 07/05/2011   HGBA1C 6.4 06/23/2010   HGBA1C 6.4 10/30/2009   HGBA1C 6.4 07/24/2009   HGBA1C 6.3 (H) 04/20/2007   Pt is on a regimen of: - Metformin 500 mg 2x a day, with meals >> 1000 mg twice a day  He checks sugars 0 to 1x a day: - am: 110-129 >> 130-150 >> 120-126, 134 >> 110-128, 135 >> 116-125, 137 - 2h after b'fast: 120-138, 176 >> n/c >> 110-142 >> 118-135 >> 115-138, 177 - before lunch: 115-137, 179, 183 >> n/c >> 110-140 >> 106-123 >> 118-140 - 2h after lunch: <150 >> 138-162, 175 >> 118-139, 166 >> 128-140 - before dinner: 105-140, 169 >> n/c >> 98-133, 158 >> 110-142 >> 116-132, 165 - 2h after dinner: 157, 167, 239 >> n/c  >> 142-177 >> 143-179, 197 >> 132-169, 168 - bedtime: n/c >> 141-187 >> n/c >> 119-213, 239 >> 139-168, 217 >> n/c - nighttime: n/c Lowest sugar was 98 >> 96 >> 115 Highest sugar was 239 (pizza, Soda) >> 208 >> 177  Glucometer: none >> True Focus  Pt's meals are: - Breakfast: cheerios + 1-2% milk; pancake + waffle + bacon; - Lunch: sandwich - PB/turkey/nan - Dinner: meat + starch + veggies - Snacks: 1-2x a day -  -No CKD, last BUN/creatinine:  Lab Results  Component Value Date   BUN 16 09/29/2020   BUN 15 07/18/2019   CREATININE 0.96 09/29/2020   CREATININE 0.91 07/18/2019   No MAU: Component     Latest Ref Rng & Units 01/22/2020  Microalb, Ur     0.0 - 1.9 mg/dL 1.6  Creatinine,U     mg/dL 97.4  MICROALB/CREAT RATIO     0.0 - 30.0 mg/g 1.7  On lisinopril 20.  -+ HL; last set of lipids: Lab Results  Component Value Date   CHOL 107 09/29/2020   HDL 38.20 (L) 09/29/2020   LDLCALC 47 09/29/2020   LDLDIRECT 86.0 02/01/2018   TRIG 110.0 09/29/2020   CHOLHDL 3 09/29/2020  On fenofibrate 160. He refused statins.  - last eye exam was in 08/2020: No DR. Artificial eye OS.  + Cataract OD >>  will have sx.  - no numbness and tingling in his feet.  Pt has no FH of DM.  He also has a history of OSA-on CPAP, gout, HTN.  ROS: + see HPI  I reviewed pt's medications, allergies, PMH, social hx, family hx, and changes were documented in the history of present illness. Otherwise, unchanged from my initial visit note.  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 of post-op N/V   High triglycerides    no current med.   History of gout    History of kidney stones    Hypertension    states under control with med., has been on med. ~ 47 yr.   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 Sharlett Iles, diverticulosis only, repeat in 10 yrs    CYSTOSCOPY  2011   "related to trace blood in urine"   EYE SURGERY Left 1961   traumatic loss of eye   INGUINAL HERNIA REPAIR Left    KNEE ARTHROSCOPY Left 1990s?   KNEE ARTHROSCOPY WITH MEDIAL MENISECTOMY Right 11/08/2012   Procedure: RIGHT KNEE ARTHROSCOPY WITH PARTIAL MEDIAL AND LATERAL MENISECTOMIES, CHONDROPLASTY;  Surgeon: [name], MD;  Location: [place];  Service: Orthopedics;  Laterality: Right;  RIGHT KNEE SCOPE WITH PARTIAL MEDIAL AND LATERAL  MENISCECTOMIES  AND CHONDROPLASTY   KNEE CLOSED REDUCTION Left 08/18/2016   Procedure: LEFT  MANIPULATION KNEE;  Surgeon: [name], MD;  Location: [place];  Service: Orthopedics;  Laterality: Left;   LIPOMA EXCISION Left    upper back   MOHS SURGERY Left 10/2015   "forehead"   RETINAL TEAR REPAIR CRYOTHERAPY Right 11/2017   SPINE SURGERY  02/07/2019   TOTAL KNEE ARTHROPLASTY Left 05/18/2016   Procedure: LEFT TOTAL KNEE ARTHROPLASTY;  Surgeon: [name], MD;  Location: [place];  Service: Orthopedics;  Laterality: Left;   TOTAL KNEE ARTHROPLASTY Right 09/14/2016   Procedure: TOTAL KNEE ARTHROPLASTY MANIPULATION LEFT KNEE;  Surgeon: [name], MD;  Location: [place];  Service: Orthopedics;  Laterality: Right;   Social History   Socioeconomic History   Marital status: Married    Spouse name: Not on file   Number of children: 3   Years of education: Not on file   Highest education level: Not on file  Occupational History   Occupation: Customer service manager -retired  Tobacco Use   Smoking status: Never Smoker    Smokeless tobacco: Never Used  Substance and Sexual Activity   Alcohol use: Yes    Alcohol/week: 3.0 - 4.0 standard drinks    Types: Beer, wine, mixed   Drug use: No   Sexual activity: Yes  Other Topics Concern   Not on file  Social History Narrative   Not on file   Social Determinants of Health   Financial Resource  Strain:    Difficulty of Paying Living Expenses: Not on file  Food Insecurity:    Worried About [place] in the Last Year: Not on file   Ran Out of Food in the Last Year: Not on file  Transportation Needs:    Lack of Transportation (Medical): Not on file   Lack of Transportation (Non-Medical): Not on file  Physical Activity:    Days of Exercise per Week: Not on file   Minutes of Exercise per Session: Not on file  Stress:    Feeling of Stress : Not on file  Social Connections:    Frequency of Communication with Friends and Family: Not on file   Frequency of Social Gatherings with Friends and Family: Not on file   Attends Religious Services: Not on file   Active Member of Clubs or Organizations: Not on file   Attends Archivist Meetings: Not on file   Marital Status: Not on file  Intimate Partner Violence:    Fear of Current or Ex-Partner: Not on file   Emotionally Abused: Not on file   Physically Abused: Not on file   Sexually Abused: Not on file   Current Outpatient Medications on File Prior to Visit  Medication Sig Dispense Refill   fenofibrate 160 MG tablet Take 1 tablet (160 mg total) by mouth daily. 90 tablet 1   hydrochlorothiazide (HYDRODIURIL) 25 MG tablet Take 1 tablet (25 mg total) by mouth daily. 90 tablet 3   lisinopril (ZESTRIL) 20 MG tablet Take 1 tablet (20 mg total) by mouth daily. 90 tablet 0   metFORMIN (GLUCOPHAGE) 1000 MG tablet TAKE 1 TABLET (1000 MG TOTAL) BY MOUTH 2 TIMES DAILY WITH A MEAL. 180 tablet 3   sildenafil (VIAGRA) 100 MG tablet TAKE 1 TABLET BY MOUTH AS NEEDED FOR ERECTILE DYSFUNCTION 10 tablet 1    [DISCONTINUED] FENOFIBRATE PO Take by mouth.     No current facility-administered medications on file prior to visit.   No Known Allergies Family History  Problem Relation Age of Onset   Anesthesia problems Father        post-op N/V   Colon cancer Neg Hx    Esophageal cancer Neg Hx    Rectal cancer Neg Hx    Stomach cancer Neg Hx    PE: BP 140/70 (BP Location: Right Arm, Patient Position: Sitting, Cuff Size: Normal)    Pulse 86    Ht 5\' 11"  (1.803 m)    Wt 298 lb 3.2 oz (135.3 kg)    SpO2 95%    BMI 41.59 kg/m  Wt Readings from Last 3 Encounters:  07/27/21 298 lb 3.2 oz (135.3 kg)  02/16/21 291 lb (132 kg)  12/23/20 295 lb 3.2 oz (133.9 kg)   Constitutional: overweight, in NAD Eyes: PERRLA, EOMI, no exophthalmos ENT: moist mucous membranes, no thyromegaly, no cervical lymphadenopathy Cardiovascular: tachycardia, RR, No MRG Respiratory: CTA B Musculoskeletal: no deformities, strength intact in all 4 Skin: moist, warm, no rashes Neurological: no tremor with outstretched hands, DTR normal in all 4  ASSESSMENT: 1. DM2, non-insulin-dependent, now controlled, without long-term complications, but with hyperglycemia  2. HL  3.  Obesity class III  PLAN:  1. Patient with longstanding, uncontrolled, type 2 diabetes, on oral antidiabetic regimen with metformin only, with improved diabetes control in the last 1.5 years.  In the past, he was having high blood sugars after cereals in the morning and we discussed about healthier options for breakfast.  I also  advised him to reduce pizza and snacks as much as possible, but to eliminate sweet drinks completely.  At last visit, sugars were mostly at goal, slightly improved in the first half of the day but with occasional hyperglycemic spikes later in the day, especially at bedtime, after dietary indiscretions.  However, these instances were more rare.  We discussed about moving his bedtime metformin to dinnertime to avoid higher blood sugars  after dinner but otherwise we did not change his regimen.  HbA1c was 6.1%, decreased. -At today's visit, sugars appear to be still at or very close to goal, with few values above goal.  The values in the last 2 months appear to be quite similar to the ones obtained at last visit, despite a slightly higher HbA1c level (see below).  For now, I would not suggest a change in regimen, but we discussed that he absolutely needs to stop sodas. -I suggested to: Patient Instructions  Please continue: - Metformin 1000 mg 2x a day with meals  Stop sweet drinks!  Please return in 4 months with your sugar log.   - we checked his HbA1c: 6.3% (slightly higher) - advised to check sugars at different times of the day - 1x a day, rotating check times - advised for yearly eye exams >> he is UTD - return to clinic in 4 months  2. HL -Reviewed latest lipid panel from 09/2020: Fractions at goal with the exception of a slightly low HDL: Lab Results  Component Value Date   CHOL 107 09/29/2020   HDL 38.20 (L) 09/29/2020   LDLCALC 47 09/29/2020   LDLDIRECT 86.0 02/01/2018   TRIG 110.0 09/29/2020   CHOLHDL 3 09/29/2020  -He is not on a statin (refused despite a discussion about benefits beyond lowering cholesterol) but he continues fenofibrate 160 mg daily.  3.  Obesity class III -Continues on metformin which has a mild appetite suppressant effect -He continues to reduce sweet drinks but I did advise him to stop these completely -He lost 8 pounds before last visit, but gained 7 pounds since then  + Flu shot today.  Philemon Kingdom, MD PhD [facility] Endocrinology

## 2021-07-27 NOTE — Addendum Note (Signed)
Addended by: Lauralyn Primes on: 07/27/2021 02:08 PM   Modules accepted: Orders

## 2021-07-27 NOTE — Patient Instructions (Signed)
Please continue: - Metformin 1000 mg 2x a day with meals  Stop sweet drinks!  Please return in 4 months with your sugar log.

## 2021-08-02 ENCOUNTER — Other Ambulatory Visit (HOSPITAL_COMMUNITY): Payer: Self-pay

## 2021-08-02 ENCOUNTER — Other Ambulatory Visit: Payer: Self-pay | Admitting: Family Medicine

## 2021-08-02 MED ORDER — LISINOPRIL 20 MG PO TABS
20.0000 mg | ORAL_TABLET | Freq: Every day | ORAL | 0 refills | Status: DC
  Filled 2021-08-02: qty 90, 90d supply, fill #0

## 2021-08-02 MED ORDER — FENOFIBRATE 160 MG PO TABS
160.0000 mg | ORAL_TABLET | Freq: Every day | ORAL | 1 refills | Status: DC
  Filled 2021-08-02: qty 90, 90d supply, fill #0
  Filled 2021-10-27: qty 90, 90d supply, fill #1

## 2021-08-10 ENCOUNTER — Other Ambulatory Visit (HOSPITAL_COMMUNITY): Payer: Self-pay

## 2021-08-10 MED FILL — Metformin HCl Tab 1000 MG: ORAL | 90 days supply | Qty: 180 | Fill #2 | Status: AC

## 2021-08-25 ENCOUNTER — Other Ambulatory Visit (HOSPITAL_COMMUNITY): Payer: Self-pay

## 2021-08-25 MED ORDER — AMOXICILLIN 500 MG PO CAPS
500.0000 mg | ORAL_CAPSULE | ORAL | 1 refills | Status: DC
  Filled 2021-08-25: qty 30, 9d supply, fill #0

## 2021-09-15 ENCOUNTER — Other Ambulatory Visit: Payer: Self-pay

## 2021-09-15 ENCOUNTER — Ambulatory Visit (INDEPENDENT_AMBULATORY_CARE_PROVIDER_SITE_OTHER): Payer: Medicare Other

## 2021-09-15 ENCOUNTER — Ambulatory Visit (INDEPENDENT_AMBULATORY_CARE_PROVIDER_SITE_OTHER): Payer: Medicare Other | Admitting: Podiatry

## 2021-09-15 ENCOUNTER — Other Ambulatory Visit: Payer: Self-pay | Admitting: Podiatry

## 2021-09-15 ENCOUNTER — Encounter: Payer: Self-pay | Admitting: Podiatry

## 2021-09-15 DIAGNOSIS — M722 Plantar fascial fibromatosis: Secondary | ICD-10-CM

## 2021-09-15 DIAGNOSIS — M7661 Achilles tendinitis, right leg: Secondary | ICD-10-CM

## 2021-09-15 NOTE — Progress Notes (Signed)
°  Subjective:  Patient ID: Jermaine Mercy., male    DOB: [birth year],   MRN: [id number]  Chief Complaint  Patient presents with   Foot Pain    Posterior heel right - aching x couple years, swells, worsened-pain more intense recently, tried Ibuprofen and ice   Diabetes    Last a1c was 6.7   New Patient (Initial Visit)    66 y.o. male presents for concern of right heel pain that has been present for several years and has recently worsened. Relates it is the worst when taking first steps in the morning. He has been icing and using ibuprofen . He is diabetic and his last A1c was 6.7. Denies any other pedal complaints. Denies n/v/f/c.   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 of post-op N/V   High triglycerides    no current med.   History of gout    History of kidney stones    Hypertension    states under control with med., has been on med. ~ 75 yr.   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 CFT <3 seconds. Normal hair growth on digits. No edema.  Skin. No lacerations or abrasions bilateral feet.  Musculoskeletal: MMT 5/5 bilateral lower extremities in DF, PF, Inversion and Eversion. Deceased ROM in DF of ankle joint. Minimally tender to the insertion of the achilles tendon. No pain with PF/DF today. No pain to plantar medial calcaneal tubercle. No pain along PT tendon.  Neurological: Sensation intact to light touch.   Assessment:   1. Achilles tendinitis of right lower extremity      Plan:  Patient  was evaluated and treated and all questions answered. -Xrays reviewed. Posterior heel spurring noted and cavus type foot on the right  -Discussed Achilles insertional tendonitis and treatment options with patient.  -Discussed stretching exercises. -Heel lifts provided and discussed proper shoewear.  -Discussed if no improvement will consider MRI/PT/EPAT/PRP injections.  -Patient to return to office as needed or sooner if condition worsens.    Lorenda Peck, DPM

## 2021-09-15 NOTE — Patient Instructions (Signed)

## 2021-10-12 ENCOUNTER — Other Ambulatory Visit (HOSPITAL_COMMUNITY): Payer: Self-pay

## 2021-10-12 MED ORDER — PREDNISOLONE ACETATE 1 % OP SUSP
1.0000 [drp] | Freq: Four times a day (QID) | OPHTHALMIC | 0 refills | Status: DC
  Filled 2021-10-12: qty 5, 25d supply, fill #0

## 2021-10-25 ENCOUNTER — Other Ambulatory Visit: Payer: Self-pay | Admitting: Family Medicine

## 2021-10-26 ENCOUNTER — Other Ambulatory Visit: Payer: Self-pay | Admitting: Internal Medicine

## 2021-10-26 ENCOUNTER — Other Ambulatory Visit (HOSPITAL_COMMUNITY): Payer: Self-pay

## 2021-10-26 MED ORDER — METFORMIN HCL 1000 MG PO TABS
1000.0000 mg | ORAL_TABLET | Freq: Two times a day (BID) | ORAL | 0 refills | Status: DC
  Filled 2021-10-26: qty 60, 30d supply, fill #0

## 2021-10-26 MED ORDER — LISINOPRIL 20 MG PO TABS
20.0000 mg | ORAL_TABLET | Freq: Every day | ORAL | 0 refills | Status: DC
  Filled 2021-10-26: qty 30, 30d supply, fill #0

## 2021-10-27 ENCOUNTER — Other Ambulatory Visit: Payer: Self-pay

## 2021-10-27 ENCOUNTER — Other Ambulatory Visit (HOSPITAL_COMMUNITY): Payer: Self-pay

## 2021-10-27 ENCOUNTER — Ambulatory Visit (INDEPENDENT_AMBULATORY_CARE_PROVIDER_SITE_OTHER): Payer: Medicare Other | Admitting: Podiatry

## 2021-10-27 ENCOUNTER — Encounter: Payer: Self-pay | Admitting: Podiatry

## 2021-10-27 DIAGNOSIS — M7661 Achilles tendinitis, right leg: Secondary | ICD-10-CM

## 2021-10-27 DIAGNOSIS — M778 Other enthesopathies, not elsewhere classified: Secondary | ICD-10-CM | POA: Diagnosis not present

## 2021-10-27 NOTE — Progress Notes (Signed)
?  Subjective:  ?Patient ID: Jermaine Mercy., male    DOB: Mar 14, 1956,   MRN: [id number] ? ?Chief Complaint  ?Patient presents with  ? Tendonitis  ?  6 week follow up achilles tendonitis right  ? ? ?66 y.o. male presents for follow-up or right achilles tendonitis. Relates he is doing about 65% better and has bee working on stretching although admits he could be doing more stretching. Does relates a new problem with his right foot. He was doing leg presses and started having pain and burning in the top and bottom of his left foot. Marland Kitchen He is diabetic and his last A1c was 6.7. Denies any other pedal complaints. Denies n/v/f/c.  ? ?Past Medical History:  ?Diagnosis Date  ? Blind left eye   ? 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 Chronic kidney disease   ? 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 Dental crowns present   ? Family history of adverse reaction to anesthesia   ? pt's father has hx. of post-op N/V  ? High triglycerides   ? no current med.  ? History of gout   ? History of kidney stones   ? Hypertension   ? states under control with med., has been on med. ~ 39 yr.  ? Non-insulin dependent type 2 diabetes mellitus ([place])   ? Obesity, Class III, BMI 40-49.9 (morbid obesity) ([place])   ? OSA on CPAP   ? Osteoarthritis   ? knees (09/14/2016)  ? Sleep apnea   ? 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 ? ?Objective:  ?Physical Exam: ?Vascular: DP/PT pulses 2/4 bilateral. CFT <3 seconds. Normal hair growth on digits. No edema.  ?Skin. No lacerations or abrasions bilateral feet.  ?Musculoskeletal: MMT 5/5 bilateral lower extremities in DF, PF, Inversion and Eversion. Deceased ROM in DF of ankle joint. Minimally tender to the insertion of the achilles tendon. No pain with PF/DF today. No pain to plantar medial calcaneal tubercle. No pain along PT tendon. On right no tenderness to palpation but some pain with Pf and eversion of the foot.  ?Neurological: Sensation intact to light touch.  ? ?Assessment:  ? ?1.  Achilles tendinitis of right lower extremity   ?2. Extensor tendonitis of foot   ? ? ? ?Plan:  ?Patient was evaluated and treated and all questions answered. ?-Xrays reviewed. Posterior heel spurring noted and cavus type foot on the right  ?-Discussed Achilles insertional tendonitis and treatment options with patient.  ?-Continue heel lifts and stretching exercises.  ?-Discussed extensor tendonitis vs neuritis on the right. Recommend ice and elevate for time being and stretching exercises and this should improve.  ?-Patient to return to office as needed or sooner if condition worsens. ? ? ? ?Lorenda Peck, DPM  ? ? ?

## 2021-11-08 ENCOUNTER — Other Ambulatory Visit (HOSPITAL_COMMUNITY): Payer: Self-pay

## 2021-11-16 ENCOUNTER — Ambulatory Visit: Payer: Medicare Other

## 2021-11-26 ENCOUNTER — Ambulatory Visit (INDEPENDENT_AMBULATORY_CARE_PROVIDER_SITE_OTHER): Payer: Medicare Other | Admitting: Internal Medicine

## 2021-11-26 ENCOUNTER — Encounter: Payer: Self-pay | Admitting: Internal Medicine

## 2021-11-26 ENCOUNTER — Other Ambulatory Visit (HOSPITAL_COMMUNITY): Payer: Self-pay

## 2021-11-26 VITALS — BP 128/72 | HR 88 | Ht 71.0 in | Wt 294.2 lb

## 2021-11-26 DIAGNOSIS — E785 Hyperlipidemia, unspecified: Secondary | ICD-10-CM

## 2021-11-26 DIAGNOSIS — E1165 Type 2 diabetes mellitus with hyperglycemia: Secondary | ICD-10-CM

## 2021-11-26 MED ORDER — METFORMIN HCL 1000 MG PO TABS
1000.0000 mg | ORAL_TABLET | Freq: Two times a day (BID) | ORAL | 3 refills | Status: DC
  Filled 2021-11-26: qty 180, 90d supply, fill #0
  Filled 2022-03-09: qty 180, 90d supply, fill #1
  Filled 2022-06-10: qty 180, 90d supply, fill #2

## 2021-11-26 NOTE — Patient Instructions (Signed)
Please continue: ?- Metformin 1000 mg 2x a day with meals ? ?Stop sweet drinks! ? ?Please return in 4 months with your sugar log.  ?

## 2021-11-26 NOTE — Progress Notes (Signed)
Patient ID: Jermaine James., male   DOB: 1955-12-28, 66 y.o.   MRN: [id number]  ? ?This visit occurred during the SARS-CoV-2 public health emergency.  Safety protocols were in place, including screening questions prior to the visit, additional usage of staff PPE, and extensive cleaning of exam room while observing appropriate contact time as indicated for disinfecting solutions.  ? ?HPI: ?[name]. is a 66 y.o.-year-old male, initially referred by his PCP, Dr. Sarajane Jews, returning for follow-up for DM2, dx in 2012, non-insulin-dependent, now controlled, without long-term complications. He is the husband of Jermaine James, who is also my pt.  Last visit 4 months ago. ? ?Interim history: ?No increased urination, blurry vision, nausea, chest pain. ?He has sneezing, congestion, left eyelid swelling 2/2 allergies. ?He just returned from a vacation in [place]. ? ?Reviewed HbA1c level: ?Lab Results  ?Component Value Date  ? HGBA1C 6.4 (A) 07/27/2021  ? HGBA1C 6.1 (A) 02/16/2021  ? HGBA1C 6.5 (A) 09/29/2020  ? HGBA1C 6.7 (A) 05/28/2020  ? HGBA1C 6.2 (A) 01/22/2020  ? HGBA1C 7.6 (A) 10/18/2019  ? HGBA1C 8.3 (H) 07/18/2019  ? HGBA1C 7.6 (H) 01/30/2019  ? HGBA1C 6.8 (H) 02/01/2018  ? HGBA1C 7.0 (H) 09/02/2016  ? HGBA1C 6.5 04/20/2016  ? HGBA1C 6.1 05/12/2015  ? HGBA1C 6.5 01/16/2014  ? HGBA1C 7.0 (H) 08/20/2012  ? HGBA1C 6.5 (H) 07/02/2012  ? HGBA1C 6.6 (H) 07/05/2011  ? HGBA1C 6.4 06/23/2010  ? HGBA1C 6.4 10/30/2009  ? HGBA1C 6.4 07/24/2009  ? HGBA1C 6.3 (H) 04/20/2007  ? ?Pt is on a regimen of: ?- Metformin 500 mg 2x a day, with meals >> 1000 mg twice a day ? ?He checks sugars 0 to 1x a day: ?- am:  120-126, 134 >> 110-128, 135 >> 116-125, 137 >> 101-128 ?- 2h after b'fast:  110-142 >> 118-135 >> 115-138, 177 >> 121-147 ?- before lunch:  110-140 >> 106-123 >> 118-140 >> 125-147 ?- 2h after lunch:  118-139, 166 >> 128-140 >> 126-165 ?- before dinner:  98-133, 158 >> 110-142 >> 116-132, 165 >> 137, 144 ?- 2h after  dinner: 142-177 >> 143-179, 197 >> 132-169, 168 >> 144-197 ?- bedtime:  119-213, 239 >> 139-168, 217 >> n/c ?- nighttime: n/c ?Lowest sugar was 98 >> 96 >> 115 >> 101 ?Highest sugar was 239 (pizza, Soda) >> 208 >> 177 >> 197 ? ?Glucometer: none >> True Focus ? ?Pt's meals are: ?- Breakfast: cheerios + 1-2% milk; pancake + waffle + bacon; - Lunch: sandwich - PB/turkey/nan ?- Dinner: meat + starch + veggies ?- Snacks: 1-2x a day -  ?-No CKD, last BUN/creatinine:  ?Lab Results  ?Component Value Date  ? BUN 16 09/29/2020  ? BUN 15 07/18/2019  ? CREATININE 0.96 09/29/2020  ? CREATININE 0.91 07/18/2019  ? ?No MAU: ?Component ?    Latest Ref Rng & Units 01/22/2020  ?Microalb, Ur ?    0.0 - 1.9 mg/dL 1.6  ?Creatinine,U ?    mg/dL 97.4  ?MICROALB/CREAT RATIO ?    0.0 - 30.0 mg/g 1.7  ?On lisinopril 20. ? ?-+ HL; last set of lipids: ?Lab Results  ?Component Value Date  ? CHOL 107 09/29/2020  ? HDL 38.20 (L) 09/29/2020  ? [place] 47 09/29/2020  ? LDLDIRECT 86.0 02/01/2018  ? TRIG 110.0 09/29/2020  ? CHOLHDL 3 09/29/2020  ?On fenofibrate 160. He refused statins. ? ?- last eye exam was in 2023: No DR. Artificial eye OS.  + Cataract OD >> had  sx. ? ?- no numbness and tingling in his feet. ? ?Pt has no FH of DM. ? ?He also has a history of OSA-on CPAP, gout, HTN. ? ?ROS: ?+ see HPI ? ?I reviewed pt's medications, allergies, PMH, social hx, family hx, and changes were documented in the history of present illness. Otherwise, unchanged from my initial visit note. ? ?Past Medical History:  ?Diagnosis Date  ? Blind left eye   ? 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 Chronic kidney disease   ? 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 Dental crowns present   ? Family history of adverse reaction to anesthesia   ? pt's father has hx. of post-op N/V  ? High triglycerides   ? no current med.  ? History of gout   ? History of kidney stones   ? Hypertension   ? states under control with med., has been on med. ~ 47 yr.  ? Non-insulin dependent type 2  diabetes mellitus ([place])   ? Obesity, Class III, BMI 40-49.9 (morbid obesity) ([place])   ? OSA on CPAP   ? Osteoarthritis   ? knees (09/14/2016)  ? Sleep apnea   ? 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 ?Past Surgical History:  ?Procedure Laterality Date  ? cancerous mole removed-back  09/2019  ? COLONOSCOPY  08/29/2005  ? per Dr. Sharlett Iles, diverticulosis only, repeat in 10 yrs   ? CYSTOSCOPY  2011  ? "related to trace blood in urine"  ? EYE SURGERY Left 1961  ? traumatic loss of eye  ? INGUINAL HERNIA REPAIR Left   ? KNEE ARTHROSCOPY Left 1990s?  ? KNEE ARTHROSCOPY WITH MEDIAL MENISECTOMY Right 11/08/2012  ? Procedure: RIGHT KNEE ARTHROSCOPY WITH PARTIAL MEDIAL AND LATERAL MENISECTOMIES, CHONDROPLASTY;  Surgeon: [name], MD;  Location: [facility];  Service: Orthopedics;  Laterality: Right;  RIGHT KNEE SCOPE WITH PARTIAL MEDIAL AND LATERAL  MENISCECTOMIES  AND CHONDROPLASTY  ? KNEE CLOSED REDUCTION Left 08/18/2016  ? Procedure: LEFT  MANIPULATION KNEE;  Surgeon: [name], MD;  Location: [facility];  Service: Orthopedics;  Laterality: Left;  ? LIPOMA EXCISION Left   ? upper back  ? MOHS SURGERY Left 10/2015  ? "forehead"  ? RETINAL TEAR REPAIR CRYOTHERAPY Right 11/2017  ? SPINE SURGERY  02/07/2019  ? TOTAL KNEE ARTHROPLASTY Left 05/18/2016  ? Procedure: LEFT TOTAL KNEE ARTHROPLASTY;  Surgeon: [name], MD;  Location: [facility];  Service: Orthopedics;  Laterality: Left;  ? TOTAL KNEE ARTHROPLASTY Right 09/14/2016  ? Procedure: TOTAL KNEE ARTHROPLASTY MANIPULATION LEFT KNEE;  Surgeon: [name], MD;  Location: [facility];  Service: Orthopedics;  Laterality: Right;  ? ?Social History  ? ?Socioeconomic History  ? Marital status: Married  ?  Spouse name: Not on file  ? Number of children: 3  ? Years of education: Not on file  ? Highest education level: Not on file  ?Occupational History  ? Occupation: Customer service manager -retired  ?Tobacco Use  ? Smoking status: Never Smoker   ? Smokeless tobacco: Never Used  ?Substance and Sexual Activity  ? Alcohol use: Yes  ?  Alcohol/week: 3.0 - 4.0 standard drinks  ?  Types: Beer, wine, mixed  ? Drug use: No  ? Sexual activity: Yes  ?Other Topics Concern  ? Not on file  ?Social History Narrative  ? Not on file  ? ?Social Determinants of Health  ? ?Financial Resource Strain:   ?  Difficulty of Paying Living Expenses: Not on file  ?Food Insecurity:   ? Worried About Charity fundraiser in the Last Year: Not on file  ? Ran Out of Food in the Last Year: Not on file  ?Transportation Needs:   ? Film/video editor (Medical): Not on file  ? Lack of Transportation (Non-Medical): Not on file  ?Physical Activity:   ? Days of Exercise per Week: Not on file  ? Minutes of Exercise per Session: Not on file  ?Stress:   ? Feeling of Stress : Not on file  ?Social Connections:   ? Frequency of Communication with Friends and Family: Not on file  ? Frequency of Social Gatherings with Friends and Family: Not on file  ? Attends Religious Services: Not on file  ? Active Member of Clubs or Organizations: Not on file  ? Attends Archivist Meetings: Not on file  ? Marital Status: Not on file  ?Intimate Partner Violence:   ? Fear of Current or Ex-Partner: Not on file  ? Emotionally Abused: Not on file  ? Physically Abused: Not on file  ? Sexually Abused: Not on file  ? ?Current Outpatient Medications on File Prior to Visit  ?Medication Sig Dispense Refill  ? amoxicillin (AMOXIL) 500 MG capsule Take 2 capsules by mouth now then 1 capsule by mouth three times a day until gone 30 capsule 1  ? fenofibrate 160 MG tablet Take 1 tablet (160 mg total) by mouth daily. 90 tablet 1  ? hydrochlorothiazide (HYDRODIURIL) 25 MG tablet Take 1 tablet (25 mg total) by mouth daily. 90 tablet 3  ? lisinopril (ZESTRIL) 20 MG tablet Take 1 tablet (20 mg total) by mouth daily. *appointment required for future refills* 30 tablet 0  ? metFORMIN (GLUCOPHAGE) 1000 MG tablet Take 1 tablet  (1,000 mg total) by mouth 2 (two) times daily with a meal. 60 tablet 0  ? prednisoLONE acetate (PRED FORTE) 1 % ophthalmic suspension Instill 1 drop into affteced eye 4 (four) times daily for 5 days, then stop

## 2021-12-07 ENCOUNTER — Ambulatory Visit: Payer: Medicare Other

## 2021-12-07 ENCOUNTER — Ambulatory Visit (INDEPENDENT_AMBULATORY_CARE_PROVIDER_SITE_OTHER): Payer: Medicare Other | Admitting: Family Medicine

## 2021-12-07 ENCOUNTER — Ambulatory Visit (INDEPENDENT_AMBULATORY_CARE_PROVIDER_SITE_OTHER): Payer: Medicare Other

## 2021-12-07 ENCOUNTER — Other Ambulatory Visit (HOSPITAL_COMMUNITY): Payer: Self-pay

## 2021-12-07 ENCOUNTER — Encounter: Payer: Self-pay | Admitting: Family Medicine

## 2021-12-07 VITALS — BP 126/78 | HR 74 | Temp 98.8°F | Wt 296.0 lb

## 2021-12-07 VITALS — BP 118/70 | HR 79 | Temp 98.3°F | Ht 71.0 in | Wt 297.5 lb

## 2021-12-07 DIAGNOSIS — R972 Elevated prostate specific antigen [PSA]: Secondary | ICD-10-CM

## 2021-12-07 DIAGNOSIS — Z0000 Encounter for general adult medical examination without abnormal findings: Secondary | ICD-10-CM

## 2021-12-07 DIAGNOSIS — E785 Hyperlipidemia, unspecified: Secondary | ICD-10-CM | POA: Diagnosis not present

## 2021-12-07 DIAGNOSIS — I10 Essential (primary) hypertension: Secondary | ICD-10-CM | POA: Diagnosis not present

## 2021-12-07 DIAGNOSIS — N529 Male erectile dysfunction, unspecified: Secondary | ICD-10-CM

## 2021-12-07 DIAGNOSIS — E119 Type 2 diabetes mellitus without complications: Secondary | ICD-10-CM

## 2021-12-07 LAB — BASIC METABOLIC PANEL
BUN: 15 mg/dL (ref 6–23)
CO2: 25 mEq/L (ref 19–32)
Calcium: 9.9 mg/dL (ref 8.4–10.5)
Chloride: 101 mEq/L (ref 96–112)
Creatinine, Ser: 0.85 mg/dL (ref 0.40–1.50)
GFR: 89.88 mL/min (ref >60.00)
Glucose, Bld: 106 mg/dL — ABNORMAL HIGH (ref 70–99)
Potassium: 4 mEq/L (ref 3.5–5.1)
Sodium: 135 mEq/L (ref 135–145)

## 2021-12-07 LAB — CBC WITH DIFFERENTIAL/PLATELET
Basophils Absolute: 0.1 10*3/uL (ref 0.0–0.1)
Basophils Relative: 1.2 % (ref 0.0–3.0)
Eosinophils Absolute: 0.4 10*3/uL (ref 0.0–0.7)
Eosinophils Relative: 6.9 % — ABNORMAL HIGH (ref 0.0–5.0)
HCT: 39.6 % (ref 39.0–52.0)
Hemoglobin: 13.2 g/dL (ref 13.0–17.0)
Lymphocytes Relative: 25.4 % (ref 12.0–46.0)
Lymphs Abs: 1.6 10*3/uL (ref 0.7–4.0)
MCHC: 33.4 g/dL (ref 30.0–36.0)
MCV: 90.8 fl (ref 78.0–100.0)
Monocytes Absolute: 0.4 10*3/uL (ref 0.1–1.0)
Monocytes Relative: 7 % (ref 3.0–12.0)
Neutro Abs: 3.8 10*3/uL (ref 1.4–7.7)
Neutrophils Relative %: 59.5 % (ref 43.0–77.0)
Platelets: 317 10*3/uL (ref 150.0–400.0)
RBC: 4.36 Mil/uL (ref 4.22–5.81)
RDW: 13.7 % (ref 11.5–15.5)
WBC: 6.4 10*3/uL (ref 4.0–10.5)

## 2021-12-07 LAB — PSA: PSA: 0.11 ng/mL (ref 0.10–4.00)

## 2021-12-07 LAB — LIPID PANEL
Cholesterol: 135 mg/dL (ref 0–200)
HDL: 48 mg/dL (ref >39.00)
LDL Cholesterol: 60 mg/dL (ref 0–99)
NonHDL: 87.43
Total CHOL/HDL Ratio: 3
Triglycerides: 139 mg/dL (ref 0.0–149.0)
VLDL: 27.8 mg/dL (ref 0.0–40.0)

## 2021-12-07 LAB — HEPATIC FUNCTION PANEL
ALT: 24 U/L (ref 0–53)
AST: 23 U/L (ref 0–37)
Albumin: 4.7 g/dL (ref 3.5–5.2)
Alkaline Phosphatase: 44 U/L (ref 39–117)
Bilirubin, Direct: 0.1 mg/dL (ref 0.0–0.3)
Total Bilirubin: 0.5 mg/dL (ref 0.2–1.2)
Total Protein: 8.2 g/dL (ref 6.0–8.3)

## 2021-12-07 LAB — TSH: TSH: 1.64 u[IU]/mL (ref 0.35–5.50)

## 2021-12-07 LAB — HEMOGLOBIN A1C: Hgb A1c MFr Bld: 6.7 % — ABNORMAL HIGH (ref 4.6–6.5)

## 2021-12-07 LAB — TESTOSTERONE: Testosterone: 81.55 ng/dL — ABNORMAL LOW (ref 300.00–890.00)

## 2021-12-07 MED ORDER — TADALAFIL 20 MG PO TABS
10.0000 mg | ORAL_TABLET | Freq: Every day | ORAL | 11 refills | Status: DC | PRN
Start: 2021-12-07 — End: 2022-06-03

## 2021-12-07 MED ORDER — FENOFIBRATE 160 MG PO TABS
160.0000 mg | ORAL_TABLET | Freq: Every day | ORAL | 3 refills | Status: DC
  Filled 2021-12-07 – 2022-01-21 (×2): qty 90, 90d supply, fill #0
  Filled 2022-04-25: qty 6, 6d supply, fill #1
  Filled 2022-04-25: qty 84, 84d supply, fill #1
  Filled 2022-07-18: qty 90, 90d supply, fill #2

## 2021-12-07 MED ORDER — LISINOPRIL-HYDROCHLOROTHIAZIDE 20-12.5 MG PO TABS
1.0000 | ORAL_TABLET | Freq: Every day | ORAL | 3 refills | Status: DC
  Filled 2021-12-07: qty 90, 90d supply, fill #0

## 2021-12-07 NOTE — Progress Notes (Signed)
? ?Subjective:  ? ? Patient ID: Jermaine Mercy., male    DOB: 20-Jul-1956, 66 y.o.   MRN: [id number] ? ?HPI ?Here to follow up on issues. He feels well in general. He has tried Viagra for the ED, but it did not help very much he asks to try something else. His libido is intact.he has joined the [employer] for nutrition and exercise classes. He has lost a few pounds since last year. He sees Dr. Cruzita Lederer for diabetes care, and his A1c in December was 6.4.  ? ? ?Review of Systems  ?Constitutional: Negative.   ?HENT: Negative.    ?Eyes: Negative.   ?Respiratory: Negative.    ?Cardiovascular: Negative.   ?Gastrointestinal: Negative.   ?Genitourinary: Negative.   ?Musculoskeletal: Negative.   ?Skin: Negative.   ?Neurological: Negative.   ?Psychiatric/Behavioral: Negative.    ? ?   ?Objective:  ? Physical Exam ?Constitutional:   ?   General: He is not in acute distress. ?   Appearance: He is well-developed. He is obese. He is not diaphoretic.  ?HENT:  ?   Head: Normocephalic and atraumatic.  ?   Right Ear: External ear normal.  ?   Left Ear: External ear normal.  ?   Nose: Nose normal.  ?   Mouth/Throat:  ?   Pharynx: No oropharyngeal exudate.  ?Eyes:  ?   General: No scleral icterus.    ?   Right eye: No discharge.     ?   Left eye: No discharge.  ?   Conjunctiva/sclera: Conjunctivae normal.  ?   Pupils: Pupils are equal, round, and reactive to light.  ?Neck:  ?   Thyroid: No thyromegaly.  ?   Vascular: No JVD.  ?   Trachea: No tracheal deviation.  ?Cardiovascular:  ?   Rate and Rhythm: Normal rate and regular rhythm.  ?   Heart sounds: Normal heart sounds. No murmur heard. ?  No friction rub. No gallop.  ?Pulmonary:  ?   Effort: Pulmonary effort is normal. No respiratory distress.  ?   Breath sounds: Normal breath sounds. No wheezing or rales.  ?Chest:  ?   Chest wall: No tenderness.  ?Abdominal:  ?   General: Bowel sounds are normal. There is no distension.  ?   Palpations: Abdomen is soft. There is no mass.  ?    Tenderness: There is no abdominal tenderness. There is no guarding or rebound.  ?Genitourinary: ?   Penis: Normal. No tenderness.   ?   Testes: Normal.  ?   Prostate: Normal.  ?   Rectum: Normal. Guaiac result negative.  ?Musculoskeletal:     ?   General: No tenderness. Normal range of motion.  ?   Cervical back: Neck supple.  ?Lymphadenopathy:  ?   Cervical: No cervical adenopathy.  ?Skin: ?   General: Skin is warm and dry.  ?   Coloration: Skin is not pale.  ?   Findings: No erythema or rash.  ?Neurological:  ?   Mental Status: He is alert and oriented to person, place, and time.  ?   Cranial Nerves: No cranial nerve deficit.  ?   Motor: No abnormal muscle tone.  ?   Coordination: Coordination normal.  ?   Deep Tendon Reflexes: Reflexes are normal and symmetric. Reflexes normal.  ?Psychiatric:     ?   Behavior: Behavior normal.     ?   Thought Content: Thought content normal.     ?  Judgment: Judgment normal.  ? ? ? ? ? ?   ?Assessment & Plan:  ?He is working on losing weight. He will follow up with Dr. Cruzita Lederer for the diabetes, but we will get an A1c today. We will get fasting labs to check lipids, etc. For the ED, he will try Cialis as needed. We will screen with a testosterone level today. His HTN is well controlled. We spent a total of (33   ) minutes reviewing records and discussing these issues.  ?Alysia Penna, MD ? ? ?

## 2021-12-07 NOTE — Patient Instructions (Signed)
Jermaine James , ?Thank you for taking time to come for your Medicare Wellness Visit. I appreciate your ongoing commitment to your health goals. Please review the following plan we discussed and let me know if I can assist you in the future.  ? ?Screening recommendations/referrals: ?Colonoscopy: completed 04/26/2018, due 04/26/2028 ?Recommended yearly ophthalmology/optometry visit for glaucoma screening and checkup ?Recommended yearly dental visit for hygiene and checkup ? ?Vaccinations: ?Influenza vaccine: due 03/15/2022 ?Pneumococcal vaccine: decline ?Tdap vaccine: due ?Shingles vaccine: discussed   ?Covid-19:  08/03/2020, 09/24/2019, 09/03/2019 ? ?Advanced directives: copy in chart ? ?Conditions/risks identified: none ? ?Next appointment: Follow up in one year for your annual wellness visit.  ? ?Preventive Care 68 Years and Older, Male ?Preventive care refers to lifestyle choices and vinonesits with your health care provider that can promote health and wellness. ?What does preventive care include? ?A yearly physical exam. This is also called an annual well check. ?Dental exams once or twice a year. ?Routine eye exams. Ask your health care provider how often you should have your eyes checked. ?Personal lifestyle choices, including: ?Daily care of your teeth and gums. ?Regular physical activity. ?Eating a healthy diet. ?Avoiding tobacco and drug use. ?Limiting alcohol use. ?Practicing safe sex. ?Taking low doses of aspirin every day. ?Taking vitamin and mineral supplements as recommended by your health care provider. ?What happens during an annual well check? ?The services and screenings done by your health care provider during your annual well check will depend on your age, overall health, lifestyle risk factors, and family history of disease. ?Counseling  ?Your health care provider may ask you questions about your: ?Alcohol use. ?Tobacco use. ?Drug use. ?Emotional well-being. ?Home and relationship well-being. ?Sexual  activity. ?Eating habits. ?History of falls. ?Memory and ability to understand (cognition). ?Work and work Statistician. ?Screening  ?You may have the following tests or measurements: ?Height, weight, and BMI. ?Blood pressure. ?Lipid and cholesterol levels. These may be checked every 5 years, or more frequently if you are over 50 years old. ?Skin check. ?Lung cancer screening. You may have this screening every year starting at age 54 if you have a 30-pack-year history of smoking and currently smoke or have quit within the past 15 years. ?Fecal occult blood test (FOBT) of the stool. You may have this test every year starting at age 71. ?Flexible sigmoidoscopy or colonoscopy. You may have a sigmoidoscopy every 5 years or a colonoscopy every 10 years starting at age 62. ?Prostate cancer screening. Recommendations will vary depending on your family history and other risks. ?Hepatitis C blood test. ?Hepatitis B blood test. ?Sexually transmitted disease (STD) testing. ?Diabetes screening. This is done by checking your blood sugar (glucose) after you have not eaten for a while (fasting). You may have this done every 1-3 years. ?Abdominal aortic aneurysm (AAA) screening. You may need this if you are a current or former smoker. ?Osteoporosis. You may be screened starting at age 68 if you are at high risk. ?Talk with your health care provider about your test results, treatment options, and if necessary, the need for more tests. ?Vaccines  ?Your health care provider may recommend certain vaccines, such as: ?Influenza vaccine. This is recommended every year. ?Tetanus, diphtheria, and acellular pertussis (Tdap, Td) vaccine. You may need a Td booster every 10 years. ?Zoster vaccine. You may need this after age 66. ?Pneumococcal 13-valent conjugate (PCV13) vaccine. One dose is recommended after age 71. ?Pneumococcal polysaccharide (PPSV23) vaccine. One dose is recommended after age 73. ?Talk to your  health care provider about which  screenings and vaccines you need and how often you need them. ?This information is not intended to replace advice given to you by your health care provider. Make sure you discuss any questions you have with your health care provider. ?Document Released: 08/28/2015 Document Revised: 04/20/2016 Document Reviewed: 06/02/2015 ?Elsevier Interactive Patient Education ? 2017 [place]. ? ?Fall Prevention in the Home ?Falls can cause injuries. They can happen to people of all ages. There are many things you can do to make your home safe and to help prevent falls. ?What can I do on the outside of my home? ?Regularly fix the edges of walkways and driveways and fix any cracks. ?Remove anything that might make you trip as you walk through a door, such as a raised step or threshold. ?Trim any bushes or trees on the path to your home. ?Use bright outdoor lighting. ?Clear any walking paths of anything that might make someone trip, such as rocks or tools. ?Regularly check to see if handrails are loose or broken. Make sure that both sides of any steps have handrails. ?Any raised decks and porches should have guardrails on the edges. ?Have any leaves, snow, or ice cleared regularly. ?Use sand or salt on walking paths during winter. ?Clean up any spills in your garage right away. This includes oil or grease spills. ?What can I do in the bathroom? ?Use night lights. ?Install grab bars by the toilet and in the tub and shower. Do not use towel bars as grab bars. ?Use non-skid mats or decals in the tub or shower. ?If you need to sit down in the shower, use a plastic, non-slip stool. ?Keep the floor dry. Clean up any water that spills on the floor as soon as it happens. ?Remove soap buildup in the tub or shower regularly. ?Attach bath mats securely with double-sided non-slip rug tape. ?Do not have throw rugs and other things on the floor that can make you trip. ?What can I do in the bedroom? ?Use night lights. ?Make sure that you have a  light by your bed that is easy to reach. ?Do not use any sheets or blankets that are too big for your bed. They should not hang down onto the floor. ?Have a firm chair that has side arms. You can use this for support while you get dressed. ?Do not have throw rugs and other things on the floor that can make you trip. ?What can I do in the kitchen? ?Clean up any spills right away. ?Avoid walking on wet floors. ?Keep items that you use a lot in easy-to-reach places. ?If you need to reach something above you, use a strong step stool that has a grab bar. ?Keep electrical cords out of the way. ?Do not use floor polish or wax that makes floors slippery. If you must use wax, use non-skid floor wax. ?Do not have throw rugs and other things on the floor that can make you trip. ?What can I do with my stairs? ?Do not leave any items on the stairs. ?Make sure that there are handrails on both sides of the stairs and use them. Fix handrails that are broken or loose. Make sure that handrails are as long as the stairways. ?Check any carpeting to make sure that it is firmly attached to the stairs. Fix any carpet that is loose or worn. ?Avoid having throw rugs at the top or bottom of the stairs. If you do have throw rugs, attach them  to the floor with carpet tape. ?Make sure that you have a light switch at the top of the stairs and the bottom of the stairs. If you do not have them, ask someone to add them for you. ?What else can I do to help prevent falls? ?Wear shoes that: ?Do not have high heels. ?Have rubber bottoms. ?Are comfortable and fit you well. ?Are closed at the toe. Do not wear sandals. ?If you use a stepladder: ?Make sure that it is fully opened. Do not climb a closed stepladder. ?Make sure that both sides of the stepladder are locked into place. ?Ask someone to hold it for you, if possible. ?Clearly mark and make sure that you can see: ?Any grab bars or handrails. ?First and last steps. ?Where the edge of each step  is. ?Use tools that help you move around (mobility aids) if they are needed. These include: ?Canes. ?Walkers. ?Scooters. ?Crutches. ?Turn on the lights when you go into a dark area. Replace any light bulbs as

## 2021-12-07 NOTE — Progress Notes (Signed)
?This visit occurred during the SARS-CoV-2 public health emergency.  Safety protocols were in place, including screening questions prior to the visit, additional usage of staff PPE, and extensive cleaning of exam room while observing appropriate contact time as indicated for disinfecting solutions. ? ?Subjective:  ? Jermaine James. is a 66 y.o. male who presents for Medicare Annual/Subsequent preventive examination. ? ?Review of Systems    ? ?Cardiac Risk Factors include: advanced age (>9mn, >>3women);diabetes mellitus;dyslipidemia;hypertension;male gender;obesity (BMI >30kg/m2) ? ?   ?Objective:  ?  ?Today's Vitals  ? 12/07/21 0944 12/07/21 0949  ?BP: 118/70   ?Pulse: 79   ?Temp: 98.3 ?F (36.8 ?C)   ?TempSrc: Oral   ?SpO2: 96%   ?Weight: 297 lb 8 oz (134.9 kg)   ?Height: '5\' 11"'$  (1.803 m)   ?PainSc:  2   ? ?Body mass index is 41.49 kg/m?. ? ? ?  12/07/2021  ?  9:53 AM 11/10/2020  ?  3:27 PM 12/18/2019  ?  2:57 PM 11/21/2019  ?  3:15 PM 01/30/2019  ?  9:18 AM 05/14/2018  ?  2:49 AM 05/13/2018  ?  5:11 PM  ?Advanced Directives  ?Does Patient Have a Medical Advance Directive? Yes Yes Yes Yes Yes Yes Yes  ?Type of AParamedicof [place]Living will Healthcare Power of [place]Living will  [place]Living will [place]Living will   ?Does patient want to make changes to medical advance directive?     No - Patient declined No - Patient declined   ?Copy of [place] Chart? Yes - validated most recent copy scanned in chart (See row information) No - copy requested   No - copy requested No - copy requested   ?Would patient like information on creating a medical advance directive?   No - Patient declined      ? ? ?Current Medications (verified) ?Outpatient Encounter Medications as of 12/07/2021  ?Medication Sig  ? fenofibrate 160 MG tablet Take 1 tablet (160 mg total) by mouth daily.  ? hydrochlorothiazide  (HYDRODIURIL) 25 MG tablet Take 1 tablet (25 mg total) by mouth daily.  ? lisinopril (ZESTRIL) 20 MG tablet Take 1 tablet (20 mg total) by mouth daily. *appointment required for future refills*  ? metFORMIN (GLUCOPHAGE) 1000 MG tablet Take 1 tablet (1,000 mg total) by mouth 2 (two) times daily with a meal.  ? sildenafil (VIAGRA) 100 MG tablet TAKE 1 TABLET BY MOUTH AS NEEDED FOR ERECTILE DYSFUNCTION  ? ?No facility-administered encounter medications on file as of 12/07/2021.  ? ? ?Allergies (verified) ?Patient has no known allergies.  ? ?History: ?Past Medical History:  ?Diagnosis Date  ? Blind left eye   ? 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 Chronic kidney disease   ? 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 Dental crowns present   ? Family history of adverse reaction to anesthesia   ? pt's father has hx. of post-op N/V  ? High triglycerides   ? no current med.  ? History of gout   ? History of kidney stones   ? Hypertension   ? states under control with med., has been on med. ~ 238yr.  ? Non-insulin dependent type 2 diabetes mellitus ([place]   ? Obesity, Class III, BMI 40-49.9 (morbid obesity) ([place]   ? OSA on CPAP   ? Osteoarthritis   ? knees (09/14/2016)  ? Sleep apnea   ?  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 ?Past Surgical History:  ?Procedure Laterality Date  ? cancerous mole removed-back  09/2019  ? COLONOSCOPY  08/29/2005  ? per Dr. Sharlett Iles, diverticulosis only, repeat in 10 yrs   ? CYSTOSCOPY  2011  ? "related to trace blood in urine"  ? EYE SURGERY Left 1961  ? traumatic loss of eye  ? INGUINAL HERNIA REPAIR Left   ? KNEE ARTHROSCOPY Left 1990s?  ? KNEE ARTHROSCOPY WITH MEDIAL MENISECTOMY Right 11/08/2012  ? Procedure: RIGHT KNEE ARTHROSCOPY WITH PARTIAL MEDIAL AND LATERAL MENISECTOMIES, CHONDROPLASTY;  Surgeon: [name], MD;  Location: [facility];  Service: Orthopedics;  Laterality: Right;  RIGHT KNEE SCOPE WITH PARTIAL MEDIAL AND LATERAL  MENISCECTOMIES  AND  CHONDROPLASTY  ? KNEE CLOSED REDUCTION Left 08/18/2016  ? Procedure: LEFT  MANIPULATION KNEE;  Surgeon: [name], MD;  Location: [facility];  Service: Orthopedics;  Laterality: Left;  ? LIPOMA EXCISION Left   ? upper back  ? MOHS SURGERY Left 10/2015  ? "forehead"  ? RETINAL TEAR REPAIR CRYOTHERAPY Right 11/2017  ? SPINE SURGERY  02/07/2019  ? TOTAL KNEE ARTHROPLASTY Left 05/18/2016  ? Procedure: LEFT TOTAL KNEE ARTHROPLASTY;  Surgeon: [name], MD;  Location: [facility];  Service: Orthopedics;  Laterality: Left;  ? TOTAL KNEE ARTHROPLASTY Right 09/14/2016  ? Procedure: TOTAL KNEE ARTHROPLASTY MANIPULATION LEFT KNEE;  Surgeon: [name], MD;  Location: [facility];  Service: Orthopedics;  Laterality: Right;  ? ?Family History  ?Problem Relation Age of Onset  ? Anesthesia problems Father   ?     post-op N/V  ? Colon cancer Neg Hx   ? Esophageal cancer Neg Hx   ? Rectal cancer Neg Hx   ? Stomach cancer Neg Hx   ? ?Social History  ? ?Socioeconomic History  ? Marital status: Married  ?  Spouse name: Not on file  ? Number of children: Not on file  ? Years of education: Not on file  ? Highest education level: Bachelor's degree (e.g., BA, AB, BS)  ?Occupational History  ? Occupation: Customer service manager  ?  Comment: retired  ?Tobacco Use  ? Smoking status: Never  ? Smokeless tobacco: Never  ?Vaping Use  ? Vaping Use: Never used  ?Substance and Sexual Activity  ? Alcohol use: Yes  ?  Alcohol/week: 3.0 - 4.0 standard drinks  ?  Types: 1 Glasses of wine, 1 Cans of beer, 1 - 2 Standard drinks or equivalent per week  ? Drug use: No  ? Sexual activity: Yes  ?Other Topics Concern  ? Not on file  ?Social History Narrative  ? Not on file  ? ?Social Determinants of Health  ? ?Financial Resource Strain: Low Risk   ? Difficulty of Paying Living Expenses: Not hard at all  ?Food Insecurity: No Food Insecurity  ? Worried About Charity fundraiser in the Last Year: Never true  ? Ran Out of Food in the Last Year: Never true   ?Transportation Needs: No Transportation Needs  ? Lack of Transportation (Medical): No  ? Lack of Transportation (Non-Medical): No  ?Physical Activity: Sufficiently Active  ? Days of Exercise per Week: 3 days  ? Minutes of Exercise per Session: 50 min  ?Stress: No Stress Concern Present  ? Feeling of Stress : Not at all  ?Social Connections: Socially Integrated  ? Frequency of Communication with Friends and Family: More than three times a  week  ? Frequency of Social Gatherings with Friends and Family: Once a week  ? Attends Religious Services: More than 4 times per year  ? Active Member of Clubs or Organizations: Yes  ? Attends Archivist Meetings: 1 to 4 times per year  ? Marital Status: Married  ? ? ?Tobacco Counseling ?Counseling given: Not Answered ? ? ?Clinical Intake: ? ?Pre-visit preparation completed: Yes ? ?Pain : 0-10 ?Pain Score: 2  ?Pain Type: Chronic pain ?Pain Location: Ankle ?Pain Orientation: Left, Right ?Pain Descriptors / Indicators: Sharp ?Pain Onset: More than a month ago ?Pain Frequency: Constant ?Pain Relieving Factors: exercises ? ?Pain Relieving Factors: exercises ? ?Nutritional Status: BMI > 30  Obese ?Nutritional Risks: None ?Diabetes: Yes ? ?How often do you need to have someone help you when you read instructions, pamphlets, or other written materials from your doctor or pharmacy?: 1 - Never ?What is the last grade level you completed in school?: bachelor's degree ? ?Diabetic? Yes ?Nutrition Risk Assessment: ? ?Has the patient had any N/V/D within the last 2 months?  No  ?Does the patient have any non-healing wounds?  No  ?Has the patient had any unintentional weight loss or weight gain?  No  ? ?Diabetes: ? ?Is the patient diabetic?  Yes  ?If diabetic, was a CBG obtained today?  No  ?Did the patient bring in their glucometer from home?  No  ?How often do you monitor your CBG's? daily.  ? ?Financial Strains and Diabetes Management: ? ?Are you having any financial strains with  the device, your supplies or your medication? No .  ?Does the patient want to be seen by Chronic Care Management for management of their diabetes?  No  ?Would the patient like to be referred to a Nutritionist or for Diabetic Ma

## 2021-12-08 ENCOUNTER — Other Ambulatory Visit (HOSPITAL_COMMUNITY): Payer: Self-pay

## 2021-12-08 ENCOUNTER — Encounter: Payer: Self-pay | Admitting: Family Medicine

## 2021-12-09 ENCOUNTER — Other Ambulatory Visit (HOSPITAL_COMMUNITY): Payer: Self-pay

## 2021-12-09 MED ORDER — "LUER LOCK SAFETY SYRINGES 22G X 1-1/2"" 3 ML MISC"
1.0000 "application " | 5 refills | Status: DC
  Filled 2021-12-09: qty 50, fill #0

## 2021-12-09 MED ORDER — LISINOPRIL-HYDROCHLOROTHIAZIDE 20-25 MG PO TABS
1.0000 | ORAL_TABLET | Freq: Every day | ORAL | 3 refills | Status: DC
  Filled 2021-12-09: qty 90, 90d supply, fill #0
  Filled 2022-03-09: qty 90, 90d supply, fill #1
  Filled 2022-06-10: qty 90, 90d supply, fill #2

## 2021-12-09 MED ORDER — TESTOSTERONE CYPIONATE 200 MG/ML IM SOLN
200.0000 mg | INTRAMUSCULAR | 1 refills | Status: DC
  Filled 2021-12-09: qty 6, 84d supply, fill #0
  Filled 2022-02-21 – 2022-03-01 (×4): qty 6, 84d supply, fill #1

## 2021-12-09 NOTE — Telephone Encounter (Signed)
I had ordered the 20-25 size of Zestoretic, so I don't understand why he was given the wrong dose. I resent a RX for the 20-25 dose so he should make sure that is what he is given. I also sent in a RX to begin testosterone shots every 14 days. We should check another level in 90 days  ?

## 2021-12-10 ENCOUNTER — Ambulatory Visit (INDEPENDENT_AMBULATORY_CARE_PROVIDER_SITE_OTHER): Payer: Medicare Other

## 2021-12-10 ENCOUNTER — Telehealth: Payer: Self-pay | Admitting: Family Medicine

## 2021-12-10 DIAGNOSIS — E349 Endocrine disorder, unspecified: Secondary | ICD-10-CM

## 2021-12-10 DIAGNOSIS — E291 Testicular hypofunction: Secondary | ICD-10-CM | POA: Diagnosis not present

## 2021-12-10 MED ORDER — TESTOSTERONE CYPIONATE 200 MG/ML IM SOLN
200.0000 mg | INTRAMUSCULAR | Status: DC
  Administered 2021-12-10: 200 mg via INTRAMUSCULAR

## 2021-12-10 NOTE — Telephone Encounter (Signed)
Pt called stating he checked with his INS and they cover testosterone inj. So he is calling to set up an appt to get his first shot. I advise that I would need to send a msg back for the CMA to sched his appt since this is his first injection and that they will be giving him a call.  ? ?Please advise.  ?

## 2021-12-10 NOTE — Telephone Encounter (Signed)
Spoke with pt scheduled for Testosterone injection this afternoon per Dr Sarajane Jews, pt will bring his own bottle ?

## 2021-12-24 ENCOUNTER — Ambulatory Visit (INDEPENDENT_AMBULATORY_CARE_PROVIDER_SITE_OTHER): Payer: Medicare Other

## 2021-12-24 DIAGNOSIS — E349 Endocrine disorder, unspecified: Secondary | ICD-10-CM

## 2021-12-24 DIAGNOSIS — E291 Testicular hypofunction: Secondary | ICD-10-CM

## 2021-12-24 MED ORDER — TESTOSTERONE CYPIONATE 200 MG/ML IM SOLN
200.0000 mg | INTRAMUSCULAR | Status: DC
  Administered 2021-12-24 – 2022-02-18 (×5): 200 mg via INTRAMUSCULAR

## 2021-12-24 NOTE — Progress Notes (Signed)
Per orders of Dr. Sarajane Jews, injection of Testosterone given by Wyvonne Lenz. ?Patient tolerated injection well. ?

## 2022-01-07 ENCOUNTER — Ambulatory Visit (INDEPENDENT_AMBULATORY_CARE_PROVIDER_SITE_OTHER): Payer: Medicare Other

## 2022-01-07 DIAGNOSIS — E291 Testicular hypofunction: Secondary | ICD-10-CM

## 2022-01-07 NOTE — Progress Notes (Signed)
Jermaine James. is a 66 y.o. male presents to the office today for Testosterone injections, per Dr Barbie Banner orders. Original order: testosterone cypionate (DEPOTESTOSTERONE CYPIONATE) Inject 1 mL (200 mg total) into the muscle every 14 (fourteen) days. Testosterone '200mg'$  IM was administered left ventrogluteal today. Patient tolerated injection. Patient due for follow up labs/provider appt: No. Patient next injection due: 01/21/22, appt made Yes  [name]

## 2022-01-21 ENCOUNTER — Ambulatory Visit (INDEPENDENT_AMBULATORY_CARE_PROVIDER_SITE_OTHER): Payer: Medicare Other

## 2022-01-21 ENCOUNTER — Other Ambulatory Visit (HOSPITAL_COMMUNITY): Payer: Self-pay

## 2022-01-21 DIAGNOSIS — E291 Testicular hypofunction: Secondary | ICD-10-CM | POA: Diagnosis not present

## 2022-01-21 NOTE — Progress Notes (Signed)
Jermaine James. is a 66 y.o. male presents to the office today for Testosterone injections, per Dr Barbie Banner orders. Original order: testosterone cypionate (DEPOTESTOSTERONE CYPIONATE) Inject 1 mL (200 mg total) into the muscle every 14 (fourteen) days. Testosterone '200mg'$  IM was administered left ventrogluteal today. Patient tolerated injection. Patient due for follow up labs/provider appt: No. Patient next injection due: 02/04/22, appt made Yes   [name]

## 2022-02-04 ENCOUNTER — Ambulatory Visit (INDEPENDENT_AMBULATORY_CARE_PROVIDER_SITE_OTHER): Payer: Medicare Other

## 2022-02-04 DIAGNOSIS — E291 Testicular hypofunction: Secondary | ICD-10-CM | POA: Diagnosis not present

## 2022-02-04 NOTE — Progress Notes (Signed)
Jermaine James. is a 66 y.o. male presents to the office today for Testosterone injections, per Dr Barbie Banner orders. Original order: testosterone cypionate (DEPOTESTOSTERONE CYPIONATE) Inject 1 mL (200 mg total) into the muscle every 14 (fourteen) days. Testosterone '200mg'$  IM was administered left ventrogluteal today. Patient tolerated injection. Patient due for follow up labs/provider appt: No. Patient next injection due: 02/18/22, appt made Yes   [name]   This was given per my order.  Alysia Penna, MD

## 2022-02-18 ENCOUNTER — Telehealth: Payer: Self-pay

## 2022-02-18 ENCOUNTER — Ambulatory Visit (INDEPENDENT_AMBULATORY_CARE_PROVIDER_SITE_OTHER): Payer: Medicare Other

## 2022-02-18 DIAGNOSIS — E291 Testicular hypofunction: Secondary | ICD-10-CM | POA: Diagnosis not present

## 2022-02-18 NOTE — Telephone Encounter (Signed)
Keep taking the shots. We should check another blood level at the  6 months point (late October)

## 2022-02-18 NOTE — Progress Notes (Signed)
Per orders of Laurey Morale, MD, injection of testosterone given in   Left Ventrogluteal by Karli Wickizer D Arad Burston. Patient tolerated injection well.

## 2022-02-18 NOTE — Telephone Encounter (Signed)
Patient asked if nurse would give him a call to discuss next step with testosterone injection Please advise

## 2022-02-18 NOTE — Telephone Encounter (Signed)
Send pt a MyChart message regarding Dr Sarajane Jews advise

## 2022-02-18 NOTE — Telephone Encounter (Signed)
Last Testosterone labs- 12/07/21.   Should patient continue with injections?   Please advise

## 2022-02-21 ENCOUNTER — Other Ambulatory Visit (HOSPITAL_COMMUNITY): Payer: Self-pay

## 2022-02-21 ENCOUNTER — Telehealth: Payer: Self-pay | Admitting: Family Medicine

## 2022-02-21 NOTE — Telephone Encounter (Signed)
Patient currently has another prescription on file for Testosterone at [facility].    Appointment for nurse visit testosterone injection scheduled for 03/04/22.   Message complete.    Below message from Dr. Sarajane Jews from telephone message from 02/18/22, concerning continuing testosterone injections:  Keep taking the shots. We should check another blood level at the  6 months point (late October)

## 2022-02-21 NOTE — Telephone Encounter (Signed)
Pt called to ask if he could resume receiving the shots (Testosterone). On Epic, it says "completed" so I thought maybe he needed a new order.   Please advise.

## 2022-02-23 ENCOUNTER — Other Ambulatory Visit (HOSPITAL_COMMUNITY): Payer: Self-pay

## 2022-03-01 ENCOUNTER — Other Ambulatory Visit (HOSPITAL_COMMUNITY): Payer: Self-pay

## 2022-03-04 ENCOUNTER — Ambulatory Visit (INDEPENDENT_AMBULATORY_CARE_PROVIDER_SITE_OTHER): Payer: Medicare Other

## 2022-03-04 DIAGNOSIS — E291 Testicular hypofunction: Secondary | ICD-10-CM | POA: Diagnosis not present

## 2022-03-04 DIAGNOSIS — E349 Endocrine disorder, unspecified: Secondary | ICD-10-CM

## 2022-03-04 MED ORDER — TESTOSTERONE CYPIONATE 200 MG/ML IM SOLN
200.0000 mg | INTRAMUSCULAR | Status: DC
  Administered 2022-03-04: 200 mg via INTRAMUSCULAR

## 2022-03-04 NOTE — Progress Notes (Signed)
Per orders of Dr. Sarajane Jews, injection of Testosterone Cypionate 200 mL given by Wyvonne Lenz. Patient tolerated injection well.

## 2022-03-09 ENCOUNTER — Other Ambulatory Visit (HOSPITAL_COMMUNITY): Payer: Self-pay

## 2022-03-18 ENCOUNTER — Ambulatory Visit (INDEPENDENT_AMBULATORY_CARE_PROVIDER_SITE_OTHER): Payer: Medicare Other | Admitting: *Deleted

## 2022-03-18 DIAGNOSIS — E349 Endocrine disorder, unspecified: Secondary | ICD-10-CM | POA: Diagnosis not present

## 2022-03-18 MED ORDER — TESTOSTERONE CYPIONATE 200 MG/ML IM SOLN
200.0000 mg | Freq: Once | INTRAMUSCULAR | Status: AC
  Administered 2022-03-18: 200 mg via INTRAMUSCULAR

## 2022-03-18 NOTE — Progress Notes (Signed)
Per orders of Dr. Fry, injection of Testosterone cypionate 200mg given by Jennyfer Nickolson A. Patient tolerated injection well.  

## 2022-03-31 ENCOUNTER — Ambulatory Visit: Payer: Medicare Other | Admitting: Internal Medicine

## 2022-04-08 ENCOUNTER — Ambulatory Visit (INDEPENDENT_AMBULATORY_CARE_PROVIDER_SITE_OTHER): Payer: Medicare Other | Admitting: *Deleted

## 2022-04-08 DIAGNOSIS — E349 Endocrine disorder, unspecified: Secondary | ICD-10-CM

## 2022-04-08 DIAGNOSIS — E291 Testicular hypofunction: Secondary | ICD-10-CM

## 2022-04-08 MED ORDER — TESTOSTERONE CYPIONATE 200 MG/ML IM SOLN
200.0000 mg | Freq: Once | INTRAMUSCULAR | Status: AC
  Administered 2022-04-08: 200 mg via INTRAMUSCULAR

## 2022-04-08 NOTE — Progress Notes (Signed)
Per orders of Dr. Sarajane Jews, injection of Testosterone '200mg'$  given by Agnes Lawrence. Patient tolerated injection well.

## 2022-04-22 ENCOUNTER — Ambulatory Visit (INDEPENDENT_AMBULATORY_CARE_PROVIDER_SITE_OTHER): Payer: Medicare Other

## 2022-04-22 DIAGNOSIS — E291 Testicular hypofunction: Secondary | ICD-10-CM

## 2022-04-22 DIAGNOSIS — E349 Endocrine disorder, unspecified: Secondary | ICD-10-CM

## 2022-04-22 DIAGNOSIS — N529 Male erectile dysfunction, unspecified: Secondary | ICD-10-CM

## 2022-04-22 MED ORDER — TESTOSTERONE CYPIONATE 200 MG/ML IM SOLN
200.0000 mg | INTRAMUSCULAR | Status: DC
  Administered 2022-04-22 – 2022-05-20 (×3): 200 mg via INTRAMUSCULAR

## 2022-04-22 NOTE — Progress Notes (Signed)
Per orders of Dr. Sarajane Jews, injection of Testosterone Cypionate '200mg'$   given by Encarnacion Slates. Patient tolerated injection well.

## 2022-04-25 ENCOUNTER — Other Ambulatory Visit (HOSPITAL_COMMUNITY): Payer: Self-pay

## 2022-05-06 ENCOUNTER — Ambulatory Visit (INDEPENDENT_AMBULATORY_CARE_PROVIDER_SITE_OTHER): Payer: Medicare Other

## 2022-05-06 DIAGNOSIS — E291 Testicular hypofunction: Secondary | ICD-10-CM

## 2022-05-06 NOTE — Progress Notes (Signed)
Jermaine James. is a 66 y.o. male presents to the office today for Testosterone injections per Dr Barbie Banner orders. Original order: testosterone cypionate (DEPOTESTOSTERONE CYPIONATE) 200 MG/ML every 14 days Testosterone '200mg'$  IM was administered left ventrogluteal today. Patient tolerated injection. Patient due for follow up labs/provider appt: Yes. Date due: Now, appt made No. Pt prefers to wait after next dose for repeat testosterone. See phone encounter note from 12/08/21: I also sent in a RX to begin testosterone shots every 14 days. We should check another level in 90 days  Patient next injection due: 05/20/22, appt made Yes  [name]

## 2022-05-20 ENCOUNTER — Ambulatory Visit (INDEPENDENT_AMBULATORY_CARE_PROVIDER_SITE_OTHER): Payer: Medicare Other

## 2022-05-20 DIAGNOSIS — E291 Testicular hypofunction: Secondary | ICD-10-CM | POA: Diagnosis not present

## 2022-05-20 DIAGNOSIS — E349 Endocrine disorder, unspecified: Secondary | ICD-10-CM | POA: Diagnosis not present

## 2022-05-20 NOTE — Progress Notes (Signed)
Jermaine James. is a 66 y.o. male presents to the office today for Testosterone injections, per Dr Barbie Banner orders. Original order: testosterone cypionate (DEPOTESTOSTERONE CYPIONATE) Inject 1 mL (200 mg total) into the muscle every 14 (fourteen) days. Testosterone '200mg'$  IM was administered right ventrogluteal today. Patient tolerated injection. Patient due for follow up labs/provider appt: Yes. Appt made for 06/10/22 at [facility]

## 2022-06-03 ENCOUNTER — Encounter: Payer: Self-pay | Admitting: Internal Medicine

## 2022-06-03 ENCOUNTER — Ambulatory Visit (INDEPENDENT_AMBULATORY_CARE_PROVIDER_SITE_OTHER): Payer: Medicare Other | Admitting: Internal Medicine

## 2022-06-03 VITALS — BP 130/80 | HR 80 | Ht 71.0 in | Wt 300.0 lb

## 2022-06-03 DIAGNOSIS — E1165 Type 2 diabetes mellitus with hyperglycemia: Secondary | ICD-10-CM

## 2022-06-03 DIAGNOSIS — E66813 Obesity, class 3: Secondary | ICD-10-CM

## 2022-06-03 DIAGNOSIS — E785 Hyperlipidemia, unspecified: Secondary | ICD-10-CM

## 2022-06-03 LAB — POCT GLYCOSYLATED HEMOGLOBIN (HGB A1C): Hemoglobin A1C: 6.1 % — AB (ref 4.0–5.6)

## 2022-06-03 NOTE — Patient Instructions (Signed)
Please continue: - Metformin 1000 mg 2x a day with meals  Stop sweet drinks!  Please return in 4-6 months with your sugar log.

## 2022-06-03 NOTE — Progress Notes (Signed)
Patient ID: Jermaine James., male   DOB: 1955-10-23, 66 y.o.   MRN: [id number]   HPI: Jermaine James. is a 66 y.o.-year-old male, initially referred by his PCP, Dr. Sarajane Jews, returning for follow-up for DM2, dx in 2012, non-insulin-dependent, now controlled, without long-term complications. He is the husband of Jermaine James, who is also my pt.  Last visit 6 months ago.  Interim history: No increased urination, blurry vision, nausea, chest pain. He is back to drinking regular sodas - 1-2 x a week.   Reviewed HbA1c level: Lab Results  Component Value Date   HGBA1C 6.7 (H) 12/07/2021   HGBA1C 6.4 (A) 07/27/2021   HGBA1C 6.1 (A) 02/16/2021   HGBA1C 6.5 (A) 09/29/2020   HGBA1C 6.7 (A) 05/28/2020   HGBA1C 6.2 (A) 01/22/2020   HGBA1C 7.6 (A) 10/18/2019   HGBA1C 8.3 (H) 07/18/2019   HGBA1C 7.6 (H) 01/30/2019   HGBA1C 6.8 (H) 02/01/2018   HGBA1C 7.0 (H) 09/02/2016   HGBA1C 6.5 04/20/2016   HGBA1C 6.1 05/12/2015   HGBA1C 6.5 01/16/2014   HGBA1C 7.0 (H) 08/20/2012   HGBA1C 6.5 (H) 07/02/2012   HGBA1C 6.6 (H) 07/05/2011   HGBA1C 6.4 06/23/2010   HGBA1C 6.4 10/30/2009   HGBA1C 6.4 07/24/2009   HGBA1C 6.3 (H) 04/20/2007   Pt is on a regimen of: - Metformin 500 mg 2x a day, with meals >> 1000 mg twice a day  He checks sugars 0 to 1x a day: - am: 110-128, 135 >> 116-125, 137 >> 101-128 >> 106-128 - 2h after b'fast:  118-135 >> 115-138, 177 >> 121-147 >> n/c - before lunch:  106-123 >> 118-140 >> 125-147 >> 107-128 - 2h after lunch:  118-139, 166 >> 128-140 >> 126-165 >> 122 - before dinner:  110-142 >> 116-132, 165 >> 137, 144 >> 115-136 - 2h after dinner: 132-169, 168 >> 144-197 >> 149-161, 168 - bedtime:  119-213, 239 >> 139-168, 217 >> n/c - nighttime: n/c Lowest sugar was 98 ... >> 103 Highest sugar was 239 (pizza, Soda) ... >> 168  Glucometer: none >> True Focus  Pt's meals are: - Breakfast: cheerios + 1-2% milk; pancake + waffle + bacon; - Lunch: sandwich -  PB/turkey/nan - Dinner: meat + starch + veggies - Snacks: 1-2x a day -  -No CKD, last BUN/creatinine:  Lab Results  Component Value Date   BUN 15 12/07/2021   BUN 16 09/29/2020   CREATININE 0.85 12/07/2021   CREATININE 0.96 09/29/2020   No MAU: Lab Results  Component Value Date   MICRALBCREAT 0.8 09/29/2020   MICRALBCREAT 1.7 01/22/2020   MICRALBCREAT 1.0 05/12/2015   MICRALBCREAT 0.7 08/20/2012   MICRALBCREAT 0.3 07/05/2011   MICRALBCREAT 4.3 07/24/2009   MICRALBCREAT 4.3 04/20/2007  On lisinopril 20.  -+ HL; last set of lipids: Lab Results  Component Value Date   CHOL 135 12/07/2021   HDL 48.00 12/07/2021   LDLCALC 60 12/07/2021   LDLDIRECT 86.0 02/01/2018   TRIG 139.0 12/07/2021   CHOLHDL 3 12/07/2021  On fenofibrate 160. He refused statins.  - last eye exam was in 2023: No DR. Artificial eye OS.  + Cataract OD >> had sx.  - no numbness and tingling in his feet. Last foot exam 10/27/2021 - Dr. Blenda Mounts.  Pt has no FH of DM.  He also has a history of OSA-on CPAP, gout, HTN.  ROS: + see HPI  I reviewed pt's medications, allergies, PMH, social hx, family hx, and changes were documented  in the history of present illness. Otherwise, unchanged from my initial visit note.  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 of post-op N/V   High triglycerides    no current med.   History of gout    History of kidney stones    Hypertension    states under control with med., has been on med. ~ 61 yr.   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 Hilarie Fredrickson, adenomatous polyps, repeat in 5 yrs   CYSTOSCOPY  2011   "related to trace blood in urine"   EYE SURGERY Left 1961   traumatic loss of eye   INGUINAL HERNIA REPAIR Left    KNEE ARTHROSCOPY Left 1990s?   KNEE ARTHROSCOPY WITH MEDIAL MENISECTOMY Right 11/08/2012   Procedure: RIGHT KNEE ARTHROSCOPY WITH PARTIAL MEDIAL AND LATERAL MENISECTOMIES, CHONDROPLASTY;  Surgeon: [name], MD;  Location: [facility];  Service: Orthopedics;  Laterality: Right;  RIGHT KNEE SCOPE WITH PARTIAL MEDIAL AND LATERAL  MENISCECTOMIES  AND CHONDROPLASTY   KNEE CLOSED REDUCTION Left 08/18/2016   Procedure: LEFT  MANIPULATION KNEE;  Surgeon: [name], MD;  Location: [facility];  Service: Orthopedics;  Laterality: Left;   LIPOMA EXCISION Left    upper back   MOHS SURGERY Left 10/2015   "forehead"   RETINAL TEAR REPAIR CRYOTHERAPY Right 11/2017   SPINE SURGERY  02/07/2019   TOTAL KNEE ARTHROPLASTY Left 05/18/2016   Procedure: LEFT TOTAL KNEE ARTHROPLASTY;  Surgeon: [name], MD;  Location: [facility];  Service: Orthopedics;  Laterality: Left;   TOTAL KNEE ARTHROPLASTY Right 09/14/2016   Procedure: TOTAL KNEE ARTHROPLASTY MANIPULATION LEFT KNEE;  Surgeon: [name], MD;  Location: [facility];  Service: Orthopedics;  Laterality: Right;   Social History   Socioeconomic History   Marital status: Married    Spouse name: Not on file   Number of children: 3   Years of education: Not on file   Highest education level: Not on file  Occupational History   Occupation: Customer service manager -retired  Tobacco Use   Smoking status: Never Smoker   Smokeless tobacco: Never Used  Substance and Sexual Activity   Alcohol use: Yes    Alcohol/week: 3.0 - 4.0 standard drinks    Types: Beer, wine, mixed   Drug use: No   Sexual activity: Yes  Other Topics Concern   Not on file   Social History Narrative   Not on file   Social Determinants of Health   Financial Resource Strain:    Difficulty of Paying Living Expenses: Not on file  Food Insecurity:    Worried About [place] in the Last Year: Not on file   Ran Out of Food in the Last Year: Not on file  Transportation Needs:    Lack  of Transportation (Medical): Not on file   Lack of Transportation (Non-Medical): Not on file  Physical Activity:    Days of Exercise per Week: Not on file   Minutes of Exercise per Session: Not on file  Stress:    Feeling of Stress : Not on file  Social Connections:    Frequency of Communication with Friends and Family: Not on file   Frequency of Social Gatherings with Friends and Family: Not on file   Attends Religious Services: Not on file   Active Member of Clubs or Organizations: Not on file   Attends Archivist Meetings: Not on file   Marital Status: Not on file  Intimate Partner Violence:    Fear of Current or Ex-Partner: Not on file   Emotionally Abused: Not on file   Physically Abused: Not on file   Sexually Abused: Not on file   Current Outpatient Medications on File Prior to Visit  Medication Sig Dispense Refill   fenofibrate 160 MG tablet Take 1 tablet (160 mg total) by mouth daily. 90 tablet 3   lisinopril-hydrochlorothiazide (ZESTORETIC) 20-25 MG tablet Take 1 tablet by mouth daily. 90 tablet 3   metFORMIN (GLUCOPHAGE) 1000 MG tablet Take 1 tablet (1,000 mg total) by mouth 2 (two) times daily with a meal. 180 tablet 3   SYRINGE-NEEDLE, DISP, 3 ML (LUER LOCK SAFETY SYRINGES) 22G X 1-1/2" 3 ML MISC Use as directed 50 each 5   tadalafil (CIALIS) 20 MG tablet Take 0.5-1 tablets (10-20 mg total) by mouth daily as needed for erectile dysfunction. 10 tablet 11   testosterone cypionate (DEPOTESTOSTERONE CYPIONATE) 200 MG/ML injection Inject 1 mL (200 mg total) into the muscle every 14 (fourteen) days. 10 mL 1   Current Facility-Administered  Medications on File Prior to Visit  Medication Dose Route Frequency Provider Last Rate Last Admin   testosterone cypionate (DEPOTESTOSTERONE CYPIONATE) injection 200 mg  200 mg Intramuscular Weekly Laurey Morale, MD   200 mg at 12/10/21 1513   testosterone cypionate (DEPOTESTOSTERONE CYPIONATE) injection 200 mg  200 mg Intramuscular Q14 Days Laurey Morale, MD   200 mg at 02/18/22 1015   testosterone cypionate (DEPOTESTOSTERONE CYPIONATE) injection 200 mg  200 mg Intramuscular Q14 Days Laurey Morale, MD   200 mg at 03/04/22 1039   testosterone cypionate (DEPOTESTOSTERONE CYPIONATE) injection 200 mg  200 mg Intramuscular Q14 Days Laurey Morale, MD   200 mg at 05/20/22 1006   No Known Allergies Family History  Problem Relation Age of Onset   Anesthesia problems Father        post-op N/V   Colon cancer Neg Hx    Esophageal cancer Neg Hx    Rectal cancer Neg Hx    Stomach cancer Neg Hx    PE: BP 130/80 (BP Location: Right Arm, Patient Position: Sitting, Cuff Size: Normal)   Pulse 80   Ht '5\' 11"'$  (1.803 m)   Wt 300 lb (136.1 kg)   SpO2 96%   BMI 41.84 kg/m  Wt Readings from Last 3 Encounters:  06/03/22 300 lb (136.1 kg)  12/07/21 296 lb (134.3 kg)  12/07/21 297 lb 8 oz (134.9 kg)   Constitutional: overweight, in NAD Eyes:  EOMI, no exophthalmos; L eye prosthetic ENT: no neck masses, no cervical lymphadenopathy Cardiovascular: RRR, No MRG Respiratory: CTA B Musculoskeletal: no deformities Skin:no rashes Neurological: no tremor with outstretched hands  ASSESSMENT: 1. DM2, non-insulin-dependent, now controlled, without long-term complications, but with hyperglycemia  2. HL  3.  Obesity class III  PLAN:  1. Patient with longstanding, previously uncontrolled, type 2 diabetes, on oral antidiabetic regimen with metformin only, with improved blood sugar control in the last 2 years. In the past, he was having high blood sugars after cereals in the morning and we discussed about  healthier options for breakfast.  We also discussed about reducing pizza and snacks as much as possible.  At last visit we again discussed about stopping sweet drinks, also.  At that time, sugars were stable, fluctuating in a very narrow range.  The majority of the blood sugars were at goal but he occasionally had a slightly higher blood sugar before meals, in the 130s to 140s, many times after dietary indiscretions.  We did not change his regimen.  HbA1c was at goal, at 6.7%. -At today's visit, sugars remain well controlled, all at goal.  We will continue the same dose of metformin, which she is tolerating well.  We again discussed about the need to stop sweet drinks.  He is not drinking regular sodas 1-2 times a week. -I suggested to: Patient Instructions  Please continue: - Metformin 1000 mg 2x a day with meals  Stop sweet drinks!  Please return in 4-6 months with your sugar log.   - we checked his HbA1c: 6.1% (lower) - advised to check sugars at different times of the day - 1x a day, rotating check times - advised for yearly eye exams >> he is UTD - he is not planning to have another appointment with podiatry.  In this case, we will check a foot exam at next visit.  As of now, he is up-to-date. - return to clinic in 4-6 months  2. HL -Reviewed latest lipid panel from 11/2021: All fractions at goal: Lab Results  Component Value Date   CHOL 135 12/07/2021   HDL 48.00 12/07/2021   LDLCALC 60 12/07/2021   LDLDIRECT 86.0 02/01/2018   TRIG 139.0 12/07/2021   CHOLHDL 3 12/07/2021  -He is not on a statin (refused despite a discussion about benefits beyond lowering cholesterol) but is on fenofibrate 160 mg daily  3.  Obesity class III - we will continue metformin which has a mild appetite suppressant effect long-term -At last visit I again advised him to stop sweet drinks -at last OV, he was -4 pounds; since last visit, he gained 4 pounds.  Philemon Kingdom, MD PhD [facility]  Endocrinology

## 2022-06-10 ENCOUNTER — Other Ambulatory Visit (INDEPENDENT_AMBULATORY_CARE_PROVIDER_SITE_OTHER): Payer: Medicare Other

## 2022-06-10 ENCOUNTER — Telehealth: Payer: Self-pay | Admitting: Family Medicine

## 2022-06-10 ENCOUNTER — Other Ambulatory Visit (HOSPITAL_COMMUNITY): Payer: Self-pay

## 2022-06-10 DIAGNOSIS — E349 Endocrine disorder, unspecified: Secondary | ICD-10-CM | POA: Diagnosis not present

## 2022-06-10 LAB — TESTOSTERONE: Testosterone: 151.18 ng/dL — ABNORMAL LOW (ref 300.00–890.00)

## 2022-06-10 MED ORDER — TESTOSTERONE CYPIONATE 200 MG/ML IM SOLN
200.0000 mg | INTRAMUSCULAR | 5 refills | Status: DC
  Filled 2022-06-10: qty 10, 70d supply, fill #0
  Filled 2022-08-10: qty 4, 28d supply, fill #1
  Filled 2022-08-10: qty 6, 42d supply, fill #1

## 2022-06-10 MED ORDER — TADALAFIL 20 MG PO TABS: 20.0000 mg | ORAL_TABLET | ORAL | 11 refills | Status: DC | PRN

## 2022-06-10 NOTE — Telephone Encounter (Signed)
The RX is ready to be picked up

## 2022-06-10 NOTE — Telephone Encounter (Signed)
Done

## 2022-06-10 NOTE — Telephone Encounter (Signed)
Patient stopped by to do labs this morning and wanted to know if Dr.Fry could write a new prescription for the Tadalifil. Patient never filled the original prescription and would like another hard copy to take to pharmacies to see who has the cheapest price. Patient would like a call when ready for pick up.        Please advise

## 2022-06-14 ENCOUNTER — Other Ambulatory Visit (HOSPITAL_COMMUNITY): Payer: Self-pay

## 2022-06-14 ENCOUNTER — Other Ambulatory Visit: Payer: Self-pay

## 2022-06-14 DIAGNOSIS — E349 Endocrine disorder, unspecified: Secondary | ICD-10-CM

## 2022-06-15 ENCOUNTER — Ambulatory Visit (INDEPENDENT_AMBULATORY_CARE_PROVIDER_SITE_OTHER): Payer: Medicare Other

## 2022-06-15 DIAGNOSIS — E291 Testicular hypofunction: Secondary | ICD-10-CM | POA: Diagnosis not present

## 2022-06-15 MED ORDER — TESTOSTERONE CYPIONATE 200 MG/ML IM SOLN
200.0000 mg | INTRAMUSCULAR | Status: AC
  Administered 2022-06-15: 200 mg via INTRAMUSCULAR

## 2022-06-15 NOTE — Progress Notes (Signed)
Jermaine James. is a 66 y.o. male presents to the office today for Testosterone injection per pDr Fry's order. Original order: Testosterone '200mg'$  IM weekly for 6 months Testosterone '200mg'$  IM was administered left ventrogluteal today. Patient tolerated injection. Patient due for follow up labs/provider appt: Yes. Date due: 12/18/21, appt made Yes Patient next injection due: 06/22/22, appt made Yes  [name]

## 2022-06-21 NOTE — Telephone Encounter (Signed)
Pt picked Rx from the office

## 2022-06-22 ENCOUNTER — Telehealth: Payer: Self-pay | Admitting: Gastroenterology

## 2022-06-22 ENCOUNTER — Ambulatory Visit (INDEPENDENT_AMBULATORY_CARE_PROVIDER_SITE_OTHER): Payer: Medicare Other | Admitting: *Deleted

## 2022-06-22 DIAGNOSIS — E291 Testicular hypofunction: Secondary | ICD-10-CM

## 2022-06-22 DIAGNOSIS — E349 Endocrine disorder, unspecified: Secondary | ICD-10-CM

## 2022-06-22 MED ORDER — TESTOSTERONE CYPIONATE 200 MG/ML IM SOLN
200.0000 mg | Freq: Once | INTRAMUSCULAR | Status: AC
  Administered 2022-06-22: 200 mg via INTRAMUSCULAR

## 2022-06-22 NOTE — Telephone Encounter (Signed)
Patient reports "pretty constant" abdominal cramping. He is having loose bowel movements 2-3 times a day and blood with each one. States unintentional weight loss of 4-5 lbs. No other symptoms such as nausea, fever, or rectal pain. He finds the blood alarming. Appointment is 07/14/22. Please advise.

## 2022-06-22 NOTE — Telephone Encounter (Signed)
Inbound call from patient stating he has had rectal bleeding for 4 days and while this is something he says he experienced in the past he is having a new symptom of abdominal cramping. I made him an appt with APP on 11/30 but he is requesting a call back to further advise.

## 2022-06-22 NOTE — Progress Notes (Signed)
Per orders of Dr. Fry, injection of Testosterone cypionate 200mg given by Rosi Secrist A. Patient tolerated injection well.  

## 2022-06-23 ENCOUNTER — Other Ambulatory Visit (INDEPENDENT_AMBULATORY_CARE_PROVIDER_SITE_OTHER): Payer: Medicare Other

## 2022-06-23 ENCOUNTER — Other Ambulatory Visit: Payer: Self-pay

## 2022-06-23 ENCOUNTER — Other Ambulatory Visit (HOSPITAL_COMMUNITY): Payer: Self-pay

## 2022-06-23 DIAGNOSIS — R1030 Lower abdominal pain, unspecified: Secondary | ICD-10-CM

## 2022-06-23 LAB — BASIC METABOLIC PANEL
BUN: 21 mg/dL (ref 6–23)
CO2: 25 mEq/L (ref 19–32)
Calcium: 9.8 mg/dL (ref 8.4–10.5)
Chloride: 102 mEq/L (ref 96–112)
Creatinine, Ser: 1.25 mg/dL (ref 0.40–1.50)
GFR: 59.33 mL/min — ABNORMAL LOW (ref >60.00)
Glucose, Bld: 111 mg/dL — ABNORMAL HIGH (ref 70–99)
Potassium: 3.5 mEq/L (ref 3.5–5.1)
Sodium: 137 mEq/L (ref 135–145)

## 2022-06-23 LAB — CBC WITH DIFFERENTIAL/PLATELET
Basophils Absolute: 0 10*3/uL (ref 0.0–0.1)
Basophils Relative: 0.2 % (ref 0.0–3.0)
Eosinophils Absolute: 0.5 10*3/uL (ref 0.0–0.7)
Eosinophils Relative: 5 % (ref 0.0–5.0)
HCT: 46.3 % (ref 39.0–52.0)
Hemoglobin: 15.3 g/dL (ref 13.0–17.0)
Lymphocytes Relative: 20.4 % (ref 12.0–46.0)
Lymphs Abs: 1.9 10*3/uL (ref 0.7–4.0)
MCHC: 33 g/dL (ref 30.0–36.0)
MCV: 88.5 fl (ref 78.0–100.0)
Monocytes Absolute: 0.7 10*3/uL (ref 0.1–1.0)
Monocytes Relative: 7.1 % (ref 3.0–12.0)
Neutro Abs: 6.4 10*3/uL (ref 1.4–7.7)
Neutrophils Relative %: 67.3 % (ref 43.0–77.0)
Platelets: 334 10*3/uL (ref 150.0–400.0)
RBC: 5.23 Mil/uL (ref 4.22–5.81)
RDW: 15.4 % (ref 11.5–15.5)
WBC: 9.5 10*3/uL (ref 4.0–10.5)

## 2022-06-23 MED ORDER — AMOXICILLIN-POT CLAVULANATE 875-125 MG PO TABS
1.0000 | ORAL_TABLET | Freq: Two times a day (BID) | ORAL | 0 refills | Status: DC
  Filled 2022-06-23: qty 20, 10d supply, fill #0

## 2022-06-23 NOTE — Telephone Encounter (Signed)
Patient called to follow up on advise and States he think the issue is diverticulitis. States bleeding has stopped. States he still cramping. Requesting a call back. Please call to advise.

## 2022-06-23 NOTE — Telephone Encounter (Signed)
Please advise patient to stay on liquid diet, check CBC, BMP and will order CT scan.  Please send prescription for Augmentin 875 mg twice daily for 10 days and office follow-up visit with me or APP soon.  If his symptoms worsen he will need to come to ER for evaluation

## 2022-06-23 NOTE — Telephone Encounter (Signed)
Patient is instructed. Pharmacy confirmed. Scheduled for APP appointment 07/14/22. Monitoring schedule for something sooner.

## 2022-06-27 ENCOUNTER — Ambulatory Visit (HOSPITAL_BASED_OUTPATIENT_CLINIC_OR_DEPARTMENT_OTHER)
Admission: RE | Admit: 2022-06-27 | Discharge: 2022-06-27 | Disposition: A | Discharge status: Home / Self Care | Payer: Medicare Other | Source: Ambulatory Visit | Attending: Gastroenterology | Admitting: Gastroenterology

## 2022-06-27 DIAGNOSIS — R1030 Lower abdominal pain, unspecified: Secondary | ICD-10-CM | POA: Insufficient documentation

## 2022-06-27 MED ORDER — IOHEXOL 300 MG/ML  SOLN
100.0000 mL | Freq: Once | INTRAMUSCULAR | Status: AC | PRN
  Administered 2022-06-27: 100 mL via INTRAVENOUS

## 2022-06-29 ENCOUNTER — Ambulatory Visit: Payer: Medicare Other

## 2022-07-06 ENCOUNTER — Ambulatory Visit (INDEPENDENT_AMBULATORY_CARE_PROVIDER_SITE_OTHER): Payer: Medicare Other

## 2022-07-06 DIAGNOSIS — E291 Testicular hypofunction: Secondary | ICD-10-CM | POA: Diagnosis not present

## 2022-07-06 DIAGNOSIS — E349 Endocrine disorder, unspecified: Secondary | ICD-10-CM

## 2022-07-06 MED ORDER — TESTOSTERONE CYPIONATE 200 MG/ML IM SOLN
200.0000 mg | INTRAMUSCULAR | Status: AC
  Administered 2022-07-06 – 2022-09-07 (×6): 200 mg via INTRAMUSCULAR

## 2022-07-06 NOTE — Progress Notes (Signed)
Per orders of Dr. Fry, injection of Testosterone 200 mg/mL given by Berlie Persky N Joseluis Alessio. Patient tolerated injection well.  

## 2022-07-13 ENCOUNTER — Ambulatory Visit (INDEPENDENT_AMBULATORY_CARE_PROVIDER_SITE_OTHER): Payer: Medicare Other

## 2022-07-13 DIAGNOSIS — R7989 Other specified abnormal findings of blood chemistry: Secondary | ICD-10-CM | POA: Diagnosis not present

## 2022-07-13 DIAGNOSIS — E291 Testicular hypofunction: Secondary | ICD-10-CM | POA: Diagnosis not present

## 2022-07-13 NOTE — Progress Notes (Signed)
[name]. is a 66 y.o. male presents to the office today for Testosterone injections per Dr Barbie Banner orders. Original order: testosterone cypionate (DEPOTESTOSTERONE CYPIONATE) injection 200 mg   [[id number]]  Ordered Dose: 200 mg Route: Intramuscular Frequency: Weekly   Testosterone '200mg'$  IM was administered left ventrogluteal today. Patient tolerated injection. Patient due for follow up labs/provider appt: No.  Patient next injection due: 07/20/22, appt made Yes  [name]

## 2022-07-14 ENCOUNTER — Ambulatory Visit (INDEPENDENT_AMBULATORY_CARE_PROVIDER_SITE_OTHER): Payer: Medicare Other | Admitting: Physician Assistant

## 2022-07-14 ENCOUNTER — Encounter: Payer: Self-pay | Admitting: Physician Assistant

## 2022-07-14 ENCOUNTER — Other Ambulatory Visit (HOSPITAL_COMMUNITY): Payer: Self-pay

## 2022-07-14 VITALS — BP 140/70 | HR 90 | Ht 71.0 in | Wt 294.4 lb

## 2022-07-14 DIAGNOSIS — Z8719 Personal history of other diseases of the digestive system: Secondary | ICD-10-CM

## 2022-07-14 DIAGNOSIS — Z860100 Personal history of colon polyps, unspecified: Secondary | ICD-10-CM

## 2022-07-14 DIAGNOSIS — K625 Hemorrhage of anus and rectum: Secondary | ICD-10-CM | POA: Diagnosis not present

## 2022-07-14 DIAGNOSIS — K648 Other hemorrhoids: Secondary | ICD-10-CM | POA: Diagnosis not present

## 2022-07-14 MED ORDER — HYDROCORTISONE ACETATE 25 MG RE SUPP
25.0000 mg | Freq: Two times a day (BID) | RECTAL | 1 refills | Status: DC
  Filled 2022-07-14: qty 12, 6d supply, fill #0

## 2022-07-14 MED ORDER — NA SULFATE-K SULFATE-MG SULF 17.5-3.13-1.6 GM/177ML PO SOLN
1.0000 | ORAL | 0 refills | Status: DC
  Filled 2022-07-14: qty 354, 1d supply, fill #0

## 2022-07-14 NOTE — Progress Notes (Signed)
Subjective:    Patient ID: Jermaine James., male    DOB: 04/21/1956, 66 y.o.   MRN: [id number]  HPI Jermaine James" is a 66 year old white male, established with Dr. Silverio James who comes in today with complaints of rectal bleeding.  He had called earlier in the month with rectal bleeding and abdominal cramping and was worried about diverticulitis. He did have CT of the abdomen and pelvis done on 06/27/2022 which showed scattered diverticulosis, primarily in the distal descending and proximal sigmoid colon but no evidence of diverticulitis and otherwise unremarkable. He had colonoscopy last in September 2019 with finding of 2 sessile polyps one 6 mm, one 4 mm both were sessile serrated polyps, also noted diverticulosis from the ascending to the sigmoid colon and is indicated for 5-year interval follow-up. He has history of morbid obesity, adult onset diabetes mellitus, sleep apnea with CPAP use no oxygen, hypertension Patient says that he had an episode in 2020 while he was out of town where he had abrupt onset of a large amount of rectal bleeding, with dark red blood in the commode which she describes as looking like a "murder scene".  He had a couple of episodes of this, called 911 and was seen in emergency room in [place], he was not admitted, labs were apparently stable and the bleeding stopped after he went to the emergency room.  Several months later he had another episode similar but did not seek medical attention because again it stopped.  Intermittently since that time he has continued to have episodes of rectal bleeding he says previously they would happen 6 to 8 months apart and now over the past few months these have been happening more and more frequently somewhere in the range of 6 to 8 weeks apart.  He says the bleeding is not as heavy as it had been in past years but for instance has occurred 2 times this week.  Now he says he will have an urge for a bowel movement, stool will be soft to  loose and then with second urge he will pass the dark blood at this point mostly noticing it on the tissue and less so in the commode.  He is not having any abdominal pain or cramping, he had an episode a couple of weeks ago that lasted for 4 days in a row only occurring with a bowel movement generally once per day.  He has no complaints of rectal pain but has felt perhaps a protrusion from his rectum with these episodes.  Labs were done on 06/23/2022 hemoglobin 15.3  Review of Systems Pertinent positive and negative review of systems were noted in the above HPI section.  All other review of systems was otherwise negative.   Outpatient Encounter Medications as of 07/14/2022  Medication Sig   amoxicillin-clavulanate (AUGMENTIN) 875-125 MG tablet Take 1 tablet by mouth 2 (two) times daily.   fenofibrate 160 MG tablet Take 1 tablet (160 mg total) by mouth daily.   hydrocortisone (ANUSOL-HC) 25 MG suppository Place 1 suppository (25 mg total) rectally every 12 (twelve) hours.   lisinopril-hydrochlorothiazide (ZESTORETIC) 20-25 MG tablet Take 1 tablet by mouth daily.   metFORMIN (GLUCOPHAGE) 1000 MG tablet Take 1 tablet (1,000 mg total) by mouth 2 (two) times daily with a meal.   Na Sulfate-K Sulfate-Mg Sulf (SUPREP BOWEL PREP KIT) 17.5-3.13-1.6 GM/177ML SOLN Take 1 kit by mouth as directed. For colonoscopy prep   tadalafil (CIALIS) 20 MG tablet Take 1 tablet (20 mg total)  by mouth as needed for erectile dysfunction.   testosterone cypionate (DEPOTESTOSTERONE CYPIONATE) 200 MG/ML injection Inject 1 mL (200 mg total) into the muscle every 7 (seven) days.   Facility-Administered Encounter Medications as of 07/14/2022  Medication   testosterone cypionate (DEPOTESTOSTERONE CYPIONATE) injection 200 mg   No Known Allergies Patient Active Problem List   Diagnosis Date Noted   Erectile dysfunction 12/07/2021   Ankle edema 05/27/2020   History of lumbar fusion 02/18/2019   Lumbar stenosis with neurogenic  claudication 02/07/2019   Personal history of spine surgery 02/07/2019   Degeneration of lumbar intervertebral disc 01/09/2019   Backache 01/08/2019   Rectal bleed 05/13/2018   Primary localized osteoarthritis of right knee 09/14/2016   S/P total knee arthroplasty 05/18/2016   OSA (obstructive sleep apnea) 07/03/2012   Cellulitis 07/01/2012   MICROSCOPIC HEMATURIA 08/04/2009   ACHILLES TENDINITIS 07/02/2008   GOUT 10/02/2007   PSA, INCREASED 08/13/2007   Type 2 diabetes mellitus ([place]) 04/20/2007   Dyslipidemia 04/20/2007   Essential hypertension 03/26/2007   ALLERGIC RHINITIS 03/26/2007   Social History   Socioeconomic History   Marital status: Married    Spouse name: Not on file   Number of children: Not on file   Years of education: Not on file   Highest education level: Bachelor's degree (e.g., BA, AB, BS)  Occupational History   Occupation: Customer service manager    Comment: retired  Tobacco Use   Smoking status: Never   Smokeless tobacco: Never  Vaping Use   Vaping Use: Never used  Substance and Sexual Activity   Alcohol use: Yes    Alcohol/week: 3.0 - 4.0 standard drinks of alcohol    Types: 1 Glasses of wine, 1 Cans of beer, 1 - 2 Standard drinks or equivalent per week   Drug use: No   Sexual activity: Yes  Other Topics Concern   Not on file  Social History Narrative   Not on file   Social Determinants of Health   Financial Resource Strain: Low Risk  (12/07/2021)   Overall Financial Resource Strain (CARDIA)    Difficulty of Paying Living Expenses: Not hard at all  Food Insecurity: No Food Insecurity (12/07/2021)   Hunger Vital Sign    Worried About Running Out of Food in the Last Year: Never true    Ran Out of Food in the Last Year: Never true  Transportation Needs: No Transportation Needs (12/07/2021)   PRAPARE - Hydrologist (Medical): No    Lack of Transportation (Non-Medical): No  Physical Activity: Sufficiently Active (12/07/2021)    Exercise Vital Sign    Days of Exercise per Week: 3 days    Minutes of Exercise per Session: 50 min  Stress: No Stress Concern Present (12/07/2021)   [place]    Feeling of Stress : Not at all  Social Connections: [place] (12/07/2021)   Social Connection and Isolation Panel [NHANES]    Frequency of Communication with Friends and Family: More than three times a week    Frequency of Social Gatherings with Friends and Family: Once a week    Attends Religious Services: More than 4 times per year    Active Member of [employer] or Organizations: Yes    Attends Archivist Meetings: 1 to 4 times per year    Marital Status: Married  Human resources officer Violence: Not At Risk (11/10/2020)   Humiliation, Afraid, Rape, and Kick questionnaire  Fear of Current or Ex-Partner: No    Emotionally Abused: No    Physically Abused: No    Sexually Abused: No    Mr. Coppedge family history includes Anesthesia problems in his father.      Objective:    Vitals:   07/14/22 1046  BP: (!) 140/70  Pulse: 90    Physical Exam. Well-developed well-nourished white male in no acute distress.  Height, Weight, 294 BMI 41.0  HEENT; nontraumatic normocephalic, EOMI, PE R LA, sclera anicteric. Oropharynx; not examined today Neck; supple, no JVD Cardiovascular; regular rate and rhythm with S1-S2, no murmur rub or gallop Pulmonary; Clear bilaterally Abdomen; soft, obese, nontender, nondistended, no palpable mass or hepatosplenomegaly, bowel sounds are active Rectal; not done today-plan colonoscopy Skin; benign exam, no jaundice rash or appreciable lesions Extremities; no clubbing cyanosis or edema skin warm and dry Neuro/Psych; alert and oriented x4, grossly nonfocal mood and affect appropriate        Assessment & Plan:   #18 66 year old white male with episodes of intermittent rectal bleeding occurring over the past few months  and sometimes lasting a few days at a time generally bleeding just with bowel movements and noting blood primarily on the tissue occasionally small amount in the commode.  These recent episodes are most likely secondary to internal hemorrhoidal bleeding, however need to rule out other colonic pathology, neoplasm, scad, or other mucosal lesion.  #2 2 episodes of heavy rectal bleeding occurring in 2021 which took him to the emergency room while he was out of town, etiology not clear, he does have scattered diverticulosis and this may have been a self-limited diverticular bleed.  #3 history of sessile serrated colon polyps-last colonoscopy 2019 and had been indicated for 5-year interval follow-up  #4 hypertension #5.  Sleep apnea-no oxygen use #6.  Adult onset diabetes mellitus #7 obesity-BMI 63  Plan; start Anusol HC suppositories 1 per rectum nightly x7 days then repeat course as needed for recurrent rectal bleeding. Patient will be scheduled for colonoscopy with Dr. Silverio James  for further evaluation.  Procedure was discussed in detail with the patient including indications risk and benefits and he is agreeable to proceed If colonoscopy is unremarkable for source of bleeding other than internal hemorrhoids, then will benefit from an office hemorrhoidal banding which was also discussed with the patient today.  Jermaine James Genia Harold PA-C 07/14/2022   Cc: Laurey Morale, MD

## 2022-07-14 NOTE — Patient Instructions (Signed)
We have sent the following medications to your pharmacy for you to pick up at your convenience: Suprep, Anusol   Use Anusol suppositories nightly for 7-10 days as needed for rectal bleeding.   You have been scheduled for a colonoscopy. Please follow written instructions given to you at your visit today.  Please pick up your prep supplies at the pharmacy within the next 1-3 days. If you use inhalers (even only as needed), please bring them with you on the day of your procedure.  Due to recent changes in healthcare laws, you may see the results of your imaging and laboratory studies on MyChart before your provider has had a chance to review them.  We understand that in some cases there may be results that are confusing or concerning to you. Not all laboratory results come back in the same time frame and the provider may be waiting for multiple results in order to interpret others.  Please give [place] 48 hours in order for your provider to thoroughly review all the results before contacting the office for clarification of your results.   Thank you for choosing me and [facility].  Amy Esterwood PA

## 2022-07-15 ENCOUNTER — Other Ambulatory Visit (HOSPITAL_COMMUNITY): Payer: Self-pay

## 2022-07-18 ENCOUNTER — Other Ambulatory Visit (HOSPITAL_COMMUNITY): Payer: Self-pay

## 2022-07-20 ENCOUNTER — Telehealth: Payer: Self-pay | Admitting: Family Medicine

## 2022-07-20 ENCOUNTER — Ambulatory Visit (INDEPENDENT_AMBULATORY_CARE_PROVIDER_SITE_OTHER): Payer: Medicare Other

## 2022-07-20 DIAGNOSIS — R7989 Other specified abnormal findings of blood chemistry: Secondary | ICD-10-CM | POA: Diagnosis not present

## 2022-07-20 DIAGNOSIS — E291 Testicular hypofunction: Secondary | ICD-10-CM

## 2022-07-20 MED ORDER — TESTOSTERONE CYPIONATE 200 MG/ML IM SOLN
200.0000 mg | INTRAMUSCULAR | Status: AC
  Administered 2022-07-20 – 2022-08-31 (×3): 200 mg via INTRAMUSCULAR

## 2022-07-20 NOTE — Progress Notes (Signed)
Testosterone '200mg'$  IM was administered right ventrogluteal today. Patient tolerated injection. Patient due for follow up labs/provider appt: No.  Patient next injection due: 07/27/22, appt made Yes   Jermaine James

## 2022-07-20 NOTE — Telephone Encounter (Signed)
Patient wants to get a SHingles shot at his 07/27/22 visit--need orders.

## 2022-07-20 NOTE — Telephone Encounter (Signed)
Please give the shingles vaccine

## 2022-07-21 NOTE — Telephone Encounter (Signed)
Pt has appointment scheduled for 07/27/22 for shingles vaccine, ok per Dr Sarajane Jews

## 2022-07-27 ENCOUNTER — Ambulatory Visit (INDEPENDENT_AMBULATORY_CARE_PROVIDER_SITE_OTHER): Payer: Medicare Other | Admitting: *Deleted

## 2022-07-27 DIAGNOSIS — E349 Endocrine disorder, unspecified: Secondary | ICD-10-CM | POA: Diagnosis not present

## 2022-07-27 MED ORDER — TESTOSTERONE CYPIONATE 200 MG/ML IM SOLN
200.0000 mg | Freq: Once | INTRAMUSCULAR | Status: AC
  Administered 2022-07-27: 200 mg via INTRAMUSCULAR

## 2022-07-27 NOTE — Telephone Encounter (Signed)
Shingrix vaccine was not given during nurse's visit for the Testosterone injection due to the patient's insurance.  I apologized for the patient not being informed of this prior to the visit and recommended he contact a local pharmacy for the vaccine.

## 2022-07-27 NOTE — Progress Notes (Signed)
Per orders of Dr. Fry, injection of Testosterone cypionate 200mg given by Tanaya Dunigan A. Patient tolerated injection well.  

## 2022-08-03 ENCOUNTER — Encounter: Payer: Self-pay | Admitting: Gastroenterology

## 2022-08-03 ENCOUNTER — Ambulatory Visit (INDEPENDENT_AMBULATORY_CARE_PROVIDER_SITE_OTHER): Payer: Medicare Other

## 2022-08-03 DIAGNOSIS — E291 Testicular hypofunction: Secondary | ICD-10-CM

## 2022-08-03 DIAGNOSIS — R7989 Other specified abnormal findings of blood chemistry: Secondary | ICD-10-CM | POA: Diagnosis not present

## 2022-08-03 NOTE — Progress Notes (Signed)
Per orders of Dr. Fry, injection of Testosterone 200 mg/mL given by Naketa Daddario N Rosanne Wohlfarth. Patient tolerated injection well.  

## 2022-08-04 ENCOUNTER — Other Ambulatory Visit: Payer: Self-pay

## 2022-08-04 ENCOUNTER — Other Ambulatory Visit (INDEPENDENT_AMBULATORY_CARE_PROVIDER_SITE_OTHER): Payer: Medicare Other

## 2022-08-04 DIAGNOSIS — K625 Hemorrhage of anus and rectum: Secondary | ICD-10-CM | POA: Diagnosis not present

## 2022-08-04 LAB — CBC WITH DIFFERENTIAL/PLATELET
Basophils Absolute: 0.1 10*3/uL (ref 0.0–0.1)
Basophils Relative: 1.3 % (ref 0.0–3.0)
Eosinophils Absolute: 0.3 10*3/uL (ref 0.0–0.7)
Eosinophils Relative: 5.3 % — ABNORMAL HIGH (ref 0.0–5.0)
HCT: 47.7 % (ref 39.0–52.0)
Hemoglobin: 16.1 g/dL (ref 13.0–17.0)
Lymphocytes Relative: 18 % (ref 12.0–46.0)
Lymphs Abs: 1.2 10*3/uL (ref 0.7–4.0)
MCHC: 33.7 g/dL (ref 30.0–36.0)
MCV: 91.5 fl (ref 78.0–100.0)
Monocytes Absolute: 0.6 10*3/uL (ref 0.1–1.0)
Monocytes Relative: 9.6 % (ref 3.0–12.0)
Neutro Abs: 4.3 10*3/uL (ref 1.4–7.7)
Neutrophils Relative %: 65.8 % (ref 43.0–77.0)
Platelets: 300 10*3/uL (ref 150.0–400.0)
RBC: 5.22 Mil/uL (ref 4.22–5.81)
RDW: 15.1 % (ref 11.5–15.5)
WBC: 6.6 10*3/uL (ref 4.0–10.5)

## 2022-08-10 ENCOUNTER — Other Ambulatory Visit (HOSPITAL_COMMUNITY): Payer: Self-pay

## 2022-08-10 ENCOUNTER — Other Ambulatory Visit: Payer: Self-pay

## 2022-08-10 ENCOUNTER — Ambulatory Visit (INDEPENDENT_AMBULATORY_CARE_PROVIDER_SITE_OTHER): Payer: Medicare Other

## 2022-08-10 DIAGNOSIS — E291 Testicular hypofunction: Secondary | ICD-10-CM

## 2022-08-10 DIAGNOSIS — E349 Endocrine disorder, unspecified: Secondary | ICD-10-CM

## 2022-08-10 NOTE — Patient Instructions (Signed)
Health Maintenance Due  Topic Date Due   Hepatitis C Screening  Never done   Zoster Vaccines- Shingrix (1 of 2) Never done   DTaP/Tdap/Td (2 - Tdap) 10/29/2019   OPHTHALMOLOGY EXAM  09/18/2021   Diabetic kidney evaluation - Urine ACR  09/29/2021   INFLUENZA VACCINE  03/15/2022   COVID-19 Vaccine (4 - 2023-24 season) 04/15/2022      Row Labels 12/07/2021   11:02 AM 12/07/2021    9:54 AM 11/10/2020    3:26 PM  Depression screen PHQ 2/9   Section Header. No data exists in this row.     Decreased Interest   0 0 0  Down, Depressed, Hopeless   0 0 0  PHQ - 2 Score   0 0 0  Altered sleeping   0    Tired, decreased energy   0    Change in appetite   0    Feeling bad or failure about yourself    0    Trouble concentrating   0    Moving slowly or fidgety/restless   0    Suicidal thoughts   0    PHQ-9 Score   0    Difficult doing work/chores   Not difficult at all

## 2022-08-10 NOTE — Progress Notes (Signed)
Per orders of Dr. Sarajane Jews, injection of testosterone given by Franco Collet. Patient tolerated injection well.

## 2022-08-17 ENCOUNTER — Ambulatory Visit (INDEPENDENT_AMBULATORY_CARE_PROVIDER_SITE_OTHER): Payer: Medicare Other

## 2022-08-17 DIAGNOSIS — R7989 Other specified abnormal findings of blood chemistry: Secondary | ICD-10-CM

## 2022-08-17 DIAGNOSIS — E291 Testicular hypofunction: Secondary | ICD-10-CM

## 2022-08-17 NOTE — Progress Notes (Signed)
[name]. is a 67 y.o. male presents to the office today for Testosterone injections per Dr Barbie Banner orders. Original order: testosterone cypionate (DEPOTESTOSTERONE CYPIONATE) injection 200 mg   [[id number]]   Ordered Dose: 200 mg Route: Intramuscular Frequency: Weekly    Testosterone '200mg'$  IM was administered left ventrogluteal today. Patient tolerated injection. This medication was provided by the patient. Patient due for follow up labs/provider appt: No.  Patient next injection due: 08/24/22 appt made Yes

## 2022-08-18 ENCOUNTER — Encounter (HOSPITAL_COMMUNITY): Payer: Self-pay | Admitting: Gastroenterology

## 2022-08-18 NOTE — Progress Notes (Signed)
Attempted to obtain medical history via telephone, unable to reach at this time. HIPAA compliant voicemail message left requesting return call to pre surgical testing department. 

## 2022-08-22 ENCOUNTER — Other Ambulatory Visit (HOSPITAL_COMMUNITY): Payer: Self-pay

## 2022-08-24 ENCOUNTER — Ambulatory Visit (INDEPENDENT_AMBULATORY_CARE_PROVIDER_SITE_OTHER): Payer: Medicare Other | Admitting: *Deleted

## 2022-08-24 ENCOUNTER — Encounter: Payer: Medicare Other | Admitting: Gastroenterology

## 2022-08-24 DIAGNOSIS — E349 Endocrine disorder, unspecified: Secondary | ICD-10-CM | POA: Diagnosis not present

## 2022-08-24 DIAGNOSIS — E291 Testicular hypofunction: Secondary | ICD-10-CM | POA: Diagnosis not present

## 2022-08-24 NOTE — Progress Notes (Signed)
Per orders of Dr. Fry, injection of Testosterone cypionate 200mg given by Decklyn Hornik A. Patient tolerated injection well.  

## 2022-08-25 ENCOUNTER — Encounter (HOSPITAL_COMMUNITY)
Admission: RE | Disposition: A | Discharge status: Home / Self Care | Payer: Self-pay | Source: Ambulatory Visit | Attending: Gastroenterology

## 2022-08-25 ENCOUNTER — Encounter (HOSPITAL_COMMUNITY): Payer: Self-pay | Admitting: Gastroenterology

## 2022-08-25 ENCOUNTER — Other Ambulatory Visit: Payer: Self-pay

## 2022-08-25 ENCOUNTER — Ambulatory Visit (HOSPITAL_BASED_OUTPATIENT_CLINIC_OR_DEPARTMENT_OTHER): Payer: Medicare Other | Admitting: Anesthesiology

## 2022-08-25 ENCOUNTER — Ambulatory Visit (HOSPITAL_COMMUNITY): Payer: Medicare Other | Admitting: Anesthesiology

## 2022-08-25 ENCOUNTER — Ambulatory Visit (HOSPITAL_COMMUNITY)
Admission: RE | Admit: 2022-08-25 | Discharge: 2022-08-25 | Disposition: A | Discharge status: Home / Self Care | Payer: Medicare Other | Source: Ambulatory Visit | Attending: Gastroenterology | Admitting: Gastroenterology

## 2022-08-25 DIAGNOSIS — K5731 Diverticulosis of large intestine without perforation or abscess with bleeding: Secondary | ICD-10-CM

## 2022-08-25 DIAGNOSIS — E1122 Type 2 diabetes mellitus with diabetic chronic kidney disease: Secondary | ICD-10-CM | POA: Insufficient documentation

## 2022-08-25 DIAGNOSIS — K625 Hemorrhage of anus and rectum: Secondary | ICD-10-CM | POA: Diagnosis not present

## 2022-08-25 DIAGNOSIS — Z6841 Body Mass Index (BMI) 40.0 and over, adult: Secondary | ICD-10-CM | POA: Diagnosis not present

## 2022-08-25 DIAGNOSIS — Z7984 Long term (current) use of oral hypoglycemic drugs: Secondary | ICD-10-CM | POA: Diagnosis not present

## 2022-08-25 DIAGNOSIS — I129 Hypertensive chronic kidney disease with stage 1 through stage 4 chronic kidney disease, or unspecified chronic kidney disease: Secondary | ICD-10-CM | POA: Diagnosis not present

## 2022-08-25 DIAGNOSIS — K921 Melena: Secondary | ICD-10-CM | POA: Insufficient documentation

## 2022-08-25 DIAGNOSIS — G4733 Obstructive sleep apnea (adult) (pediatric): Secondary | ICD-10-CM | POA: Insufficient documentation

## 2022-08-25 DIAGNOSIS — K5730 Diverticulosis of large intestine without perforation or abscess without bleeding: Secondary | ICD-10-CM | POA: Diagnosis not present

## 2022-08-25 DIAGNOSIS — N189 Chronic kidney disease, unspecified: Secondary | ICD-10-CM

## 2022-08-25 DIAGNOSIS — K648 Other hemorrhoids: Secondary | ICD-10-CM | POA: Diagnosis not present

## 2022-08-25 HISTORY — PX: COLONOSCOPY WITH PROPOFOL: SHX5780

## 2022-08-25 SURGERY — COLONOSCOPY WITH PROPOFOL
Anesthesia: Monitor Anesthesia Care

## 2022-08-25 MED ORDER — LACTATED RINGERS IV SOLN: INTRAVENOUS | Status: DC

## 2022-08-25 MED ORDER — PROPOFOL 500 MG/50ML IV EMUL
INTRAVENOUS | Status: DC | PRN
  Administered 2022-08-25: 150 ug/kg/min via INTRAVENOUS

## 2022-08-25 MED ORDER — PROPOFOL 10 MG/ML IV BOLUS
INTRAVENOUS | Status: DC | PRN
  Administered 2022-08-25 (×2): 50 mg via INTRAVENOUS
  Administered 2022-08-25: 40 mg via INTRAVENOUS

## 2022-08-25 MED ORDER — SODIUM CHLORIDE 0.9 % IV SOLN: INTRAVENOUS | Status: DC

## 2022-08-25 MED ORDER — LIDOCAINE HCL (CARDIAC) PF 100 MG/5ML IV SOSY
PREFILLED_SYRINGE | INTRAVENOUS | Status: DC | PRN
  Administered 2022-08-25 (×2): 50 mg via INTRAVENOUS

## 2022-08-25 SURGICAL SUPPLY — 22 items

## 2022-08-25 NOTE — H&P (Signed)
[facility] History and Physical   Primary Care Physician:  Laurey Morale, MD   Reason for Procedure:   Rectal bleeding  Plan:    Colonoscopy with possible intervention     HPI: Jermaine James. is a 67 y.o. male here for colonoscopy for evalaution and management of rectal bleeding.  Please refer to office visit note 07/14/22 by Nicoletta Ba for additional details  The risks and benefits as well as alternatives of endoscopic procedure(s) have been discussed and reviewed. All questions answered. The patient agrees to proceed.    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 of post-op N/V   High triglycerides    no current med.   History of gout    History of kidney stones    Hypertension    states under control with med., has been on med. ~ 48 yr.   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 Hilarie Fredrickson, adenomatous polyps, repeat in 5 yrs   CYSTOSCOPY  2011   "related to trace blood in urine"   EYE SURGERY Left 1961   traumatic loss of eye   INGUINAL HERNIA REPAIR Left    KNEE ARTHROSCOPY Left 1990s?   KNEE ARTHROSCOPY WITH MEDIAL MENISECTOMY Right 11/08/2012   Procedure: RIGHT KNEE ARTHROSCOPY WITH PARTIAL MEDIAL AND LATERAL MENISECTOMIES, CHONDROPLASTY;  Surgeon: [name], MD;  Location: [place];  Service: Orthopedics;  Laterality: Right;  RIGHT KNEE SCOPE WITH PARTIAL MEDIAL AND  LATERAL  MENISCECTOMIES  AND CHONDROPLASTY   KNEE CLOSED REDUCTION Left 08/18/2016   Procedure: LEFT  MANIPULATION KNEE;  Surgeon: [name], MD;  Location: [place];  Service: Orthopedics;  Laterality: Left;   LIPOMA EXCISION Left    upper back   MOHS SURGERY Left 10/2015   "forehead"   RETINAL TEAR REPAIR CRYOTHERAPY Right 11/2017   SPINE SURGERY  02/07/2019   TOTAL KNEE ARTHROPLASTY Left 05/18/2016   Procedure: LEFT TOTAL KNEE ARTHROPLASTY;  Surgeon: [name], MD;  Location: [place];  Service: Orthopedics;  Laterality: Left;   TOTAL KNEE ARTHROPLASTY Right 09/14/2016   Procedure: TOTAL KNEE ARTHROPLASTY MANIPULATION LEFT KNEE;  Surgeon: [name], MD;  Location: [place];  Service: Orthopedics;  Laterality: Right;    Prior to Admission medications   Medication Sig Start Date End Date Taking? Authorizing Provider  fenofibrate 160 MG tablet Take 1 tablet (160 mg total) by mouth daily. 12/07/21  Yes Laurey Morale, MD  lisinopril-hydrochlorothiazide (ZESTORETIC) 20-25 MG tablet Take 1 tablet by mouth daily. 12/09/21  Yes Laurey Morale, MD  metFORMIN (GLUCOPHAGE) 1000 MG tablet Take 1 tablet (1,000 mg total) by mouth 2 (two) times daily with a meal. 11/26/21  Yes Philemon Kingdom, MD  Na Sulfate-K Sulfate-Mg Sulf (  SUPREP BOWEL PREP KIT) 17.5-3.13-1.6 GM/177ML SOLN Take 1 kit by mouth as directed. For colonoscopy prep 07/14/22  Yes Esterwood, Amy S, PA-C  tadalafil (CIALIS) 20 MG tablet Take 1 tablet (20 mg total) by mouth as needed for erectile dysfunction. 06/10/22   Laurey Morale, MD  testosterone cypionate (DEPOTESTOSTERONE CYPIONATE) 200 MG/ML injection Inject 1 mL (200 mg total) into the muscle every 7 (seven) days. 06/10/22   Laurey Morale, MD    Current Facility-Administered Medications  Medication Dose Route Frequency Provider Last Rate Last Admin   0.9 %  sodium chloride infusion   Intravenous Continuous Satara Virella, Venia Minks, MD        Allergies as  of 08/04/2022   (No Known Allergies)    Family History  Problem Relation Age of Onset   Anesthesia problems Father        post-op N/V   Colon cancer Neg Hx    Esophageal cancer Neg Hx    Rectal cancer Neg Hx    Stomach cancer Neg Hx     Social History   Socioeconomic History   Marital status: Married    Spouse name: Not on file   Number of children: Not on file   Years of education: Not on file   Highest education level: Bachelor's degree (e.g., BA, AB, BS)  Occupational History   Occupation: Customer service manager    Comment: retired  Tobacco Use   Smoking status: Never   Smokeless tobacco: Never  Vaping Use   Vaping Use: Never used  Substance and Sexual Activity   Alcohol use: Yes    Alcohol/week: 3.0 - 4.0 standard drinks of alcohol    Types: 1 Glasses of wine, 1 Cans of beer, 1 - 2 Standard drinks or equivalent per week   Drug use: No   Sexual activity: Yes  Other Topics Concern   Not on file  Social History Narrative   Not on file   Social Determinants of Health   Financial Resource Strain: Low Risk  (12/07/2021)   Overall Financial Resource Strain (CARDIA)    Difficulty of Paying Living Expenses: Not hard at all  Food Insecurity: No Food Insecurity (12/07/2021)   Hunger Vital Sign    Worried About Running Out of Food in the Last Year: Never true    Ran Out of Food in the Last Year: Never true  Transportation Needs: No Transportation Needs (12/07/2021)   PRAPARE - Hydrologist (Medical): No    Lack of Transportation (Non-Medical): No  Physical Activity: Sufficiently Active (12/07/2021)   Exercise Vital Sign    Days of Exercise per Week: 3 days    Minutes of Exercise per Session: 50 min  Stress: No Stress Concern Present (12/07/2021)   [place]    Feeling of Stress : Not at all  Social Connections: [place] (12/07/2021)   Social Connection and Isolation Panel [NHANES]     Frequency of Communication with Friends and Family: More than three times a week    Frequency of Social Gatherings with Friends and Family: Once a week    Attends Religious Services: More than 4 times per year    Active Member of [employer] or Organizations: Yes    Attends Archivist Meetings: 1 to 4 times per year    Marital Status: Married  Human resources officer Violence: Not At Risk (11/10/2020)   Humiliation, Afraid, Rape, and Kick questionnaire  Fear of Current or Ex-Partner: No    Emotionally Abused: No    Physically Abused: No    Sexually Abused: No    Review of Systems:  All other review of systems negative except as mentioned in the HPI.  Physical Exam: Vital signs in last 24 hours: Temp:  [98.2 F (36.8 C)] 98.2 F (36.8 C) (01/11 0955) Pulse Rate:  [103] 103 (01/11 0955) Resp:  [15] 15 (01/11 0955) BP: (159)/(90) 159/90 (01/11 0955) SpO2:  [96 %] 96 % (01/11 0955) Weight:  [131.5 kg] 131.5 kg (01/11 0955)   General:   Alert, NAD Lungs:  Clear .   Heart:  Regular rate and rhythm Abdomen:  Soft, nontender and nondistended. Neuro/Psych:  Alert and cooperative. Normal mood and affect. A and O x 3   K. Denzil Magnuson , MD [phone]

## 2022-08-25 NOTE — Discharge Instructions (Signed)

## 2022-08-25 NOTE — Op Note (Signed)
[facility] Patient Name: Jermaine James Procedure Date: 08/25/2022 MRN: [id number] Attending MD: Mauri Pole , MD, [id number] Date of Birth: [birth year] CSN: [id number] Age: 67 Admit Type: Outpatient Procedure:                Colonoscopy Indications:              Evaluation of unexplained GI bleeding presenting                            with Hematochezia Providers:                Mauri Pole, MD, Mikey College, RN, Vladimir Crofts, RN, [facility] Technician, Technician Referring MD:              Medicines:                Monitored Anesthesia Care Complications:            No immediate complications. Estimated Blood Loss:     Estimated blood loss was minimal. Procedure:                Pre-Anesthesia Assessment:                           - Prior to the procedure, a History and Physical                            was performed, and patient medications and                            allergies were reviewed. The patient's tolerance of                            previous anesthesia was also reviewed. The risks                            and benefits of the procedure and the sedation                            options and risks were discussed with the patient.                            All questions were answered, and informed consent                            was obtained. Prior Anticoagulants: The patient has                            taken no anticoagulant or antiplatelet agents. ASA                            Grade Assessment: III - A patient with severe  systemic disease. After reviewing the risks and                            benefits, the patient was deemed in satisfactory                            condition to undergo the procedure.                           After obtaining informed consent, the colonoscope                            was passed under direct vision. Throughout the                             procedure, the patient's blood pressure, pulse, and                            oxygen saturations were monitored continuously. The                            PCF-HQ190L ([id number]) Olympus colonoscope was                            introduced through the anus and advanced to the the                            cecum, identified by appendiceal orifice and                            ileocecal valve. The colonoscopy was performed                            without difficulty. The patient tolerated the                            procedure well. The quality of the bowel                            preparation was good. The ileocecal valve,                            appendiceal orifice, and rectum were photographed. Scope In: 11:10:35 AM Scope Out: 11:27:32 AM Scope Withdrawal Time: 0 hours 11 minutes 29 seconds  Total Procedure Duration: 0 hours 16 minutes 57 seconds  Findings:      The perianal and digital rectal examinations were normal.      Bleeding internal hemorrhoids were found during retroflexion and during       endoscopy. The hemorrhoids were medium-sized.      Multiple medium-mouthed and small-mouthed diverticula were found in the       sigmoid colon, descending colon, transverse colon and ascending colon.       There was no evidence of diverticular bleeding.      The exam was otherwise without abnormality. Impression:               -  Bleeding internal hemorrhoids.                           - Moderate diverticulosis in the sigmoid colon, in                            the descending colon, in the transverse colon and                            in the ascending colon. There was no evidence of                            diverticular bleeding.                           - The examination was otherwise normal.                           - No specimens collected. Moderate Sedation:      N/A Recommendation:           - Patient has a contact number available for                             emergencies. The signs and symptoms of potential                            delayed complications were discussed with the                            patient. Return to normal activities tomorrow.                            Written discharge instructions were provided to the                            patient.                           - Resume previous diet.                           - Continue present medications.                           - Repeat colonoscopy in 10 years for surveillance.                           - Benefiber 1 tablespoon three times daily with                            meals                           - Return to GI clinic tomorrow at 3:30 PM for  hemorrhoidal band ligation. Procedure Code(s):        --- Professional ---                           [id number], Colonoscopy, flexible; diagnostic, including                            collection of specimen(s) by brushing or washing,                            when performed (separate procedure) Diagnosis Code(s):        --- Professional ---                           K64.8, Other hemorrhoids                           K92.1, Melena (includes Hematochezia)                           K57.30, Diverticulosis of large intestine without                            perforation or abscess without bleeding CPT copyright 2022 American Medical Association. All rights reserved. The codes documented in this report are preliminary and upon coder review may  be revised to meet current compliance requirements. Mauri Pole, MD 08/25/2022 11:46:43 AM This report has been signed electronically. Number of Addenda: 0

## 2022-08-25 NOTE — Anesthesia Postprocedure Evaluation (Signed)
Anesthesia Post Note  Patient: Jermaine James.  Procedure(s) Performed: COLONOSCOPY WITH PROPOFOL     Patient location during evaluation: PACU Anesthesia Type: MAC Level of consciousness: awake Pain management: pain level controlled Vital Signs Assessment: post-procedure vital signs reviewed and stable Respiratory status: spontaneous breathing, nonlabored ventilation and respiratory function stable Cardiovascular status: stable and blood pressure returned to baseline Postop Assessment: no apparent nausea or vomiting Anesthetic complications: no   No notable events documented.  Last Vitals:  Vitals:   08/25/22 1140 08/25/22 1150  BP: 115/61 119/71  Pulse: 88 84  Resp: 14 13  Temp:    SpO2: 97% 96%    Last Pain:  Vitals:   08/25/22 1150  TempSrc:   PainSc: 0-No pain                 Nilda Simmer

## 2022-08-25 NOTE — Anesthesia Preprocedure Evaluation (Addendum)
Anesthesia Evaluation  Patient identified by MRN, date of birth, ID band Patient awake    Reviewed: Allergy & Precautions, NPO status , Patient's Chart, lab work & pertinent test results  History of Anesthesia Complications (+) Family history of anesthesia reaction and history of anesthetic complications (family h/o PONV)  Airway Mallampati: III  TM Distance: >3 FB Neck ROM: Full   Comment: Previous grade I view with Glidescope 4 Dental  (+) Teeth Intact, Dental Advisory Given   Pulmonary neg shortness of breath, sleep apnea and Continuous Positive Airway Pressure Ventilation , neg COPD, neg recent URI   Pulmonary exam normal breath sounds clear to auscultation       Cardiovascular hypertension (lisinopril-HCTZ), Pt. on medications (-) angina (-) Past MI, (-) Cardiac Stents and (-) CABG (-) dysrhythmias  Rhythm:Regular Rate:Normal  HLD   Neuro/Psych neg Seizures  Neuromuscular disease (lumbar stenosis s/p fusion)    GI/Hepatic Neg liver ROS, Bowel prep,neg GERD  ,,  Endo/Other  diabetes, Type 2, Oral Hypoglycemic Agents  Morbid obesity  Renal/GU CRFRenal disease     Musculoskeletal  (+) Arthritis , Osteoarthritis,    Abdominal  (+) + obese  Peds  Hematology negative hematology ROS (+)   Anesthesia Other Findings Blind left eye, gout  Reproductive/Obstetrics                             Anesthesia Physical Anesthesia Plan  ASA: 3  Anesthesia Plan: MAC   Post-op Pain Management:    Induction: Intravenous  PONV Risk Score and Plan: 1 and Propofol infusion  Airway Management Planned: Natural Airway and Nasal Cannula  Additional Equipment:   Intra-op Plan:   Post-operative Plan:   Informed Consent: I have reviewed the patients History and Physical, chart, labs and discussed the procedure including the risks, benefits and alternatives for the proposed anesthesia with the patient or  authorized representative who has indicated his/her understanding and acceptance.     Dental advisory given  Plan Discussed with: CRNA and Anesthesiologist  Anesthesia Plan Comments: (Discussed with patient risks of MAC including, but not limited to, minor pain or discomfort, hearing people in the room, and possible need for backup general anesthesia. Risks for general anesthesia also discussed including, but not limited to, sore throat, hoarse voice, chipped/damaged teeth, injury to vocal cords, nausea and vomiting, allergic reactions, lung infection, heart attack, stroke, and death. All questions answered.  )        Anesthesia Quick Evaluation

## 2022-08-25 NOTE — Transfer of Care (Signed)
Immediate Anesthesia Transfer of Care Note  Patient: Jermaine James.  Procedure(s) Performed: COLONOSCOPY WITH PROPOFOL  Patient Location: Endoscopy Unit  Anesthesia Type:MAC  Level of Consciousness: awake  Airway & Oxygen Therapy: Patient Spontanous Breathing and Patient connected to nasal cannula oxygen  Post-op Assessment: Report given to RN and Post -op Vital signs reviewed and stable  Post vital signs: Reviewed and stable  Last Vitals:  Vitals Value Taken Time  BP    Temp    Pulse    Resp    SpO2      Last Pain:  Vitals:   08/25/22 0955  TempSrc: Temporal  PainSc: 0-No pain         Complications: No notable events documented.

## 2022-08-25 NOTE — Interval H&P Note (Signed)
History and Physical Interval Note:  08/25/2022 10:59 AM  Jermaine James.  has presented today for surgery, with the diagnosis of rectal bleeding.  The various methods of treatment have been discussed with the patient and family. After consideration of risks, benefits and other options for treatment, the patient has consented to  Procedure(s): COLONOSCOPY WITH PROPOFOL (N/A) as a surgical intervention.  The patient's history has been reviewed, patient examined, no change in status, stable for surgery.  I have reviewed the patient's chart and labs.  Questions were answered to the patient's satisfaction.     [name]

## 2022-08-25 NOTE — Anesthesia Procedure Notes (Signed)
Procedure Name: MAC Date/Time: 08/25/2022 11:16 AM  Performed by: Lieutenant Diego, CRNAPre-anesthesia Checklist: Patient identified, Emergency Drugs available, Suction available, Patient being monitored and Timeout performed Patient Re-evaluated:Patient Re-evaluated prior to induction Oxygen Delivery Method: Nasal cannula Preoxygenation: Pre-oxygenation with 100% oxygen Induction Type: IV induction

## 2022-08-26 ENCOUNTER — Ambulatory Visit (INDEPENDENT_AMBULATORY_CARE_PROVIDER_SITE_OTHER): Payer: Medicare Other | Admitting: Gastroenterology

## 2022-08-26 ENCOUNTER — Encounter: Payer: Self-pay | Admitting: Gastroenterology

## 2022-08-26 VITALS — BP 120/70 | HR 85 | Ht 71.0 in | Wt 289.0 lb

## 2022-08-26 DIAGNOSIS — K641 Second degree hemorrhoids: Secondary | ICD-10-CM | POA: Diagnosis not present

## 2022-08-26 NOTE — Patient Instructions (Signed)

## 2022-08-26 NOTE — Progress Notes (Signed)
PROCEDURE NOTE: The patient presents with symptomatic grade II-III  hemorrhoids, requesting rubber band ligation of his/her hemorrhoidal disease.  All risks, benefits and alternative forms of therapy were described and informed consent was obtained.  In the Left Lateral Decubitus position anoscopic examination revealed grade II hemorrhoids in the Right posterior, left lateral and right anterior position(s).  The anorectum was pre-medicated with 0.125% NTG and Recticare The decision was made to band the right posterior internal hemorrhoid, and the [place] was used to perform band ligation without complication.  Digital anorectal examination was then performed to assure proper positioning of the band, and to adjust the banded tissue as required.  The patient was discharged home without pain or other issues.  Dietary and behavioral recommendations were given and along with follow-up instructions.       The patient will return in 3-4 weeks for  follow-up and possible additional banding as required. No complications were encountered and the patient tolerated the procedure well.   Damaris Hippo , MD [phone]

## 2022-08-29 ENCOUNTER — Encounter (HOSPITAL_COMMUNITY): Payer: Self-pay | Admitting: Gastroenterology

## 2022-08-31 ENCOUNTER — Ambulatory Visit (INDEPENDENT_AMBULATORY_CARE_PROVIDER_SITE_OTHER): Payer: Medicare Other

## 2022-08-31 DIAGNOSIS — R7989 Other specified abnormal findings of blood chemistry: Secondary | ICD-10-CM | POA: Diagnosis not present

## 2022-08-31 DIAGNOSIS — E291 Testicular hypofunction: Secondary | ICD-10-CM | POA: Diagnosis not present

## 2022-08-31 DIAGNOSIS — E349 Endocrine disorder, unspecified: Secondary | ICD-10-CM

## 2022-08-31 NOTE — Progress Notes (Signed)
Per orders of Dr. Sarajane Jews, injection of Testosterone Cypionate 200 mg/ml given by Daymond Cordts L Tobey Lippard. Patient tolerated injection well.

## 2022-09-07 ENCOUNTER — Ambulatory Visit (INDEPENDENT_AMBULATORY_CARE_PROVIDER_SITE_OTHER): Payer: Medicare Other | Admitting: *Deleted

## 2022-09-07 DIAGNOSIS — E349 Endocrine disorder, unspecified: Secondary | ICD-10-CM

## 2022-09-07 DIAGNOSIS — E291 Testicular hypofunction: Secondary | ICD-10-CM

## 2022-09-07 MED ORDER — TESTOSTERONE CYPIONATE 200 MG/ML IM SOLN: 200.0000 mg | Freq: Once | INTRAMUSCULAR | Status: AC

## 2022-09-07 NOTE — Progress Notes (Signed)
Per orders of Dr. Fry, injection of Testosterone cypionate 200mg given by Demarko Zeimet A. Patient tolerated injection well.  

## 2022-09-14 ENCOUNTER — Encounter: Payer: Self-pay | Admitting: Primary Care

## 2022-09-14 ENCOUNTER — Ambulatory Visit (INDEPENDENT_AMBULATORY_CARE_PROVIDER_SITE_OTHER): Payer: Medicare Other | Admitting: Primary Care

## 2022-09-14 ENCOUNTER — Ambulatory Visit (INDEPENDENT_AMBULATORY_CARE_PROVIDER_SITE_OTHER): Payer: Medicare Other

## 2022-09-14 VITALS — BP 128/74 | HR 88 | Temp 98.0°F | Ht 71.0 in | Wt 294.2 lb

## 2022-09-14 DIAGNOSIS — E349 Endocrine disorder, unspecified: Secondary | ICD-10-CM | POA: Diagnosis not present

## 2022-09-14 DIAGNOSIS — G4733 Obstructive sleep apnea (adult) (pediatric): Secondary | ICD-10-CM | POA: Diagnosis not present

## 2022-09-14 DIAGNOSIS — E291 Testicular hypofunction: Secondary | ICD-10-CM

## 2022-09-14 MED ORDER — TESTOSTERONE CYPIONATE 200 MG/ML IM SOLN
200.0000 mg | INTRAMUSCULAR | Status: AC
  Administered 2022-09-14 – 2022-09-21 (×2): 200 mg via INTRAMUSCULAR

## 2022-09-14 NOTE — Assessment & Plan Note (Addendum)
-  Patient is 100% compliant with CPAP use last 30 days. Current pressure 14cm h20; Residual AHI 1.4/hour. Patient has been snoring more. Recommend increasing CPAP pressure 16cm h20. Also advised he focus on side sleeping position and/or getting wedge pillow to elevate head while sleeping on his back. If above does not help snoring we can refer to orthodontics for consideration for oral appliance. Encourage weight loss efforts. FU in 1 year or sooner if needed.

## 2022-09-14 NOTE — Progress Notes (Signed)
$'@Patient'v$  ID: Jermaine Mercy., male    DOB: May 12, 1956, 67 y.o.   MRN: [id number]  Chief Complaint  Patient presents with   Follow-up    Pt states he still snores wonders if setting needs to be adjusted    Referring provider: Laurey Morale, MD  HPI: 67 year old male, never smoked. Past medical history significant for chronic kidney disease, hyperlipidemia, gout, hypertension, diabetes, OA.  09/14/2022- Interim hx Patient presents today for sleep follow-up.  Patient had sleep study in 2006 that showed severe obstructive sleep apnea, AHI 101 an hour with SpO2 low 69%.  Patient is compliant with CPAP use.  Pressure 14 cm H2O.  Sleeping well at night.  His wife has noticed he has been snoring more.  Typically happens when sleeping on his back.  He has lost about 5 pounds since 2021.  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 of post-op N/V   High triglycerides    no current med.   History of gout    History of kidney stones    Hypertension    states under control with med., has been on med. ~ 23 yr.   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 90 tablet 3   lisinopril-hydrochlorothiazide (ZESTORETIC) 20-25 MG tablet Take 1 tablet by mouth daily. 90 tablet 3   metFORMIN (GLUCOPHAGE) 1000 MG tablet Take 1 tablet (1,000 mg total) by mouth 2 (two) times daily with a meal. 180 tablet 3   testosterone cypionate (DEPOTESTOSTERONE CYPIONATE) 200 MG/ML injection Inject 1 mL (200 mg total) into the muscle every 7 (seven) days. 10 mL 5   tadalafil (CIALIS) 20 MG tablet Take 1 tablet (20 mg total) by mouth as needed for erectile dysfunction. 10 tablet 11   Facility-Administered Medications Prior to Visit  Medication Dose Route Frequency Provider Last Rate Last Admin   testosterone cypionate (DEPOTESTOSTERONE CYPIONATE) injection 200 mg  200 mg Intramuscular Weekly Laurey Morale, MD   200 mg at 09/07/22 1008   testosterone cypionate (DEPOTESTOSTERONE CYPIONATE) injection 200 mg  200 mg Intramuscular Q14 Days Laurey Morale, MD   200 mg at 08/31/22 1007   testosterone cypionate (DEPOTESTOSTERONE CYPIONATE) injection 200 mg  200 mg Intramuscular Once Laurey Morale, MD       testosterone cypionate (DEPOTESTOSTERONE  CYPIONATE) injection 200 mg  200 mg Intramuscular Weekly Laurey Morale, MD   200 mg at 09/14/22 1129      Review of Systems  Review of Systems  Constitutional: Negative.   Respiratory: Negative.    Cardiovascular: Negative.    Physical Exam  BP 128/74 (BP Location: Left Arm, Patient Position: Sitting, Cuff Size: Normal)   Pulse 88   Temp 98 F (36.7 C) (Oral)   Ht '5\' 11"'$  (1.803 m)   Wt 294 lb 3.2 oz (133.4 kg)   SpO2 96%   BMI 41.03 kg/m  Physical Exam Constitutional:      Appearance: Normal appearance.  HENT:     Head: Normocephalic and atraumatic.     Mouth/Throat:     Mouth: Mucous  membranes are moist.     Pharynx: Oropharynx is clear.  Cardiovascular:     Rate and Rhythm: Normal rate and regular rhythm.  Pulmonary:     Effort: Pulmonary effort is normal.     Breath sounds: Normal breath sounds.  Musculoskeletal:        General: Normal range of motion.  Skin:    General: Skin is dry.  Neurological:     General: No focal deficit present.     Mental Status: He is alert and oriented to person, place, and time. Mental status is at baseline.  Psychiatric:        Mood and Affect: Mood normal.        Behavior: Behavior normal.        Thought Content: Thought content normal.        Judgment: Judgment normal.      Lab Results:  CBC    Component Value Date/Time   WBC 6.6 08/04/2022 1126   RBC 5.22 08/04/2022 1126   HGB 16.1 08/04/2022 1126   HCT 47.7 08/04/2022 1126   PLT 300.0 08/04/2022 1126   MCV 91.5 08/04/2022 1126   MCH 30.3 01/30/2019 0936   MCHC 33.7 08/04/2022 1126   RDW 15.1 08/04/2022 1126   LYMPHSABS 1.2 08/04/2022 1126   MONOABS 0.6 08/04/2022 1126   EOSABS 0.3 08/04/2022 1126   BASOSABS 0.1 08/04/2022 1126    BMET    Component Value Date/Time   NA 137 06/23/2022 1606   K 3.5 06/23/2022 1606   CL 102 06/23/2022 1606   CO2 25 06/23/2022 1606   GLUCOSE 111 (H) 06/23/2022 1606   BUN 21 06/23/2022 1606   CREATININE 1.25 06/23/2022 1606   CALCIUM 9.8 06/23/2022 1606   GFRNONAA >60 01/30/2019 0936   GFRAA >60 01/30/2019 0936    BNP No results found for: "BNP"  ProBNP No results found for: "PROBNP"  Imaging: No results found.   Assessment & Plan:   OSA (obstructive sleep apnea) - Patient is 100% compliant with CPAP use last 30 days. Current pressure 14cm h20; Residual AHI 1.4/hour. Patient has been snoring more. Recommend increasing CPAP pressure 16cm h20. Also advised he focus on side sleeping position and/or getting wedge pillow to elevate head while sleeping on his back. If above does not help snoring we can refer to  orthodontics for consideration for oral appliance. Encourage weight loss efforts. FU in 1 year or sooner if needed.  Martyn Ehrich, NP 09/14/2022

## 2022-09-14 NOTE — Progress Notes (Signed)
Per orders of Dr. Fry, injection of Testosterone 200 mg/mL given by Nashira Mcglynn N Tiearra Colwell. Patient tolerated injection well.  

## 2022-09-14 NOTE — Patient Instructions (Addendum)
Sleep apnea is controlled on current CPAP pressure setting You are having no significant residual apneas  Recommendation: - We will adjust CPAP pressure settings  - Focus on side sleeping position - Look at getting wedge pillow to elevate head if sleeping on your back - Work on weight loss efforts  - If above plan does not help reduce snoring we can refer you to orthodontics for oral appliance   Orders: - Increase CPAP 16cm h20   Follow-up: - 1 year with Beth NP    CPAP and BIPAP Information CPAP and BIPAP are methods that use air pressure to keep your airways open and to help you breathe well. CPAP and BIPAP use different amounts of pressure. Your health care provider will tell you whether CPAP or BIPAP would be more helpful for you. CPAP stands for "continuous positive airway pressure." With CPAP, the amount of pressure stays the same while you breathe in (inhale) and out (exhale). BIPAP stands for "bi-level positive airway pressure." With BIPAP, the amount of pressure will be higher when you inhale and lower when you exhale. This allows you to take larger breaths. CPAP or BIPAP may be used in the hospital, or your health care provider may want you to use it at home. You may need to have a sleep study before your health care provider can order a machine for you to use at home. What are the advantages? CPAP or BIPAP can be helpful if you have: Sleep apnea. Chronic obstructive pulmonary disease (COPD). Heart failure. Medical conditions that cause muscle weakness, including muscular dystrophy or amyotrophic lateral sclerosis (ALS). Other problems that cause breathing to be shallow, weak, abnormal, or difficult. CPAP and BIPAP are most commonly used for obstructive sleep apnea (OSA) to keep the airways from collapsing when the muscles relax during sleep. What are the risks? Generally, this is a safe treatment. However, problems may occur, including: Irritated skin or skin sores if the  mask does not fit properly. Dry or stuffy nose or nosebleeds. Dry mouth. Feeling gassy or bloated. Sinus or lung infection if the equipment is not cleaned properly. When should CPAP or BIPAP be used? In most cases, the mask only needs to be worn during sleep. Generally, the mask needs to be worn throughout the night and during any daytime naps. People with certain medical conditions may also need to wear the mask at other times, such as when they are awake. Follow instructions from your health care provider about when to use the machine. What happens during CPAP or BIPAP?  Both CPAP and BIPAP are provided by a small machine with a flexible plastic tube that attaches to a plastic mask that you wear. Air is blown through the mask into your nose or mouth. The amount of pressure that is used to blow the air can be adjusted on the machine. Your health care provider will set the pressure setting and help you find the best mask for you. Tips for using the mask Because the mask needs to be snug, some people feel trapped or closed-in (claustrophobic) when first using the mask. If you feel this way, you may need to get used to the mask. One way to do this is to hold the mask loosely over your nose or mouth and then gradually apply the mask more snugly. You can also gradually increase the amount of time that you use the mask. Masks are available in various types and sizes. If your mask does not fit well, talk  with your health care provider about getting a different one. Some common types of masks include: Full face masks, which fit over the mouth and nose. Nasal masks, which fit over the nose. Nasal pillow or prong masks, which fit into the nostrils. If you are using a mask that fits over your nose and you tend to breathe through your mouth, a chin strap may be applied to help keep your mouth closed. Use a skin barrier to protect your skin as told by your health care provider. Some CPAP and BIPAP machines  have alarms that may sound if the mask comes off or develops a leak. If you have trouble with the mask, it is very important that you talk with your health care provider about finding a way to make the mask easier to tolerate. Do not stop using the mask. There could be a negative impact on your health if you stop using the mask. Tips for using the machine Place your CPAP or BIPAP machine on a secure table or stand near an electrical outlet. Know where the on/off switch is on the machine. Follow instructions from your health care provider about how to set the pressure on your machine and when you should use it. Do not eat or drink while the CPAP or BIPAP machine is on. Food or fluids could get pushed into your lungs by the pressure of the CPAP or BIPAP. For home use, CPAP and BIPAP machines can be rented or purchased through home health care companies. Many different brands of machines are available. Renting a machine before purchasing may help you find out which particular machine works well for you. Your health insurance company may also decide which machine you may get. Keep the CPAP or BIPAP machine and attachments clean. Ask your health care provider for specific instructions. Check the humidifier if you have a dry stuffy nose or nosebleeds. Make sure it is working correctly. Follow these instructions at home: Take over-the-counter and prescription medicines only as told by your health care provider. Ask if you can take sinus medicine if your sinuses are blocked. Do not use any products that contain nicotine or tobacco. These products include cigarettes, chewing tobacco, and vaping devices, such as e-cigarettes. If you need help quitting, ask your health care provider. Keep all follow-up visits. This is important. Contact a health care provider if: You have redness or pressure sores on your head, face, mouth, or nose from the mask or head gear. You have trouble using the CPAP or BIPAP  machine. You cannot tolerate wearing the CPAP or BIPAP mask. Someone tells you that you snore even when wearing your CPAP or BIPAP. Get help right away if: You have trouble breathing. You feel confused. Summary CPAP and BIPAP are methods that use air pressure to keep your airways open and to help you breathe well. If you have trouble with the mask, it is very important that you talk with your health care provider about finding a way to make the mask easier to tolerate. Do not stop using the mask. There could be a negative impact to your health if you stop using the mask. Follow instructions from your health care provider about when to use the machine. This information is not intended to replace advice given to you by your health care provider. Make sure you discuss any questions you have with your health care provider. Document Revised: 03/10/2021 Document Reviewed: 07/10/2020 Elsevier Patient Education  [place].

## 2022-09-16 NOTE — Progress Notes (Signed)
Reviewed and agree with assessment/plan.   Chesley Mires, MD [facility] Pulmonary/Critical Care 09/16/2022, 8:44 AM Pager:  [phone]

## 2022-09-21 ENCOUNTER — Ambulatory Visit (INDEPENDENT_AMBULATORY_CARE_PROVIDER_SITE_OTHER): Payer: Medicare Other

## 2022-09-21 DIAGNOSIS — E291 Testicular hypofunction: Secondary | ICD-10-CM | POA: Diagnosis not present

## 2022-09-21 MED ORDER — TESTOSTERONE CYPIONATE 200 MG/ML IM SOLN: 200.0000 mg | INTRAMUSCULAR | Status: AC

## 2022-09-21 NOTE — Progress Notes (Signed)
Per orders of Dr. Fry, injection of Testosterone 200 mg/mL given by Menucha Dicesare N Abdulkarim Eberlin. Patient tolerated injection well.  

## 2022-09-26 ENCOUNTER — Encounter: Payer: Medicare Other | Admitting: Gastroenterology

## 2022-09-28 ENCOUNTER — Ambulatory Visit: Payer: Medicare Other

## 2022-09-28 ENCOUNTER — Ambulatory Visit (INDEPENDENT_AMBULATORY_CARE_PROVIDER_SITE_OTHER): Payer: Medicare Other | Admitting: *Deleted

## 2022-09-28 DIAGNOSIS — E349 Endocrine disorder, unspecified: Secondary | ICD-10-CM | POA: Diagnosis not present

## 2022-09-28 MED ORDER — TESTOSTERONE CYPIONATE 200 MG/ML IM SOLN
200.0000 mg | Freq: Once | INTRAMUSCULAR | Status: AC
  Administered 2022-09-28: 200 mg via INTRAMUSCULAR

## 2022-09-28 NOTE — Progress Notes (Signed)
Per orders of Dr. Sarajane Jews, injection of Testosterone cypionate 22m given by FLahoma CrockerA. Patient tolerated injection well.

## 2022-10-05 ENCOUNTER — Ambulatory Visit: Payer: Medicare Other

## 2022-10-06 ENCOUNTER — Ambulatory Visit (INDEPENDENT_AMBULATORY_CARE_PROVIDER_SITE_OTHER): Payer: Medicare Other

## 2022-10-06 DIAGNOSIS — E291 Testicular hypofunction: Secondary | ICD-10-CM

## 2022-10-06 MED ORDER — TESTOSTERONE CYPIONATE 200 MG/ML IM SOLN
200.0000 mg | INTRAMUSCULAR | Status: AC
  Administered 2022-10-06 – 2022-10-12 (×2): 200 mg via INTRAMUSCULAR

## 2022-10-07 NOTE — Telephone Encounter (Signed)
[facility], is the patient able to change to a different DME? We can place a new order after getting the OK from the provider but do you know if he is he able to?

## 2022-10-11 ENCOUNTER — Other Ambulatory Visit: Payer: Self-pay

## 2022-10-11 DIAGNOSIS — G4733 Obstructive sleep apnea (adult) (pediatric): Secondary | ICD-10-CM

## 2022-10-11 NOTE — Telephone Encounter (Signed)
Yes

## 2022-10-11 NOTE — Telephone Encounter (Addendum)
The issue is patient was with [facility] which is owned by [employer].  [facility] relocated to [facility] a few years ago and their patients were carried along there with them.  The pt should be able to switch to a different dme.  We have his old sleep study on file.  Order would just need to be placed whenever he has a need.

## 2022-10-12 ENCOUNTER — Ambulatory Visit (INDEPENDENT_AMBULATORY_CARE_PROVIDER_SITE_OTHER): Payer: Medicare Other

## 2022-10-12 DIAGNOSIS — N529 Male erectile dysfunction, unspecified: Secondary | ICD-10-CM

## 2022-10-12 DIAGNOSIS — E291 Testicular hypofunction: Secondary | ICD-10-CM | POA: Diagnosis not present

## 2022-10-12 NOTE — Progress Notes (Signed)
Per orders of Dr. Sarajane Jews, injection of Testosterone Cypionate Inj. '200mg'$ /mL given by Encarnacion Slates on Left Ventrogluteal. Patient tolerated injection well.

## 2022-10-12 NOTE — Progress Notes (Signed)
Per orders of Dr. Sarajane Jews injection of Testosterone 200 mg/mL given by Wyvonne Lenz. Patient tolerated injection well.

## 2022-10-13 ENCOUNTER — Ambulatory Visit: Payer: Medicare Other

## 2022-10-19 ENCOUNTER — Ambulatory Visit (INDEPENDENT_AMBULATORY_CARE_PROVIDER_SITE_OTHER): Payer: Medicare Other

## 2022-10-19 ENCOUNTER — Encounter: Payer: Self-pay | Admitting: Gastroenterology

## 2022-10-19 ENCOUNTER — Ambulatory Visit (INDEPENDENT_AMBULATORY_CARE_PROVIDER_SITE_OTHER): Payer: Medicare Other | Admitting: Gastroenterology

## 2022-10-19 VITALS — BP 120/70 | HR 99 | Ht 71.0 in | Wt 298.2 lb

## 2022-10-19 DIAGNOSIS — K641 Second degree hemorrhoids: Secondary | ICD-10-CM

## 2022-10-19 DIAGNOSIS — E291 Testicular hypofunction: Secondary | ICD-10-CM

## 2022-10-19 MED ORDER — TESTOSTERONE CYPIONATE 200 MG/ML IM SOLN
200.0000 mg | Freq: Once | INTRAMUSCULAR | Status: AC
  Administered 2022-10-19: 200 mg via INTRAMUSCULAR

## 2022-10-19 NOTE — Patient Instructions (Signed)
HEMORRHOID BANDING PROCEDURE    FOLLOW-UP CARE   The procedure you have had should have been relatively painless since the banding of the area involved does not have nerve endings and there is no pain sensation.  The rubber band cuts off the blood supply to the hemorrhoid and the band may fall off as soon as 48 hours after the banding (the band may occasionally be seen in the toilet bowl following a bowel movement). You may notice a temporary feeling of fullness in the rectum which should respond adequately to plain Tylenol or Motrin.  Following the banding, avoid strenuous exercise that evening and resume full activity the next day.  A sitz bath (soaking in a warm tub) or bidet is soothing, and can be useful for cleansing the area after bowel movements.     To avoid constipation, take two tablespoons of natural wheat bran, natural oat bran, flax, Benefiber or any over the counter fiber supplement and increase your water intake to 7-8 glasses daily.    Unless you have been prescribed anorectal medication, do not put anything inside your rectum for two weeks: No suppositories, enemas, fingers, etc.  Occasionally, you may have more bleeding than usual after the banding procedure.  This is often from the untreated hemorrhoids rather than the treated one.  Don't be concerned if there is a tablespoon or so of blood.  If there is more blood than this, lie flat with your bottom higher than your head and apply an ice pack to the area. If the bleeding does not stop within a half an hour or if you feel faint, call our office at [phone] or go to the emergency room.  Problems are not common; however, if there is a substantial amount of bleeding, severe pain, chills, fever or difficulty passing urine (very rare) or other problems, you should call us at [phone] or report to the nearest emergency room.  Do not stay seated continuously for more than 2-3 hours for a day or two after the procedure.   Tighten your buttock muscles 10-15 times every two hours and take 10-15 deep breaths every 1-2 hours.  Do not spend more than a few minutes on the toilet if you cannot empty your bowel; instead re-visit the toilet at a later time.

## 2022-10-19 NOTE — Progress Notes (Signed)
PROCEDURE NOTE: The patient presents with symptomatic grade II  hemorrhoids, requesting rubber band ligation of his/her hemorrhoidal disease.  All risks, benefits and alternative forms of therapy were described and informed consent was obtained.   The anorectum was pre-medicated with 0.125% NTG and Recticare The decision was made to band the Left lateral internal hemorrhoid, and the [place] was used to perform band ligation without complication.  Digital anorectal examination was then performed to assure proper positioning of the band, and to adjust the banded tissue as required.  The patient was discharged home without pain or other issues.  Dietary and behavioral recommendations were given and along with follow-up instructions.      No complications were encountered and the patient tolerated the procedure well.   Damaris Hippo , MD [phone]

## 2022-10-19 NOTE — Progress Notes (Signed)
Per orders of Dr. Sarajane Jews, injection of Testosterone 200 mg/mL given by Wyvonne Lenz. Patient tolerated injection well.

## 2022-10-20 ENCOUNTER — Other Ambulatory Visit (HOSPITAL_COMMUNITY): Payer: Self-pay

## 2022-10-21 ENCOUNTER — Other Ambulatory Visit (HOSPITAL_COMMUNITY): Payer: Self-pay

## 2022-10-26 ENCOUNTER — Ambulatory Visit (INDEPENDENT_AMBULATORY_CARE_PROVIDER_SITE_OTHER): Payer: Medicare Other

## 2022-10-26 DIAGNOSIS — E291 Testicular hypofunction: Secondary | ICD-10-CM

## 2022-10-26 MED ORDER — TESTOSTERONE CYPIONATE 200 MG/ML IM SOLN
200.0000 mg | INTRAMUSCULAR | Status: AC
  Administered 2022-10-26: 200 mg via INTRAMUSCULAR

## 2022-10-26 NOTE — Progress Notes (Signed)
Per orders of Dr. Fry, injection of Testosterone 200 mg/mL given by Carmel Waddington N Saul Dorsi. Patient tolerated injection well.   

## 2022-11-02 ENCOUNTER — Ambulatory Visit (INDEPENDENT_AMBULATORY_CARE_PROVIDER_SITE_OTHER): Payer: Medicare Other

## 2022-11-02 DIAGNOSIS — E291 Testicular hypofunction: Secondary | ICD-10-CM

## 2022-11-02 MED ORDER — TESTOSTERONE CYPIONATE 200 MG/ML IM SOLN
200.0000 mg | Freq: Once | INTRAMUSCULAR | Status: AC
  Administered 2022-11-02: 200 mg via INTRAMUSCULAR

## 2022-11-02 NOTE — Progress Notes (Signed)
Per orders of Dr. Fry, injection of Testosterone 200 mg/mL given by Ashleah Valtierra N Broghan Pannone. Patient tolerated injection well.   

## 2022-11-04 ENCOUNTER — Encounter: Payer: Self-pay | Admitting: Family Medicine

## 2022-11-04 NOTE — Telephone Encounter (Signed)
I agree he should stop the testosterone shots. The lab order is still active, so he can set up the lab appt any time he wants to

## 2022-11-07 ENCOUNTER — Encounter: Payer: Self-pay | Admitting: Internal Medicine

## 2022-11-07 ENCOUNTER — Ambulatory Visit (INDEPENDENT_AMBULATORY_CARE_PROVIDER_SITE_OTHER): Payer: Medicare Other | Admitting: Internal Medicine

## 2022-11-07 VITALS — BP 120/62 | HR 89 | Ht 71.0 in | Wt 297.0 lb

## 2022-11-07 DIAGNOSIS — E785 Hyperlipidemia, unspecified: Secondary | ICD-10-CM | POA: Diagnosis not present

## 2022-11-07 DIAGNOSIS — E1165 Type 2 diabetes mellitus with hyperglycemia: Secondary | ICD-10-CM | POA: Diagnosis not present

## 2022-11-07 LAB — POCT GLYCOSYLATED HEMOGLOBIN (HGB A1C): Hemoglobin A1C: 5.8 % — AB (ref 4.0–5.6)

## 2022-11-07 MED ORDER — METFORMIN HCL 1000 MG PO TABS: 1000.0000 mg | ORAL_TABLET | Freq: Two times a day (BID) | ORAL | 3 refills | Status: DC

## 2022-11-07 NOTE — Patient Instructions (Addendum)
Please continue: - Metformin 1000 mg 2x a day with meals  Please return in 6 months with your sugar log.

## 2022-11-07 NOTE — Progress Notes (Signed)
Patient ID: Jermaine Mercy., male   DOB: 23-Jul-1956, 67 y.o.   MRN: [id number]   HPI: Jermaine Mcsweeney. is a 67 y.o.-year-old male, initially referred by his PCP, Dr. Sarajane Jews, returning for follow-up for DM2, dx in 2012, non-insulin-dependent, now controlled, without long-term complications. He is the husband of Jermaine James, who is also my pt.  Last visit 6 months ago.  Interim history: No increased urination, blurry vision, nausea, chest pain. He is still drinking regular sodas - 1-2 x a week, during the weekends.   Reviewed HbA1c level: Lab Results  Component Value Date   HGBA1C 6.1 (A) 06/03/2022   HGBA1C 6.7 (H) 12/07/2021   HGBA1C 6.4 (A) 07/27/2021   HGBA1C 6.1 (A) 02/16/2021   HGBA1C 6.5 (A) 09/29/2020   HGBA1C 6.7 (A) 05/28/2020   HGBA1C 6.2 (A) 01/22/2020   HGBA1C 7.6 (A) 10/18/2019   HGBA1C 8.3 (H) 07/18/2019   HGBA1C 7.6 (H) 01/30/2019   HGBA1C 6.8 (H) 02/01/2018   HGBA1C 7.0 (H) 09/02/2016   HGBA1C 6.5 04/20/2016   HGBA1C 6.1 05/12/2015   HGBA1C 6.5 01/16/2014   HGBA1C 7.0 (H) 08/20/2012   HGBA1C 6.5 (H) 07/02/2012   HGBA1C 6.6 (H) 07/05/2011   HGBA1C 6.4 06/23/2010   HGBA1C 6.4 10/30/2009   HGBA1C 6.4 07/24/2009   HGBA1C 6.3 (H) 04/20/2007   Pt is on a regimen of: - Metformin 500 mg 2x a day, with meals >> 1000 mg twice a day  He checks sugars 0 to 1x a day: - am: 116-125, 137 >> 101-128 >> 106-128 >> 105-112 - 2h after b'fast: 115-138, 177 >> 121-147 >> n/c>> 109-122 - before lunch: 118-140 >> 125-147 >> 107-128>> 109-122 - 2h after lunch: 128-140 >> 126-165 >> 122 >> 121-140 - before dinner: 116-132, 165 >> 137, 144 >> 115-136>> 115-128 - 2h after dinner: 144-197 >> 149-161, 168 >> 125-138 - bedtime:  119-213, 239 >> 139-168, 217 >> n/c - nighttime: n/c Lowest sugar was 98 ... >> 103>> 105 Highest sugar was 239 (pizza, Soda) ... >> 168>> 140  Glucometer: none >> True Focus  Pt's meals are: - Breakfast: cheerios + 1-2% milk; pancake + waffle +  bacon; - Lunch: sandwich - PB/turkey/nan - Dinner: meat + starch + veggies - Snacks: 1-2x a day -  -No CKD, last BUN/creatinine:  Lab Results  Component Value Date   BUN 21 06/23/2022   BUN 15 12/07/2021   CREATININE 1.25 06/23/2022   CREATININE 0.85 12/07/2021   No MAU: Lab Results  Component Value Date   MICRALBCREAT 0.8 09/29/2020   MICRALBCREAT 1.7 01/22/2020   MICRALBCREAT 1.0 05/12/2015   MICRALBCREAT 0.7 08/20/2012   MICRALBCREAT 0.3 07/05/2011   MICRALBCREAT 4.3 07/24/2009   MICRALBCREAT 4.3 04/20/2007  On lisinopril 20.  -+ HL; last set of lipids: Lab Results  Component Value Date   CHOL 135 12/07/2021   HDL 48.00 12/07/2021   LDLCALC 60 12/07/2021   LDLDIRECT 86.0 02/01/2018   TRIG 139.0 12/07/2021   CHOLHDL 3 12/07/2021  On fenofibrate 160. He refused statins.  - last eye exam was in 2023: No DR. Artificial eye OS.  + Cataract OD >> had sx.  - no numbness and tingling in his feet. Last foot exam 10/27/2021 - Dr. Blenda Mounts.  He sees her on an as-needed basis.  Has Achilles tendonitis >> improved. Wear shoe insert.  Pt has no FH of DM.  He also has a history of OSA-on CPAP, gout, HTN. He also has  a history of hypogonadism.  He was on testosterone replacement, but had side effects: rage, irritability, paranoia - stopped.-  ROS: + see HPI  I reviewed pt's medications, allergies, PMH, social hx, family hx, and changes were documented in the history of present illness. Otherwise, unchanged from my initial visit note.  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 of post-op N/V   High triglycerides    no current med.   History of gout    History of kidney stones    Hypertension    states under control with med., has been on med. ~ 67 yr.   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 Hilarie Fredrickson, adenomatous polyps, repeat in 5 yrs   COLONOSCOPY WITH PROPOFOL N/A 08/25/2022   Procedure: COLONOSCOPY WITH PROPOFOL;  Surgeon: Mauri Pole, MD;  Location: [facility];  Service: Gastroenterology;  Laterality: N/A;   CYSTOSCOPY  2011   "related to trace blood in urine"   EYE SURGERY Left 1961   traumatic loss of eye   INGUINAL HERNIA REPAIR Left    KNEE ARTHROSCOPY Left 1990s?   KNEE ARTHROSCOPY WITH MEDIAL MENISECTOMY Right 11/08/2012   Procedure: RIGHT KNEE ARTHROSCOPY WITH PARTIAL MEDIAL AND LATERAL MENISECTOMIES, CHONDROPLASTY;  Surgeon: [name], MD;  Location: [facility];  Service: Orthopedics;  Laterality: Right;  RIGHT KNEE SCOPE WITH PARTIAL MEDIAL AND LATERAL  MENISCECTOMIES  AND CHONDROPLASTY   KNEE CLOSED REDUCTION Left 08/18/2016   Procedure: LEFT  MANIPULATION KNEE;  Surgeon: [name], MD;  Location: [facility];  Service: Orthopedics;  Laterality: Left;   LIPOMA EXCISION Left    upper back   MOHS SURGERY Left 10/2015   "forehead"   RETINAL TEAR REPAIR CRYOTHERAPY Right 11/2017   SPINE SURGERY  02/07/2019   TOTAL KNEE ARTHROPLASTY Left 05/18/2016   Procedure: LEFT TOTAL KNEE ARTHROPLASTY;  Surgeon: [name], MD;  Location: [facility];  Service: Orthopedics;  Laterality: Left;   TOTAL KNEE ARTHROPLASTY Right 09/14/2016   Procedure: TOTAL KNEE ARTHROPLASTY MANIPULATION LEFT KNEE;  Surgeon: [name], MD;  Location: [facility];  Service: Orthopedics;  Laterality: Right;   Social History   Socioeconomic History   Marital status: Married     Spouse name: Not on file   Number of children: 3   Years of education: Not on file   Highest education level: Not on file  Occupational History   Occupation: Customer service manager -retired  Tobacco Use   Smoking status: Never Smoker   Smokeless tobacco: Never Used  Substance and Sexual Activity   Alcohol use: Yes    Alcohol/week: 3.0 - 4.0 standard drinks    Types: Beer, wine, mixed   Drug use: No   Sexual activity: Yes  Other Topics Concern  Not on file  Social History Narrative   Not on file   Social Determinants of Health   Financial Resource Strain:    Difficulty of Paying Living Expenses: Not on file  Food Insecurity:    Worried About [place] in the Last Year: Not on file   Ran Out of Food in the Last Year: Not on file  Transportation Needs:    Lack of Transportation (Medical): Not on file   Lack of Transportation (Non-Medical): Not on file  Physical Activity:    Days of Exercise per Week: Not on file   Minutes of Exercise per Session: Not on file  Stress:    Feeling of Stress : Not on file  Social Connections:    Frequency of Communication with Friends and Family: Not on file   Frequency of Social Gatherings with Friends and Family: Not on file   Attends Religious Services: Not on file   Active Member of Clubs or Organizations: Not on file   Attends Archivist Meetings: Not on file   Marital Status: Not on file  Intimate Partner Violence:    Fear of Current or Ex-Partner: Not on file   Emotionally Abused: Not on file   Physically Abused: Not on file   Sexually Abused: Not on file   Current Outpatient Medications on File Prior to Visit  Medication Sig Dispense Refill   fenofibrate 160 MG tablet Take 1 tablet (160 mg total) by mouth daily. 90 tablet 3   lisinopril-hydrochlorothiazide (ZESTORETIC) 20-25 MG tablet Take 1 tablet by mouth daily. 90 tablet 3   metFORMIN (GLUCOPHAGE) 1000 MG tablet Take 1 tablet (1,000 mg total) by mouth 2 (two) times daily  with a meal. 180 tablet 3   testosterone cypionate (DEPOTESTOSTERONE CYPIONATE) 200 MG/ML injection Inject 1 mL (200 mg total) into the muscle every 7 (seven) days. 10 mL 5   Current Facility-Administered Medications on File Prior to Visit  Medication Dose Route Frequency Provider Last Rate Last Admin   testosterone cypionate (DEPOTESTOSTERONE CYPIONATE) injection 200 mg  200 mg Intramuscular Weekly Laurey Morale, MD   200 mg at 09/07/22 1008   testosterone cypionate (DEPOTESTOSTERONE CYPIONATE) injection 200 mg  200 mg Intramuscular Q14 Days Laurey Morale, MD   200 mg at 08/31/22 1007   testosterone cypionate (DEPOTESTOSTERONE CYPIONATE) injection 200 mg  200 mg Intramuscular Once Laurey Morale, MD       testosterone cypionate (DEPOTESTOSTERONE CYPIONATE) injection 200 mg  200 mg Intramuscular Weekly Laurey Morale, MD   200 mg at 09/21/22 1145   testosterone cypionate (DEPOTESTOSTERONE CYPIONATE) injection 200 mg  200 mg Intramuscular Weekly Laurey Morale, MD       testosterone cypionate (DEPOTESTOSTERONE CYPIONATE) injection 200 mg  200 mg Intramuscular Weekly Billie Ruddy, MD   200 mg at 10/12/22 1006   testosterone cypionate (DEPOTESTOSTERONE CYPIONATE) injection 200 mg  200 mg Intramuscular Weekly Laurey Morale, MD   200 mg at 10/26/22 1128   No Known Allergies Family History  Problem Relation Age of Onset   Anesthesia problems Father        post-op N/V   Colon cancer Neg Hx    Esophageal cancer Neg Hx    Rectal cancer Neg Hx    Stomach cancer Neg Hx    PE: BP 120/62   Pulse 89   Ht 5\' 11"  (1.803 m)   Wt 297 lb (134.7 kg)   SpO2 97%   BMI  41.42 kg/m  Wt Readings from Last 3 Encounters:  11/07/22 297 lb (134.7 kg)  10/19/22 298 lb 4 oz (135.3 kg)  09/14/22 294 lb 3.2 oz (133.4 kg)   Constitutional: overweight, in NAD Eyes:  no exophthalmos; L eye prosthetic ENT: no neck masses, no cervical lymphadenopathy Cardiovascular: RRR, No MRG Respiratory: CTA  B Musculoskeletal: no deformities Skin:no rashes Neurological: no tremor with outstretched hands  ASSESSMENT: 1. DM2, non-insulin-dependent, now controlled, without long-term complications, but with hyperglycemia  2. HL  3.  Obesity class III  PLAN:  1. Patient with longstanding, previously uncontrolled type 2 diabetes, on oral antidiabetic regimen with metformin only, with improved blood sugars in the last 2 years.  In the past, he was having high blood sugars after cereals in the morning and we discussed about healthier options for breakfast.  I also recommended to reduce bedtime snacks as much as possible.  At the last few visits we also discussed about the importance of stopping sweet drinks.  He still has regular sodas but only the weekend and less than before. -At today's visit, reviewing his detailed log, sugars appear to be at goal.  No hyper or hypoglycemia.  For now, we discussed about continuing metformin but if the sugars continue to improve, we may need to reduce the dose. -I suggested to: Patient Instructions  Please continue: - Metformin 1000 mg 2x a day with meals  Please return in 6 months with your sugar log.   - we checked his HbA1c: 5.8% (lower) - advised to check sugars at different times of the day - 1x a day, rotating check times - advised for yearly eye exams >> he is UTD - return to clinic in 4-6 months  2. HL -Reviewed latest lipid panel from 11/2021: Fractions at goal: Lab Results  Component Value Date   CHOL 135 12/07/2021   HDL 48.00 12/07/2021   LDLCALC 60 12/07/2021   LDLDIRECT 86.0 02/01/2018   TRIG 139.0 12/07/2021   CHOLHDL 3 12/07/2021  -He is on fenofibrate 160 mg daily.  He refused a statin despite a discussion about benefits beyond lowering cholesterol levels.  3.  Obesity class III -Continue metformin which has an appetite suppressant effect long-term -At last visit I again advised him to stop sweet drinks -Gained 4 pounds before last  visit and lost 2 pounds since then  Philemon Kingdom, MD PhD [facility]

## 2022-11-07 NOTE — Addendum Note (Signed)
Addended by: Elta Guadeloupe on: 11/07/2022 11:08 AM   Modules accepted: Orders

## 2022-11-09 ENCOUNTER — Ambulatory Visit: Payer: Medicare Other

## 2022-11-16 ENCOUNTER — Ambulatory Visit: Payer: Medicare Other

## 2022-11-22 ENCOUNTER — Other Ambulatory Visit (INDEPENDENT_AMBULATORY_CARE_PROVIDER_SITE_OTHER): Payer: Medicare Other

## 2022-11-22 DIAGNOSIS — E349 Endocrine disorder, unspecified: Secondary | ICD-10-CM

## 2022-11-22 LAB — TESTOSTERONE: Testosterone: 165.26 ng/dL — ABNORMAL LOW (ref 300.00–890.00)

## 2022-11-23 ENCOUNTER — Ambulatory Visit: Payer: Medicare Other

## 2022-11-24 ENCOUNTER — Encounter: Payer: Self-pay | Admitting: Family Medicine

## 2022-11-28 NOTE — Telephone Encounter (Signed)
I agree that we can investigate more. I would like to refer him to Urology if he agrees. They can look for root causes of this and they can treat it with alternative methods

## 2022-11-30 ENCOUNTER — Telehealth: Payer: Self-pay | Admitting: Family Medicine

## 2022-11-30 NOTE — Telephone Encounter (Signed)
Contacted [name] to schedule their annual wellness visit. Appointment made for 12/09/22.  Rudell Cobb AWV direct phone # [phone]

## 2022-12-05 ENCOUNTER — Other Ambulatory Visit: Payer: Self-pay | Admitting: Family Medicine

## 2022-12-09 ENCOUNTER — Ambulatory Visit (INDEPENDENT_AMBULATORY_CARE_PROVIDER_SITE_OTHER): Payer: Medicare Other

## 2022-12-09 VITALS — Ht 71.0 in | Wt 284.2 lb

## 2022-12-09 DIAGNOSIS — Z0000 Encounter for general adult medical examination without abnormal findings: Secondary | ICD-10-CM

## 2022-12-09 NOTE — Patient Instructions (Addendum)
Jermaine James , Thank you for taking time to come for your Medicare Wellness Visit. I appreciate your ongoing commitment to your health goals. Please review the following plan we discussed and let me know if I can assist you in the future.   These are the goals we discussed:  Goals       Patient Stated      Lose weight       Patient Stated      12/07/2021, wants to lose weight      Patient stated (pt-stated)      I want to continue to lose weight and lower my A1C.        This is a list of the screening recommended for you and due dates:  Health Maintenance  Topic Date Due   DTaP/Tdap/Td vaccine (2 - Tdap) 10/29/2019   Eye exam for diabetics  12/09/2022*   Yearly kidney health urinalysis for diabetes  12/10/2022*   COVID-19 Vaccine (4 - 2023-24 season) 12/25/2022*   Zoster (Shingles) Vaccine (1 of 2) 03/10/2023*   Pneumonia Vaccine (1 of 1 - PCV) 12/09/2023*   Hepatitis C Screening: USPSTF Recommendation to screen - Ages 18-79 yo.  12/09/2023*   Flu Shot  03/16/2023   Hemoglobin A1C  05/10/2023   Yearly kidney function blood test for diabetes  06/24/2023   Complete foot exam   11/07/2023   Medicare Annual Wellness Visit  12/09/2023   Colon Cancer Screening  08/26/2027   HPV Vaccine  Aged Out  *Topic was postponed. The date shown is not the original due date.    Advanced directives: In Chart  Conditions/risks identified: None  Next appointment: Follow up in one year for your annual wellness visit.   Preventive Care 26 Years and Older, Male  Preventive care refers to lifestyle choices and visits with your health care provider that can promote health and wellness. What does preventive care include? A yearly physical exam. This is also called an annual well check. Dental exams once or twice a year. Routine eye exams. Ask your health care provider how often you should have your eyes checked. Personal lifestyle choices, including: Daily care of your teeth and gums. Regular  physical activity. Eating a healthy diet. Avoiding tobacco and drug use. Limiting alcohol use. Practicing safe sex. Taking low doses of aspirin every day. Taking vitamin and mineral supplements as recommended by your health care provider. What happens during an annual well check? The services and screenings done by your health care provider during your annual well check will depend on your age, overall health, lifestyle risk factors, and family history of disease. Counseling  Your health care provider may ask you questions about your: Alcohol use. Tobacco use. Drug use. Emotional well-being. Home and relationship well-being. Sexual activity. Eating habits. History of falls. Memory and ability to understand (cognition). Work and work Astronomer. Screening  You may have the following tests or measurements: Height, weight, and BMI. Blood pressure. Lipid and cholesterol levels. These may be checked every 5 years, or more frequently if you are over 63 years old. Skin check. Lung cancer screening. You may have this screening every year starting at age 64 if you have a 30-pack-year history of smoking and currently smoke or have quit within the past 15 years. Fecal occult blood test (FOBT) of the stool. You may have this test every year starting at age 50. Flexible sigmoidoscopy or colonoscopy. You may have a sigmoidoscopy every 5 years or a colonoscopy every 10  years starting at age 39. Prostate cancer screening. Recommendations will vary depending on your family history and other risks. Hepatitis C blood test. Hepatitis B blood test. Sexually transmitted disease (STD) testing. Diabetes screening. This is done by checking your blood sugar (glucose) after you have not eaten for a while (fasting). You may have this done every 1-3 years. Abdominal aortic aneurysm (AAA) screening. You may need this if you are a current or former smoker. Osteoporosis. You may be screened starting at age 41  if you are at high risk. Talk with your health care provider about your test results, treatment options, and if necessary, the need for more tests. Vaccines  Your health care provider may recommend certain vaccines, such as: Influenza vaccine. This is recommended every year. Tetanus, diphtheria, and acellular pertussis (Tdap, Td) vaccine. You may need a Td booster every 10 years. Zoster vaccine. You may need this after age 52. Pneumococcal 13-valent conjugate (PCV13) vaccine. One dose is recommended after age 88. Pneumococcal polysaccharide (PPSV23) vaccine. One dose is recommended after age 77. Talk to your health care provider about which screenings and vaccines you need and how often you need them. This information is not intended to replace advice given to you by your health care provider. Make sure you discuss any questions you have with your health care provider. Document Released: 08/28/2015 Document Revised: 04/20/2016 Document Reviewed: 06/02/2015 Elsevier Interactive Patient Education  2017 [employer].  Fall Prevention in the Home Falls can cause injuries. They can happen to people of all ages. There are many things you can do to make your home safe and to help prevent falls. What can I do on the outside of my home? Regularly fix the edges of walkways and driveways and fix any cracks. Remove anything that might make you trip as you walk through a door, such as a raised step or threshold. Trim any bushes or trees on the path to your home. Use bright outdoor lighting. Clear any walking paths of anything that might make someone trip, such as rocks or tools. Regularly check to see if handrails are loose or broken. Make sure that both sides of any steps have handrails. Any raised decks and porches should have guardrails on the edges. Have any leaves, snow, or ice cleared regularly. Use sand or salt on walking paths during winter. Clean up any spills in your garage right away. This  includes oil or grease spills. What can I do in the bathroom? Use night lights. Install grab bars by the toilet and in the tub and shower. Do not use towel bars as grab bars. Use non-skid mats or decals in the tub or shower. If you need to sit down in the shower, use a plastic, non-slip stool. Keep the floor dry. Clean up any water that spills on the floor as soon as it happens. Remove soap buildup in the tub or shower regularly. Attach bath mats securely with double-sided non-slip rug tape. Do not have throw rugs and other things on the floor that can make you trip. What can I do in the bedroom? Use night lights. Make sure that you have a light by your bed that is easy to reach. Do not use any sheets or blankets that are too big for your bed. They should not hang down onto the floor. Have a firm chair that has side arms. You can use this for support while you get dressed. Do not have throw rugs and other things on the floor  that can make you trip. What can I do in the kitchen? Clean up any spills right away. Avoid walking on wet floors. Keep items that you use a lot in easy-to-reach places. If you need to reach something above you, use a strong step stool that has a grab bar. Keep electrical cords out of the way. Do not use floor polish or wax that makes floors slippery. If you must use wax, use non-skid floor wax. Do not have throw rugs and other things on the floor that can make you trip. What can I do with my stairs? Do not leave any items on the stairs. Make sure that there are handrails on both sides of the stairs and use them. Fix handrails that are broken or loose. Make sure that handrails are as long as the stairways. Check any carpeting to make sure that it is firmly attached to the stairs. Fix any carpet that is loose or worn. Avoid having throw rugs at the top or bottom of the stairs. If you do have throw rugs, attach them to the floor with carpet tape. Make sure that you  have a light switch at the top of the stairs and the bottom of the stairs. If you do not have them, ask someone to add them for you. What else can I do to help prevent falls? Wear shoes that: Do not have high heels. Have rubber bottoms. Are comfortable and fit you well. Are closed at the toe. Do not wear sandals. If you use a stepladder: Make sure that it is fully opened. Do not climb a closed stepladder. Make sure that both sides of the stepladder are locked into place. Ask someone to hold it for you, if possible. Clearly mark and make sure that you can see: Any grab bars or handrails. First and last steps. Where the edge of each step is. Use tools that help you move around (mobility aids) if they are needed. These include: Canes. Walkers. Scooters. Crutches. Turn on the lights when you go into a dark area. Replace any light bulbs as soon as they burn out. Set up your furniture so you have a clear path. Avoid moving your furniture around. If any of your floors are uneven, fix them. If there are any pets around you, be aware of where they are. Review your medicines with your doctor. Some medicines can make you feel dizzy. This can increase your chance of falling. Ask your doctor what other things that you can do to help prevent falls. This information is not intended to replace advice given to you by your health care provider. Make sure you discuss any questions you have with your health care provider. Document Released: 05/28/2009 Document Revised: 01/07/2016 Document Reviewed: 09/05/2014 Elsevier Interactive Patient Education  2017 [employer].

## 2022-12-09 NOTE — Progress Notes (Signed)
Subjective:   Jermaine Zobrist. is a 67 y.o. male who presents for Medicare Annual/Subsequent preventive examination.  Review of Systems    Virtual Visit via Telephone Note  I connected with  Jermaine James. on 12/09/22 at 11:15 AM EDT by telephone and verified that I am speaking with the correct person using two identifiers.  Location: Patient: Home Provider: Office Persons participating in the virtual visit: patient/Nurse Health Advisor   I discussed the limitations, risks, security and privacy concerns of performing an evaluation and management service by telephone and the availability of in person appointments. The patient expressed understanding and agreed to proceed.  Interactive audio and video telecommunications were attempted between this nurse and patient, however failed, due to patient having technical difficulties OR patient did not have access to video capability.  We continued and completed visit with audio only.  Some vital signs may be absent or patient reported.   Tillie Rung, LPN  Cardiac Risk Factors include: advanced age (>49men, >43 women);hypertension;male gender;diabetes mellitus     Objective:    Today's Vitals   12/09/22 1122  Weight: 284 lb 3.2 oz (128.9 kg)  Height: 5\' 11"  (1.803 m)   Body mass index is 39.64 kg/m.     12/09/2022   11:32 AM 08/25/2022    9:45 AM 12/07/2021    9:53 AM 11/10/2020    3:27 PM 12/18/2019    2:57 PM 11/21/2019    3:15 PM 01/30/2019    9:18 AM  Advanced Directives  Does Patient Have a Medical Advance Directive? Yes Yes Yes Yes Yes Yes Yes  Type of Estate agent of [place];Living will Healthcare Power of [employer] of [place];Living will Healthcare Power of [employer] of [place];Living will  Healthcare Power of [place];Living will  Does patient want to make changes to medical advance directive? No - Patient declined      No - Patient declined  Copy of  Healthcare Power of Attorney in Chart? Yes - validated most recent copy scanned in chart (See row information)  Yes - validated most recent copy scanned in chart (See row information) No - copy requested   No - copy requested  Would patient like information on creating a medical advance directive?     No - Patient declined      Current Medications (verified) Outpatient Encounter Medications as of 12/09/2022  Medication Sig   fenofibrate 160 MG tablet Take 1 tablet (160 mg total) by mouth daily.   lisinopril-hydrochlorothiazide (ZESTORETIC) 20-25 MG tablet TAKE 1 TABLET BY MOUTH DAILY   metFORMIN (GLUCOPHAGE) 1000 MG tablet Take 1 tablet (1,000 mg total) by mouth 2 (two) times daily with a meal.   [DISCONTINUED] testosterone cypionate (DEPOTESTOSTERONE CYPIONATE) 200 MG/ML injection Inject 1 mL (200 mg total) into the muscle every 7 (seven) days.   Facility-Administered Encounter Medications as of 12/09/2022  Medication   testosterone cypionate (DEPOTESTOSTERONE CYPIONATE) injection 200 mg   testosterone cypionate (DEPOTESTOSTERONE CYPIONATE) injection 200 mg   testosterone cypionate (DEPOTESTOSTERONE CYPIONATE) injection 200 mg   testosterone cypionate (DEPOTESTOSTERONE CYPIONATE) injection 200 mg   testosterone cypionate (DEPOTESTOSTERONE CYPIONATE) injection 200 mg   testosterone cypionate (DEPOTESTOSTERONE CYPIONATE) injection 200 mg   testosterone cypionate (DEPOTESTOSTERONE CYPIONATE) injection 200 mg    Allergies (verified) Patient has no known allergies.   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 of post-op N/V   High triglycerides    no current med.   History of gout    History of kidney stones    Hypertension    states under control with med., has been  on med. ~ 20 yr.   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 Rhea Belton, adenomatous polyps, repeat in 5 yrs   COLONOSCOPY WITH PROPOFOL N/A 08/25/2022   Procedure: COLONOSCOPY WITH PROPOFOL;  Surgeon: Napoleon Form, MD;  Location: [facility];  Service: Gastroenterology;  Laterality: N/A;   CYSTOSCOPY  2011   "related to trace blood in urine"   EYE SURGERY Left 1961   traumatic loss of eye   INGUINAL HERNIA REPAIR Left    KNEE ARTHROSCOPY Left 1990s?   KNEE ARTHROSCOPY WITH MEDIAL MENISECTOMY Right 11/08/2012   Procedure: RIGHT KNEE ARTHROSCOPY WITH PARTIAL MEDIAL AND LATERAL MENISECTOMIES, CHONDROPLASTY;  Surgeon: Loreta Ave, MD;  Location: [facility];  Service: Orthopedics;  Laterality: Right;  RIGHT KNEE SCOPE WITH PARTIAL MEDIAL AND LATERAL  MENISCECTOMIES  AND CHONDROPLASTY   KNEE CLOSED REDUCTION Left 08/18/2016   Procedure: LEFT  MANIPULATION KNEE;  Surgeon: Loreta Ave, MD;  Location: [facility];  Service: Orthopedics;  Laterality: Left;   LIPOMA EXCISION Left    upper back   MOHS SURGERY Left 10/2015   "forehead"   RETINAL TEAR REPAIR CRYOTHERAPY Right 11/2017   SPINE SURGERY  02/07/2019   TOTAL KNEE ARTHROPLASTY Left 05/18/2016   Procedure: LEFT TOTAL KNEE ARTHROPLASTY;  Surgeon: Loreta Ave, MD;  Location[facility];  Service: Orthopedics;  Laterality: Left;   TOTAL KNEE ARTHROPLASTY Right 09/14/2016   Procedure: TOTAL KNEE ARTHROPLASTY MANIPULATION LEFT KNEE;  Surgeon: Loreta Ave, MD;  Location: [facility];  Service: Orthopedics;  Laterality: Right;   Family History  Problem Relation Age of  Onset   Anesthesia problems Father        post-op N/V   Colon cancer Neg Hx    Esophageal cancer Neg Hx    Rectal cancer Neg Hx    Stomach cancer Neg Hx    Social History   Socioeconomic History   Marital status: Married    Spouse name: Not on file   Number of children: Not on file   Years of education: Not on file   Highest education level: Bachelor's degree (e.g., BA, AB, BS)  Occupational History   Occupation: Psychologist, occupational    Comment: retired  Tobacco Use   Smoking status: Never   Smokeless tobacco: Never  Vaping Use   Vaping Use: Never used  Substance and Sexual Activity   Alcohol use: Yes    Alcohol/week: 3.0 - 4.0 standard drinks of alcohol    Types: 1 Glasses of wine, 1 Cans of beer, 1 - 2 Standard drinks or equivalent per week   Drug use: No   Sexual  activity: Yes  Other Topics Concern   Not on file  Social History Narrative   Not on file   Social Determinants of Health   Financial Resource Strain: Low Risk  (12/09/2022)   Overall Financial Resource Strain (CARDIA)    Difficulty of Paying Living Expenses: Not hard at all  Food Insecurity: No Food Insecurity (12/09/2022)   Hunger Vital Sign    Worried About Running Out of Food in the Last Year: Never true    Ran Out of Food in the Last Year: Never true  Transportation Needs: No Transportation Needs (12/09/2022)   PRAPARE - Administrator, Civil Service (Medical): No    Lack of Transportation (Non-Medical): No  Physical Activity: Sufficiently Active (12/09/2022)   Exercise Vital Sign    Days of Exercise per Week: 3 days    Minutes of Exercise per Session: 50 min  Stress: No Stress Concern Present (12/09/2022)   [employer] of Occupational Health - Occupational Stress Questionnaire    Feeling of Stress : Not at all  Social Connections: Socially Integrated (12/09/2022)   Social Connection and Isolation Panel [NHANES]    Frequency of Communication with Friends and Family: More than three times a week     Frequency of Social Gatherings with Friends and Family: More than three times a week    Attends Religious Services: More than 4 times per year    Active Member of [employer] or Organizations: Yes    Attends Engineer, structural: More than 4 times per year    Marital Status: Married    Tobacco Counseling Counseling given: Not Answered   Clinical Intake:  Pre-visit preparation completed: Yes  Pain : No/denies pain Nutrition Risk Assessment:  Has the patient had any N/V/D within the last 2 months?  No  Does the patient have any non-healing wounds?  No  Has the patient had any unintentional weight loss or weight gain?  No   Diabetes:  Is the patient diabetic?  Yes  If diabetic, was a CBG obtained today?  No  Did the patient bring in their glucometer from home?  No  How often do you monitor your CBG's? Daily.   Financial Strains and Diabetes Management:  Are you having any financial strains with the device, your supplies or your medication? No .  Does the patient want to be seen by Chronic Care Management for management of their diabetes?  No  Would the patient like to be referred to a Nutritionist or for Diabetic Management?  No   Diabetic Exams:  Diabetic Eye Exam: Completed . Overdue for diabetic eye exam. Pt has been advised about the importance in completing this exam. A referral has been placed today. Message sent to referral coordinator for scheduling purposes. Advised pt to expect a call from office referred to regarding appt.  Diabetic Foot Exam: Completed . Pt has been advised about the importance in completing this exam. Pt is scheduled for diabetic foot exam on Followed by Dr Elvera Lennox.      BMI - recorded: 39.64 Nutritional Status: BMI > 30  Obese Nutritional Risks: None Diabetes: Yes CBG done?: No Did pt. bring in CBG monitor from home?: No  How often do you need to have someone help you when you read instructions, pamphlets, or other written materials  from your doctor or pharmacy?: 1 - Never  Diabetic?  Yes  Interpreter Needed?: No . Information entered by :: Theresa Mulligan LPN   Activities of Daily  Living    12/09/2022   11:28 AM 12/08/2022   11:52 AM  In your present state of health, do you have any difficulty performing the following activities:  Hearing? 0 0  Vision? 0 0  Difficulty concentrating or making decisions? 0 0  Walking or climbing stairs? 0 0  Dressing or bathing? 0 0  Doing errands, shopping? 0 0  Preparing Food and eating ? N N  Using the Toilet? N N  In the past six months, have you accidently leaked urine? N N  Do you have problems with loss of bowel control? N N  Managing your Medications? N N  Managing your Finances? N N  Housekeeping or managing your Housekeeping? N N    Patient Care Team: Nelwyn Salisbury, MD as PCP - General  Indicate any recent Medical Services you may have received from other than [facility] providers in the past year (date may be approximate).     Assessment:   This is a routine wellness examination for [place].  Hearing/Vision screen Hearing Screening - Comments:: Denies hearing difficulties   Vision Screening - Comments:: Wears rx glasses - up to date with routine eye exams with  Velna Ochs  Dietary issues and exercise activities discussed: Current Exercise Habits: Home exercise routine, Type of exercise: walking, Time (Minutes): 50, Frequency (Times/Week): 3, Weekly Exercise (Minutes/Week): 150, Intensity: Moderate, Exercise limited by: None identified   Goals Addressed               This Visit's Progress     Patient stated (pt-stated)        I want to continue to lose weight and lower my A1C.       Depression Screen    12/09/2022   11:26 AM 12/07/2021   11:02 AM 12/07/2021    9:54 AM 11/10/2020    3:26 PM 11/21/2019    3:15 PM 10/07/2014    4:56 PM  PHQ 2/9 Scores  PHQ - 2 Score 0 0 0 0 0 0  PHQ- 9 Score  0        Fall Risk    12/09/2022   11:29 AM 12/08/2022    11:52 AM 12/07/2021   11:02 AM 12/07/2021    9:54 AM 12/07/2021    9:06 AM  Fall Risk   Falls in the past year? 0 0 0 0 0  Number falls in past yr: 0 0 0 0   Injury with Fall? 0 0 0 0   Risk for fall due to : No Fall Risks  No Fall Risks Medication side effect   Follow up Falls prevention discussed  Falls evaluation completed Falls evaluation completed;Education provided;Falls prevention discussed     FALL RISK PREVENTION PERTAINING TO THE HOME:  Any stairs in or around the home? Yes  If so, are there any without handrails? No  Home free of loose throw rugs in walkways, pet beds, electrical cords, etc? Yes  Adequate lighting in your home to reduce risk of falls? Yes   ASSISTIVE DEVICES UTILIZED TO PREVENT FALLS:  Life alert? No  Use of a cane, walker or w/c? No  Grab bars in the bathroom? No  Shower chair or bench in shower? Yes  Elevated toilet seat or a handicapped toilet? Yes   TIMED UP AND GO:  Was the test performed? No . Audio Visit   Cognitive Function:        12/09/2022   11:32 AM 12/07/2021    9:55 AM  11/10/2020    3:29 PM  6CIT Screen  What Year? 0 points 0 points 0 points  What month? 0 points 0 points 0 points  What time? 0 points 0 points   Count back from 20 0 points 0 points 0 points  Months in reverse 0 points 0 points 0 points  Repeat phrase 0 points 0 points 0 points  Total Score 0 points 0 points     Immunizations Immunization History  Administered Date(s) Administered   Fluad Quad(high Dose 65+) 05/27/2020, 07/27/2021   PFIZER Comirnaty(Gray Top)Covid-19 Tri-Sucrose Vaccine 09/03/2019, 09/24/2019, 08/03/2020   Td 10/28/2009    TDAP status: Due, Education has been provided regarding the importance of this vaccine. Advised may receive this vaccine at local pharmacy or Health Dept. Aware to provide a copy of the vaccination record if obtained from local pharmacy or Health Dept. Verbalized acceptance and understanding.  Flu Vaccine status: Up  to date  Pneumococcal vaccine status: Due, Education has been provided regarding the importance of this vaccine. Advised may receive this vaccine at local pharmacy or Health Dept. Aware to provide a copy of the vaccination record if obtained from local pharmacy or Health Dept. Verbalized acceptance and understanding.  Covid-19 vaccine status: Completed vaccines  Qualifies for Shingles Vaccine? Yes   Zostavax completed No   Shingrix Completed?: No.    Education has been provided regarding the importance of this vaccine. Patient has been advised to call insurance company to determine out of pocket expense if they have not yet received this vaccine. Advised may also receive vaccine at local pharmacy or Health Dept. Verbalized acceptance and understanding.  Screening Tests Health Maintenance  Topic Date Due   DTaP/Tdap/Td (2 - Tdap) 10/29/2019   OPHTHALMOLOGY EXAM  12/09/2022 (Originally 09/18/2021)   Diabetic kidney evaluation - Urine ACR  12/10/2022 (Originally 09/29/2021)   COVID-19 Vaccine (4 - 2023-24 season) 12/25/2022 (Originally 04/15/2022)   Zoster Vaccines- Shingrix (1 of 2) 03/10/2023 (Originally 05/13/2004)   Pneumonia Vaccine 6+ Years old (1 of 1 - PCV) 12/09/2023 (Originally 05/14/2019)   Hepatitis C Screening  12/09/2023 (Originally 05/13/1974)   INFLUENZA VACCINE  03/16/2023   HEMOGLOBIN A1C  05/10/2023   Diabetic kidney evaluation - eGFR measurement  06/24/2023   FOOT EXAM  11/07/2023   Medicare Annual Wellness (AWV)  12/09/2023   COLONOSCOPY (Pts 45-42yrs Insurance coverage will need to be confirmed)  08/26/2027   HPV VACCINES  Aged Out    Health Maintenance  Health Maintenance Due  Topic Date Due   DTaP/Tdap/Td (2 - Tdap) 10/29/2019    Colorectal cancer screening: Type of screening: Colonoscopy. Completed 08/25/22. Repeat every 5 years  Lung Cancer Screening: (Low Dose CT Chest recommended if Age 22-80 years, 30 pack-year currently smoking OR have quit w/in 15years.) does  not qualify.     Additional Screening:  Hepatitis C Screening: does qualify;  Deferred  Vision Screening: Recommended annual ophthalmology exams for early detection of glaucoma and other disorders of the eye. Is the patient up to date with their annual eye exam?  Yes  Who is the provider or what is the name of the office in which the patient ats annual eye exams? [facility] If pt is not established with a provider, would they like to be referred to a provider to establish care? No .   Dental Screening: Recommended annual dental exams for proper oral hygiene  Community Resource Referral / Chronic Care Management:  CRR required this visit?  No  CCM required this visit?  No      Plan:     I have personally reviewed and noted the following in the patient's chart:   Medical and social history Use of alcohol, tobacco or illicit drugs  Current medications and supplements including opioid prescriptions. Patient is not currently taking opioid prescriptions. Functional ability and status Nutritional status Physical activity Advanced directives List of other physicians Hospitalizations, surgeries, and ER visits in previous 12 months Vitals Screenings to include cognitive, depression, and falls Referrals and appointments  In addition, I have reviewed and discussed with patient certain preventive protocols, quality metrics, and best practice recommendations. A written personalized care plan for preventive services as well as general preventive health recommendations were provided to patient.     Tillie Rung, LPN   9/62/[id number]   Nurse Notes: Patient due Hep-C Screening and Diabetic kidney evaluation-Urine ACR

## 2022-12-19 ENCOUNTER — Other Ambulatory Visit: Payer: Medicare Other

## 2023-01-03 ENCOUNTER — Ambulatory Visit (INDEPENDENT_AMBULATORY_CARE_PROVIDER_SITE_OTHER): Payer: Medicare Other | Admitting: Family Medicine

## 2023-01-03 ENCOUNTER — Encounter: Payer: Self-pay | Admitting: Family Medicine

## 2023-01-03 VITALS — BP 118/70 | HR 80 | Temp 98.5°F | Wt 284.0 lb

## 2023-01-03 DIAGNOSIS — N451 Epididymitis: Secondary | ICD-10-CM

## 2023-01-03 MED ORDER — CIPROFLOXACIN HCL 500 MG PO TABS: 500.0000 mg | ORAL_TABLET | Freq: Two times a day (BID) | ORAL | 0 refills | Status: AC

## 2023-01-03 NOTE — Progress Notes (Signed)
   Subjective:    Patient ID: Jermaine James., male    DOB: [birth year], 67 y.o.   MRN: [id number]  HPI Here for 5 days of pain in the left testicle and the left groin. No urinary symptoms or fever. No recent trauma. Drinking lots of water and taking Ibuprofen.    Review of Systems  Constitutional: Negative.   Respiratory: Negative.    Cardiovascular: Negative.   Gastrointestinal: Negative.   Genitourinary:  Positive for testicular pain. Negative for difficulty urinating, dysuria, flank pain, frequency and hematuria.       Objective:   Physical Exam Constitutional:      Appearance: Normal appearance.  Cardiovascular:     Rate and Rhythm: Normal rate and regular rhythm.     Pulses: Normal pulses.     Heart sounds: Normal heart sounds.  Pulmonary:     Effort: Pulmonary effort is normal.     Breath sounds: Normal breath sounds.  Genitourinary:    Comments: The left epididymis is tender, but not the testicle. No hernias are felt  Neurological:     Mental Status: He is alert.           Assessment & Plan:  Epididymitis, treat with 10 days of Cipro. Gershon Crane, MD

## 2023-01-13 ENCOUNTER — Encounter: Payer: Self-pay | Admitting: Family Medicine

## 2023-01-17 ENCOUNTER — Other Ambulatory Visit: Payer: Self-pay

## 2023-01-17 MED ORDER — DOXYCYCLINE HYCLATE 100 MG PO TABS: ORAL_TABLET | ORAL | 0 refills | Status: DC

## 2023-01-17 NOTE — Telephone Encounter (Signed)
Call in Doxycycline 100 mg BID for 14 days  

## 2023-01-19 ENCOUNTER — Other Ambulatory Visit: Payer: Self-pay | Admitting: Family Medicine

## 2023-01-30 ENCOUNTER — Telehealth: Payer: Self-pay | Admitting: Family Medicine

## 2023-01-30 DIAGNOSIS — N451 Epididymitis: Secondary | ICD-10-CM

## 2023-01-30 NOTE — Telephone Encounter (Signed)
Requesting referral to urology for epididymitis, is coming to end of his current treatment and doesn't think the treatment is working. Requesting a call

## 2023-01-31 NOTE — Telephone Encounter (Signed)
He need the referral for epididymitis

## 2023-01-31 NOTE — Telephone Encounter (Signed)
Left pt a message to call the office back.

## 2023-01-31 NOTE — Telephone Encounter (Signed)
Please call him to get more information.

## 2023-01-31 NOTE — Telephone Encounter (Signed)
I did an urgent referral to Urology

## 2023-01-31 NOTE — Addendum Note (Signed)
Addended by: Gershon Crane A on: 01/31/2023 04:37 PM   Modules accepted: Orders

## 2023-01-31 NOTE — Telephone Encounter (Signed)
Pt states that he has not felt better from his last visit, pt states that even after taking 2 different antibiotic Dr Clent Ridges prescribed he feels like the pain is getting worse and radiating from his left testicle to his groin.

## 2023-02-01 NOTE — Telephone Encounter (Signed)
Spoke with pt stated that he already been scheduled for Urology appointment for 02/03/23

## 2023-02-27 ENCOUNTER — Encounter: Payer: Self-pay | Admitting: Family Medicine

## 2023-02-27 ENCOUNTER — Ambulatory Visit: Payer: Medicare Other | Admitting: Family Medicine

## 2023-02-27 VITALS — BP 120/72 | HR 70 | Temp 98.2°F | Wt 291.0 lb

## 2023-02-27 DIAGNOSIS — E785 Hyperlipidemia, unspecified: Secondary | ICD-10-CM | POA: Diagnosis not present

## 2023-02-27 DIAGNOSIS — R972 Elevated prostate specific antigen [PSA]: Secondary | ICD-10-CM | POA: Diagnosis not present

## 2023-02-27 LAB — TSH: TSH: 1.9 u[IU]/mL (ref 0.35–5.50)

## 2023-02-27 LAB — HEPATIC FUNCTION PANEL
ALT: 26 U/L (ref 0–53)
AST: 23 U/L (ref 0–37)
Albumin: 4.5 g/dL (ref 3.5–5.2)
Alkaline Phosphatase: 35 U/L — ABNORMAL LOW (ref 39–117)
Bilirubin, Direct: 0.2 mg/dL (ref 0.0–0.3)
Total Bilirubin: 0.6 mg/dL (ref 0.2–1.2)
Total Protein: 7.5 g/dL (ref 6.0–8.3)

## 2023-02-27 LAB — PSA: PSA: 0.09 ng/mL — ABNORMAL LOW (ref 0.10–4.00)

## 2023-02-27 LAB — LIPID PANEL
Cholesterol: 142 mg/dL (ref 0–200)
HDL: 53.4 mg/dL (ref >39.00)
LDL Cholesterol: 66 mg/dL (ref 0–99)
NonHDL: 88.48
Total CHOL/HDL Ratio: 3
Triglycerides: 111 mg/dL (ref 0.0–149.0)
VLDL: 22.2 mg/dL (ref 0.0–40.0)

## 2023-02-27 MED ORDER — FENOFIBRATE 160 MG PO TABS: 160.0000 mg | ORAL_TABLET | Freq: Every day | ORAL | 3 refills | Status: DC

## 2023-02-27 NOTE — Progress Notes (Signed)
   Subjective:    Patient ID: [name], male    DOB: [birth year], 67 y.o.   MRN: [id number]  HPI Here to follow up on dyslipidemia and other issues. He feels well. His last A1c in March was 5.8%. his BP has been stable.    Review of Systems  Constitutional: Negative.   Respiratory: Negative.    Cardiovascular: Negative.        Objective:   Physical Exam Constitutional:      Appearance: He is obese. He is not ill-appearing.  Cardiovascular:     Rate and Rhythm: Normal rate and regular rhythm.     Pulses: Normal pulses.     Heart sounds: Normal heart sounds.  Pulmonary:     Effort: Pulmonary effort is normal.     Breath sounds: Normal breath sounds.  Neurological:     Mental Status: He is alert.           Assessment & Plan:  His HTN and diabetes are stable. We will get fasting labs to check lipids, PSA, etc.  Gershon Crane, MD

## 2023-03-24 ENCOUNTER — Encounter: Payer: Self-pay | Admitting: Family Medicine

## 2023-04-10 DIAGNOSIS — G4733 Obstructive sleep apnea (adult) (pediatric): Secondary | ICD-10-CM

## 2023-05-10 ENCOUNTER — Encounter: Payer: Self-pay | Admitting: Internal Medicine

## 2023-05-10 ENCOUNTER — Ambulatory Visit (INDEPENDENT_AMBULATORY_CARE_PROVIDER_SITE_OTHER): Payer: Medicare Other | Admitting: Internal Medicine

## 2023-05-10 VITALS — BP 138/80 | HR 84 | Resp 20 | Ht 71.0 in | Wt 299.4 lb

## 2023-05-10 DIAGNOSIS — E785 Hyperlipidemia, unspecified: Secondary | ICD-10-CM | POA: Diagnosis not present

## 2023-05-10 DIAGNOSIS — E1165 Type 2 diabetes mellitus with hyperglycemia: Secondary | ICD-10-CM | POA: Diagnosis not present

## 2023-05-10 DIAGNOSIS — Z7984 Long term (current) use of oral hypoglycemic drugs: Secondary | ICD-10-CM | POA: Diagnosis not present

## 2023-05-10 LAB — POCT GLYCOSYLATED HEMOGLOBIN (HGB A1C): Hemoglobin A1C: 5.6 % (ref 4.0–5.6)

## 2023-05-10 LAB — MICROALBUMIN / CREATININE URINE RATIO
Creatinine,U: 74 mg/dL
Microalb Creat Ratio: 1.3 mg/g (ref 0.0–30.0)
Microalb, Ur: 1 mg/dL (ref 0.0–1.9)

## 2023-05-10 NOTE — Patient Instructions (Addendum)
Please continue: - Metformin 1000 mg 2x a day with meals  STOP SODAS!  Please return in 6 months with your sugar log.

## 2023-05-10 NOTE — Progress Notes (Signed)
Patient ID: Jermaine Danker., male   DOB: [birth year], 67 y.o.   MRN: [id number]   HPI: Jermaine Longaker. is a 67 y.o.-year-old male, initially referred by his PCP, Dr. Clent Ridges, returning for follow-up for DM2, dx in 2012, non-insulin-dependent, now controlled, without long-term complications. He is the husband of Jermaine James, who is also my pt.  Last visit 6 months ago.  Interim history: No increased urination, blurry vision, nausea, chest pain. He is still drinking regular sodas - still 1-2 x a week.  Reviewed HbA1c level: Lab Results  Component Value Date   HGBA1C 5.8 (A) 11/07/2022   HGBA1C 6.1 (A) 06/03/2022   HGBA1C 6.7 (H) 12/07/2021   HGBA1C 6.4 (A) 07/27/2021   HGBA1C 6.1 (A) 02/16/2021   HGBA1C 6.5 (A) 09/29/2020   HGBA1C 6.7 (A) 05/28/2020   HGBA1C 6.2 (A) 01/22/2020   HGBA1C 7.6 (A) 10/18/2019   HGBA1C 8.3 (H) 07/18/2019   HGBA1C 7.6 (H) 01/30/2019   HGBA1C 6.8 (H) 02/01/2018   HGBA1C 7.0 (H) 09/02/2016   HGBA1C 6.5 04/20/2016   HGBA1C 6.1 05/12/2015   HGBA1C 6.5 01/16/2014   HGBA1C 7.0 (H) 08/20/2012   HGBA1C 6.5 (H) 07/02/2012   HGBA1C 6.6 (H) 07/05/2011   HGBA1C 6.4 06/23/2010   HGBA1C 6.4 10/30/2009   HGBA1C 6.4 07/24/2009   HGBA1C 6.3 (H) 04/20/2007   Pt is on a regimen of: - Metformin 500 mg 2x a day, with meals >> 1000 mg twice a day  He checks sugars 0 to 1x a day: - am: 116-125, 137 >> 101-128 >> 106-128 >> 105-112 >> 102, 109-114, 130 - 2h after b'fast: 115-138, 177 >> 121-147 >> n/c>> 109-122 >> 112-120  - before lunch: 118-140 >> 125-147 >> 107-128>> 109-122 >> 109-125 - 2h after lunch: 128-140 >> 126-165 >> 122 >> 121-140 >> 126-139 - before dinner: 116-132, 165 >> 137, 144 >> 115-136>> 115-128 >> 128-143 - 2h after dinner: 144-197 >> 149-161, 168 >> 125-138 >> 146-158 - bedtime:  119-213, 239 >> 139-168, 217 >> n/c - nighttime: n/c Lowest sugar was 98 ... >> 103>> 105 >> 102 Highest sugar was 239 (pizza, Soda) ... >> 168>> 140 >>  162  Glucometer: none >> True Focus  Pt's meals are: - Breakfast: cheerios + 1-2% milk; pancake + waffle + bacon; - Lunch: sandwich - PB/turkey/nan - Dinner: meat + starch + veggies - Snacks: 1-2x a day -  -No CKD, last BUN/creatinine:  Lab Results  Component Value Date   BUN 21 06/23/2022   BUN 15 12/07/2021   CREATININE 1.25 06/23/2022   CREATININE 0.85 12/07/2021   No MAU: Lab Results  Component Value Date   MICRALBCREAT 0.8 09/29/2020   MICRALBCREAT 1.7 01/22/2020   MICRALBCREAT 1.0 05/12/2015   MICRALBCREAT 0.7 08/20/2012   MICRALBCREAT 0.3 07/05/2011   MICRALBCREAT 4.3 07/24/2009   MICRALBCREAT 4.3 04/20/2007  On lisinopril 20.  -+ HL; last set of lipids: Lab Results  Component Value Date   CHOL 142 02/27/2023   HDL 53.40 02/27/2023   LDLCALC 66 02/27/2023   LDLDIRECT 86.0 02/01/2018   TRIG 111.0 02/27/2023   CHOLHDL 3 02/27/2023  On fenofibrate 160. He refused statins.  - last eye exam was 12/27/2022: No DR reportedly. Artificial eye OS.  + Cataract OD >> had sx. He had retinal tears >> saw Dr. Allyne Gee (retina) 04/26/2023.  - no numbness and tingling in his feet. Last foot exam 11/07/2022.  He sees podiatry on an as-needed basis.  Has Achilles tendonitis >> improved. Wears shoe inserts.  Pt has no FH of DM.  He also has a history of OSA-on CPAP, gout, HTN. He also has a history of hypogonadism.  He was on testosterone replacement, but had side effects: rage, irritability, paranoia - stopped >> restarted by urology.   ROS: + see HPI  I reviewed pt's medications, allergies, PMH, social hx, family hx, and changes were documented in the history of present illness. Otherwise, unchanged from my initial visit note.  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 of post-op N/V   High triglycerides    no current med.   History of gout    History of kidney stones    Hypertension    states under control with med., has been on med. ~ 20 yr.   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 Rhea Belton, adenomatous polyps, repeat in 5 yrs   COLONOSCOPY WITH PROPOFOL N/A 08/25/2022   Procedure: COLONOSCOPY WITH PROPOFOL;  Surgeon: Napoleon Form, MD;  Location: [facility];  Service: Gastroenterology;  Laterality: N/A;   CYSTOSCOPY  2011   "related to trace blood in urine"   EYE SURGERY Left 1961   traumatic loss of eye   INGUINAL HERNIA REPAIR Left    KNEE ARTHROSCOPY Left 1990s?   KNEE ARTHROSCOPY WITH MEDIAL MENISECTOMY Right 11/08/2012   Procedure: RIGHT KNEE ARTHROSCOPY WITH PARTIAL MEDIAL AND LATERAL MENISECTOMIES, CHONDROPLASTY;  Surgeon: Loreta Ave, MD;  Location: [facility];  Service: Orthopedics;  Laterality: Right;  RIGHT KNEE SCOPE WITH PARTIAL MEDIAL AND LATERAL  MENISCECTOMIES  AND CHONDROPLASTY   KNEE CLOSED REDUCTION Left 08/18/2016   Procedure: LEFT  MANIPULATION KNEE;  Surgeon: Loreta Ave, MD;  Location: [facility];  Service: Orthopedics;  Laterality: Left;   LIPOMA EXCISION Left    upper back   MOHS SURGERY Left 10/2015   "forehead"   RETINAL TEAR REPAIR CRYOTHERAPY Right 11/2017   SPINE SURGERY  02/07/2019   TOTAL KNEE ARTHROPLASTY Left 05/18/2016   Procedure: LEFT TOTAL KNEE ARTHROPLASTY;  Surgeon: Loreta Ave, MD;  Location: [facility] OR;  Service: Orthopedics;  Laterality: Left;   TOTAL KNEE ARTHROPLASTY Right 09/14/2016   Procedure: TOTAL KNEE  ARTHROPLASTY MANIPULATION LEFT KNEE;  Surgeon: Loreta Ave, MD;  Location: [facility];  Service: Orthopedics;  Laterality: Right;   Social History   Socioeconomic History   Marital status: Married    Spouse name: Not on file   Number of children: 3   Years of education: Not on file   Highest education level: Not on file  Occupational History   Occupation: Psychologist, occupational -retired  Tobacco Use   Smoking status: Never Smoker   Smokeless tobacco: Never Used  Substance and Sexual Activity  Alcohol use: Yes    Alcohol/week: 3.0 - 4.0 standard drinks    Types: Beer, wine, mixed   Drug use: No   Sexual activity: Yes  Other Topics Concern   Not on file  Social History Narrative   Not on file   Social Determinants of Health   Financial Resource Strain:    Difficulty of Paying Living Expenses: Not on file  Food Insecurity:    Worried About Running Out of Food in the Last Year: Not on file   [employer] of Food in the Last Year: Not on file  Transportation Needs:    Lack of Transportation (Medical): Not on file   Lack of Transportation (Non-Medical): Not on file  Physical Activity:    Days of Exercise per Week: Not on file   Minutes of Exercise per Session: Not on file  Stress:    Feeling of Stress : Not on file  Social Connections:    Frequency of Communication with Friends and Family: Not on file   Frequency of Social Gatherings with Friends and Family: Not on file   Attends Religious Services: Not on file   Active Member of Clubs or Organizations: Not on file   Attends Banker Meetings: Not on file   Marital Status: Not on file  Intimate Partner Violence:    Fear of Current or Ex-Partner: Not on file   Emotionally Abused: Not on file   Physically Abused: Not on file   Sexually Abused: Not on file   Current Outpatient Medications on File Prior to Visit  Medication Sig Dispense Refill   fenofibrate 160 MG tablet Take 1 tablet (160 mg total) by mouth daily. 90 tablet 3    lisinopril-hydrochlorothiazide (ZESTORETIC) 20-25 MG tablet TAKE 1 TABLET BY MOUTH DAILY 90 tablet 3   metFORMIN (GLUCOPHAGE) 1000 MG tablet Take 1 tablet (1,000 mg total) by mouth 2 (two) times daily with a meal. 180 tablet 3   Current Facility-Administered Medications on File Prior to Visit  Medication Dose Route Frequency Provider Last Rate Last Admin   testosterone cypionate (DEPOTESTOSTERONE CYPIONATE) injection 200 mg  200 mg Intramuscular Weekly Nelwyn Salisbury, MD   200 mg at 09/07/22 1008   testosterone cypionate (DEPOTESTOSTERONE CYPIONATE) injection 200 mg  200 mg Intramuscular Q14 Days Nelwyn Salisbury, MD   200 mg at 08/31/22 1007   testosterone cypionate (DEPOTESTOSTERONE CYPIONATE) injection 200 mg  200 mg Intramuscular Once Nelwyn Salisbury, MD       testosterone cypionate (DEPOTESTOSTERONE CYPIONATE) injection 200 mg  200 mg Intramuscular Weekly Nelwyn Salisbury, MD   200 mg at 09/21/22 1145   testosterone cypionate (DEPOTESTOSTERONE CYPIONATE) injection 200 mg  200 mg Intramuscular Weekly Nelwyn Salisbury, MD       testosterone cypionate (DEPOTESTOSTERONE CYPIONATE) injection 200 mg  200 mg Intramuscular Weekly Deeann Saint, MD   200 mg at 10/12/22 1006   testosterone cypionate (DEPOTESTOSTERONE CYPIONATE) injection 200 mg  200 mg Intramuscular Weekly Nelwyn Salisbury, MD   200 mg at 10/26/22 1128   No Known Allergies Family History  Problem Relation Age of Onset   Anesthesia problems Father        post-op N/V   Colon cancer Neg Hx    Esophageal cancer Neg Hx    Rectal cancer Neg Hx    Stomach cancer Neg Hx    PE: BP 138/80 (BP Location: Left Arm, Patient Position: Sitting, Cuff Size: Large)   Pulse 84  Resp 20   Ht 5\' 11"  (1.803 m)   Wt 299 lb 6.4 oz (135.8 kg)   SpO2 98%   BMI 41.76 kg/m  Wt Readings from Last 10 Encounters:  05/10/23 299 lb 6.4 oz (135.8 kg)  02/27/23 291 lb (132 kg)  01/03/23 284 lb (128.8 kg)  12/09/22 284 lb 3.2 oz (128.9 kg)  11/07/22 297 lb  (134.7 kg)  10/19/22 298 lb 4 oz (135.3 kg)  09/14/22 294 lb 3.2 oz (133.4 kg)  08/26/22 289 lb (131.1 kg)  08/25/22 289 lb 14.5 oz (131.5 kg)  07/14/22 294 lb 6 oz (133.5 kg)   Constitutional: overweight, in NAD Eyes:  no exophthalmos; L eye prosthetic ENT: no neck masses, no cervical lymphadenopathy Cardiovascular: RRR, No MRG Respiratory: CTA B Musculoskeletal: no deformities Skin:no rashes Neurological: no tremor with outstretched hands  ASSESSMENT: 1. DM2, non-insulin-dependent, now controlled, without long-term complications, but with hyperglycemia  2. HL  3.  Obesity class III  PLAN:  1. Patient with longstanding previously uncontrolled type 2 diabetes, with improvement in control on metformin only in the last 3 years.  HbA1c at last visit was 5.8%, improved and blood sugars were at goal per review of his log.  We did not change his regimen but we did discuss that if the sugars continue to improve, we could reduce the dose of his metformin. -At today's visit, sugars remain well-controlled, only slightly mildly higher numbers before dinner, but otherwise at goal.  No need to change his regimen for now but I again advised him to stop sodas completely. -I suggested to: Patient Instructions  Please continue: - Metformin 1000 mg 2x a day with meals  Please return in 6 months with your sugar log.   - we checked his HbA1c: 5.6% (even lower than before) - advised to check sugars at different times of the day - 1x a day, rotating check times - advised for yearly eye exams >> he is UTD - he needs an ACR -will check this today - return to clinic in 6 months  2. HL -Reviewed latest lipid panel from 2 months ago: At goal: Lab Results  Component Value Date   CHOL 142 02/27/2023   HDL 53.40 02/27/2023   LDLCALC 66 02/27/2023   LDLDIRECT 86.0 02/01/2018   TRIG 111.0 02/27/2023   CHOLHDL 3 02/27/2023  -He is on fenofibrate 160 mg daily.  He refused a statin despite a discussion  about benefits beyond lowering cholesterol levels.  3.  Obesity class III -He continues on metformin which has an appetite suppressant effect long-term -We discussed about stopping sweet drinks at the previous visits -He lost 2 pounds before last visit and 6 more since last visit -he gained 5 lbs since last OV  Carlus Pavlov, MD PhD [facility]

## 2023-05-12 ENCOUNTER — Encounter: Payer: Self-pay | Admitting: Primary Care

## 2023-05-12 ENCOUNTER — Ambulatory Visit (INDEPENDENT_AMBULATORY_CARE_PROVIDER_SITE_OTHER): Payer: Medicare Other | Admitting: Primary Care

## 2023-05-12 VITALS — BP 142/84 | HR 86 | Wt 299.0 lb

## 2023-05-12 DIAGNOSIS — G4733 Obstructive sleep apnea (adult) (pediatric): Secondary | ICD-10-CM | POA: Diagnosis not present

## 2023-05-12 NOTE — Progress Notes (Addendum)
@Patient  ID: Jermaine Danker., male    DOB: 07-16-1956, 67 y.o.   MRN: [id number]  Chief Complaint  Patient presents with   Follow-up    Renew Rx C-PAP    Referring provider: Nelwyn Salisbury, MD  HPI: 67 year old male, never smoked.  Past medical history significant for OSA, hypertension, type 2 diabetes, dyslipidemia, increased PSA, lumbar stenosis, total knee arthroplasty.  05/12/2023 Presents today for OSA follow-up.  Patient had sleep study in 2006 that showed severe OSA, AHI 101/hr. Previous machine stopped working in August. He has been using a Industrial/product designer for last month, needs new prescribed for CPAP. Current pressure 16cm which he is tolerating. No issues with mask fit or pressure settings. No longer snoring. Denies excessive daytime sleepiness.  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 of post-op N/V   High triglycerides    no current med.   History of gout    History of kidney stones    Hypertension    states under control with med., has been on med. ~ 20 yr.   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 90 tablet 3   JATENZO 237 MG CAPS Take 1 capsule by mouth 2 (two) times daily.     lisinopril-hydrochlorothiazide (ZESTORETIC) 20-25 MG tablet TAKE 1 TABLET BY MOUTH DAILY 90 tablet 3   metFORMIN (GLUCOPHAGE) 1000 MG tablet Take 1 tablet (1,000 mg total) by mouth 2 (two) times daily with a meal. 180 tablet 3   Facility-Administered Medications Prior to Visit  Medication Dose Route Frequency Provider Last Rate Last Admin   testosterone cypionate (DEPOTESTOSTERONE CYPIONATE) injection 200 mg  200 mg Intramuscular Weekly Nelwyn Salisbury, MD   200 mg at 09/07/22 1008   testosterone cypionate (DEPOTESTOSTERONE CYPIONATE) injection 200 mg  200 mg Intramuscular Q14 Days Nelwyn Salisbury, MD   200 mg at 08/31/22 1007   testosterone cypionate (DEPOTESTOSTERONE CYPIONATE) injection 200 mg  200 mg Intramuscular Once Nelwyn Salisbury, MD       testosterone cypionate (DEPOTESTOSTERONE CYPIONATE) injection 200 mg  200 mg Intramuscular Weekly Nelwyn Salisbury, MD   200 mg at 09/21/22 1145   testosterone cypionate (DEPOTESTOSTERONE CYPIONATE) injection 200 mg  200 mg Intramuscular Weekly Nelwyn Salisbury, MD       testosterone  cypionate (DEPOTESTOSTERONE CYPIONATE) injection 200 mg  200 mg Intramuscular Weekly Deeann Saint, MD   200 mg at 10/12/22 1006   testosterone cypionate (DEPOTESTOSTERONE CYPIONATE) injection 200 mg  200 mg Intramuscular Weekly Nelwyn Salisbury, MD   200 mg at 10/26/22 1128      Review of Systems  Review of Systems  Constitutional: Negative.  Negative for fatigue.  Respiratory: Negative.    Psychiatric/Behavioral: Negative.       Physical Exam  BP (!) 142/84   Pulse 86   Wt 299 lb (135.6 kg)   SpO2 96%   BMI 41.70 kg/m  Physical Exam Constitutional:       Appearance: Normal appearance.  HENT:     Head: Normocephalic and atraumatic.  Cardiovascular:     Rate and Rhythm: Normal rate and regular rhythm.  Pulmonary:     Effort: Pulmonary effort is normal.  Musculoskeletal:        General: Normal range of motion.  Skin:    General: Skin is warm and dry.  Neurological:     General: No focal deficit present.     Mental Status: He is alert and oriented to person, place, and time. Mental status is at baseline.  Psychiatric:        Mood and Affect: Mood normal.        Behavior: Behavior normal.        Thought Content: Thought content normal.        Judgment: Judgment normal.      Lab Results:  CBC    Component Value Date/Time   WBC 6.6 08/04/2022 1126   RBC 5.22 08/04/2022 1126   HGB 16.1 08/04/2022 1126   HCT 47.7 08/04/2022 1126   PLT 300.0 08/04/2022 1126   MCV 91.5 08/04/2022 1126   MCH 30.3 01/30/2019 0936   MCHC 33.7 08/04/2022 1126   RDW 15.1 08/04/2022 1126   LYMPHSABS 1.2 08/04/2022 1126   MONOABS 0.6 08/04/2022 1126   EOSABS 0.3 08/04/2022 1126   BASOSABS 0.1 08/04/2022 1126    BMET    Component Value Date/Time   NA 137 06/23/2022 1606   K 3.5 06/23/2022 1606   CL 102 06/23/2022 1606   CO2 25 06/23/2022 1606   GLUCOSE 111 (H) 06/23/2022 1606   BUN 21 06/23/2022 1606   CREATININE 1.25 06/23/2022 1606   CALCIUM 9.8 06/23/2022 1606   GFRNONAA >60 01/30/2019 0936   GFRAA >60 01/30/2019 0936    BNP No results found for: "BNP"  ProBNP No results found for: "PROBNP"  Imaging: No results found.   Assessment & Plan:   OSA (obstructive sleep apnea) - Patient has severe sleep apnea, AHI 101/hour.  Current CPAP machine stopped working in August 2024.  He has been using a Industrial/product designer.  Current pressure 16 cm H2O; Residual AHI 1.2/hour.  He is 100% compliant with use greater than 4 hours over the last 30 days.  She is with mask fit or pressure settings.  DME order placed for replacement CPAP machine at  current pressure settings.  He will need follow-up in 3 months for compliance check (virtual okay).  05/12/2023 Received updated compliance report from 06/13/2023 through 07/12/2023.  Patient is 97% compliant with CPAP use greater than 4 hours.  Average usage 8 hours 3 minutes.  Current pressure 16 cm H2O.  Residual AHI 0.9.   Glenford Bayley, NP 05/14/2023

## 2023-05-12 NOTE — Patient Instructions (Signed)
Apnea well-controlled on CPAP at current pressure settings No changes recommended  Orders: New CPAP 16 cm H2O (DME [facility]- ordered)  Follow-up: 3 months with Jermaine James (virtual on Friday afternoon ok)  CPAP and BIPAP Information CPAP and BIPAP are methods that use air pressure to keep your airways open and to help you breathe well. CPAP and BIPAP use different amounts of pressure. Your health care provider will tell you whether CPAP or BIPAP would be more helpful for you. CPAP stands for "continuous positive airway pressure." With CPAP, the amount of pressure stays the same while you breathe in (inhale) and out (exhale). BIPAP stands for "bi-level positive airway pressure." With BIPAP, the amount of pressure will be higher when you inhale and lower when you exhale. This allows you to take larger breaths. CPAP or BIPAP may be used in the hospital, or your health care provider may want you to use it at home. You may need to have a sleep study before your health care provider can order a machine for you to use at home. What are the advantages? CPAP or BIPAP can be helpful if you have: Sleep apnea. Chronic obstructive pulmonary disease (COPD). Heart failure. Medical conditions that cause muscle weakness, including muscular dystrophy or amyotrophic lateral sclerosis (ALS). Other problems that cause breathing to be shallow, weak, abnormal, or difficult. CPAP and BIPAP are most commonly used for obstructive sleep apnea (OSA) to keep the airways from collapsing when the muscles relax during sleep. What are the risks? Generally, this is a safe treatment. However, problems may occur, including: Irritated skin or skin sores if the mask does not fit properly. Dry or stuffy nose or nosebleeds. Dry mouth. Feeling gassy or bloated. Sinus or lung infection if the equipment is not cleaned properly. When should CPAP or BIPAP be used? In most cases, the mask only needs to be worn during sleep.  Generally, the mask needs to be worn throughout the night and during any daytime naps. People with certain medical conditions may also need to wear the mask at other times, such as when they are awake. Follow instructions from your health care provider about when to use the machine. What happens during CPAP or BIPAP?  Both CPAP and BIPAP are provided by a small machine with a flexible plastic tube that attaches to a plastic mask that you wear. Air is blown through the mask into your nose or mouth. The amount of pressure that is used to blow the air can be adjusted on the machine. Your health care provider will set the pressure setting and help you find the best mask for you. Tips for using the mask Because the mask needs to be snug, some people feel trapped or closed-in (claustrophobic) when first using the mask. If you feel this way, you may need to get used to the mask. One way to do this is to hold the mask loosely over your nose or mouth and then gradually apply the mask more snugly. You can also gradually increase the amount of time that you use the mask. Masks are available in various types and sizes. If your mask does not fit well, talk with your health care provider about getting a different one. Some common types of masks include: Full face masks, which fit over the mouth and nose. Nasal masks, which fit over the nose. Nasal pillow or prong masks, which fit into the nostrils. If you are using a mask that fits over your nose and you  tend to breathe through your mouth, a chin strap may be applied to help keep your mouth closed. Use a skin barrier to protect your skin as told by your health care provider. Some CPAP and BIPAP machines have alarms that may sound if the mask comes off or develops a leak. If you have trouble with the mask, it is very important that you talk with your health care provider about finding a way to make the mask easier to tolerate. Do not stop using the mask. There could  be a negative impact on your health if you stop using the mask. Tips for using the machine Place your CPAP or BIPAP machine on a secure table or stand near an electrical outlet. Know where the on/off switch is on the machine. Follow instructions from your health care provider about how to set the pressure on your machine and when you should use it. Do not eat or drink while the CPAP or BIPAP machine is on. Food or fluids could get pushed into your lungs by the pressure of the CPAP or BIPAP. For home use, CPAP and BIPAP machines can be rented or purchased through home health care companies. Many different brands of machines are available. Renting a machine before purchasing may help you find out which particular machine works well for you. Your health insurance company may also decide which machine you may get. Keep the CPAP or BIPAP machine and attachments clean. Ask your health care provider for specific instructions. Check the humidifier if you have a dry stuffy nose or nosebleeds. Make sure it is working correctly. Follow these instructions at home: Take over-the-counter and prescription medicines only as told by your health care provider. Ask if you can take sinus medicine if your sinuses are blocked. Do not use any products that contain nicotine or tobacco. These products include cigarettes, chewing tobacco, and vaping devices, such as e-cigarettes. If you need help quitting, ask your health care provider. Keep all follow-up visits. This is important. Contact a health care provider if: You have redness or pressure sores on your head, face, mouth, or nose from the mask or head gear. You have trouble using the CPAP or BIPAP machine. You cannot tolerate wearing the CPAP or BIPAP mask. Someone tells you that you snore even when wearing your CPAP or BIPAP. Get help right away if: You have trouble breathing. You feel confused. Summary CPAP and BIPAP are methods that use air pressure to keep your  airways open and to help you breathe well. If you have trouble with the mask, it is very important that you talk with your health care provider about finding a way to make the mask easier to tolerate. Do not stop using the mask. There could be a negative impact to your health if you stop using the mask. Follow instructions from your health care provider about when to use the machine. This information is not intended to replace advice given to you by your health care provider. Make sure you discuss any questions you have with your health care provider. Document Revised: 03/10/2021 Document Reviewed: 07/10/2020 Elsevier Patient Education  2023 [employer].

## 2023-05-14 NOTE — Assessment & Plan Note (Signed)
-   Patient has severe sleep apnea, AHI 101/hour.  Current CPAP machine stopped working in August 2024.  He has been using a Industrial/product designer.  Current pressure 16 cm H2O; Residual AHI 1.2/hour.  He is 100% compliant with use greater than 4 hours over the last 30 days.  She is with mask fit or pressure settings.  DME order placed for replacement CPAP machine at current pressure settings.  He will need follow-up in 3 months for compliance check (virtual okay).

## 2023-05-15 NOTE — Progress Notes (Signed)
Reviewed and agree with assessment/plan.   Coralyn Helling, MD [facility] Pulmonary/Critical Care 05/15/2023, 8:20 AM Pager:  [phone]

## 2023-06-26 ENCOUNTER — Ambulatory Visit: Payer: Medicare Other | Admitting: Primary Care

## 2023-08-01 ENCOUNTER — Encounter: Payer: Self-pay | Admitting: Family Medicine

## 2023-08-01 NOTE — Telephone Encounter (Signed)
 Care team updated and letter sent for eye exam notes.

## 2023-08-04 ENCOUNTER — Telehealth: Payer: Medicare Other | Admitting: Primary Care

## 2023-08-04 DIAGNOSIS — G4733 Obstructive sleep apnea (adult) (pediatric): Secondary | ICD-10-CM | POA: Diagnosis not present

## 2023-08-04 NOTE — Progress Notes (Signed)
Virtual Visit via Video Note  I connected with Jermaine James. on 08/04/23 at  1:30 PM EST by a video enabled telemedicine application and verified that I am speaking with the correct person using two identifiers.  Location: Patient: Home Provider: Office   I discussed the limitations of evaluation and management by telemedicine and the availability of in person appointments. The patient expressed understanding and agreed to proceed.  History of Present Illness: 67 year old male, never smoked.  Past medical history significant for OSA, hypertension, type 2 diabetes, dyslipidemia, increased PSA, lumbar stenosis, total knee arthroplasty.  Previous [facility] pulmonary: 05/12/2023 Presents today for OSA follow-up.  Patient had sleep study in 2006 that showed severe OSA, AHI 101/hr. Previous machine stopped working in August. He has been using a Industrial/product designer for last month, needs new prescribed for CPAP. Current pressure 16cm which he is tolerating. No issues with mask fit or pressure settings. No longer snoring. Denies excessive daytime sleepiness.  Airview download 03/12/23-04/10/23 Usage 30/30 days (100%) > 4 hours Average usage 8 hours 35 mins Pressure 16cm h20 Airleaks 18.2L/min (95%) AHI 1.2   08/04/2023- Interim hx  Discussed the use of AI scribe software for clinical note transcription with the patient, who gave verbal consent to proceed.  History of Present Illness   Patient contacted today for virtual visit for CPAP compliance. During his last visit in September he was ordered for replacement CPAP machine through [facility].   The patient, diagnosed with sleep apnea, has been using a CPAP machine consistently and without issues since he last visit in September. He recently received a replacement machine, identical to the previous model, and has been using it faithfully. The patient reports no issues with the new machine and has been followed up by the provider since receiving it. He has  been using the CPAP machine every night for an average of eight hours and twenty-four minutes. Current pressure 16cm h20 without significant air leaks.  The patient has been using a full face mask with the CPAP machine. He has been considering other options due to technological advancements and potential changes in medical recommendations. However, he reports no discomfort with the full face mask and has been using it consistently. The patient is contemplating trying a hybrid mask, which goes under the nose and over the mouth, but is unsure if it would be as efficient as the full face mask. He has expressed interest in a mask fitting to explore different options.     Airview download 07/04/2023 - 08/02/2023 Usage days 30/30 days (100%) Average usage 8 hours 24 minutes Pressure 16 cm H2O Air leaks 10.3 L/min (95%) AHI 0.8  Observations/Objective:  Appears well without overt respiratory symptoms  Assessment and Plan:  1. OSA (obstructive sleep apnea) (Primary)     Obstructive Sleep Apnea Patient has severe sleep apnea, well controlled with CPAP therapy. He is 100% compliant with use > 4 hours over the last 30 days. Average use of 8 hours and 24 minutes per night. Current pressure 16cm h20 with residual apnea score 0.8/hour, indicating effective treatment. No significant air leaks. Patient is comfortable with current full face mask, but inquired about other mask options. -Continue current CPAP therapy with pressure of 16cm h20. -Consider mask fitting with DME for alternative mask options if patient decides to explore this. Potential options include a hybrid mask or a nasal mask, depending on whether the patient is a mouth breather. -Check in annually for compliance and to renew supplies if  billed through insurance. -Reach out if there are any issues with the CPAP machine or mask.      Follow Up Instructions:   1 year with Beth NP or sooner   I discussed the assessment and treatment plan  with the patient. The patient was provided an opportunity to ask questions and all were answered. The patient agreed with the plan and demonstrated an understanding of the instructions.   The patient was advised to call back or seek an in-person evaluation if the symptoms worsen or if the condition fails to improve as anticipated.  I provided 22 minutes of non-face-to-face time during this encounter.   Glenford Bayley, NP

## 2023-08-04 NOTE — Patient Instructions (Signed)
 -  OBSTRUCTIVE SLEEP APNEA: Obstructive sleep apnea is a condition where your airway becomes blocked during sleep, causing breathing pauses. Your condition is well controlled with your CPAP therapy, and you are using the machine every night for an average of eight hours and twenty-four minutes. Your apnea score is very low, and there are no significant air leaks. You are comfortable with your current full face mask, but you may consider a mask fitting to explore other options like a hybrid mask or a nasal mask. Continue using your CPAP machine with a pressure setting of 16. We will check in annually to ensure compliance and renew supplies if needed. Please reach out if you experience any issues with your CPAP machine or mask.  INSTRUCTIONS:  Consider scheduling a mask fitting if you decide to explore alternative mask options. We will check in annually to ensure compliance and renew supplies if billed through insurance. Please contact us if you experience any issues with your CPAP machine or mask.

## 2023-11-07 ENCOUNTER — Encounter: Payer: Self-pay | Admitting: Internal Medicine

## 2023-11-07 ENCOUNTER — Ambulatory Visit (INDEPENDENT_AMBULATORY_CARE_PROVIDER_SITE_OTHER): Payer: Medicare Other | Admitting: Internal Medicine

## 2023-11-07 VITALS — BP 124/70 | HR 77 | Ht 71.0 in | Wt 295.6 lb

## 2023-11-07 DIAGNOSIS — E66813 Obesity, class 3: Secondary | ICD-10-CM

## 2023-11-07 DIAGNOSIS — E1165 Type 2 diabetes mellitus with hyperglycemia: Secondary | ICD-10-CM | POA: Diagnosis not present

## 2023-11-07 DIAGNOSIS — Z7984 Long term (current) use of oral hypoglycemic drugs: Secondary | ICD-10-CM

## 2023-11-07 DIAGNOSIS — E785 Hyperlipidemia, unspecified: Secondary | ICD-10-CM

## 2023-11-07 LAB — POCT GLYCOSYLATED HEMOGLOBIN (HGB A1C): Hemoglobin A1C: 5.6 % (ref 4.0–5.6)

## 2023-11-07 MED ORDER — METFORMIN HCL 1000 MG PO TABS
1000.0000 mg | ORAL_TABLET | Freq: Two times a day (BID) | ORAL | 3 refills | Status: AC
Start: 2023-11-07 — End: ?

## 2023-11-07 NOTE — Patient Instructions (Signed)
Please continue: - Metformin 1000 mg 2x a day with meals  Please return in 6 months with your sugar log.

## 2023-11-07 NOTE — Progress Notes (Signed)
 Patient ID: Jermaine James., male   DOB: 1955/10/02, 68 y.o.   MRN: [id number]   HPI: Jermaine James. is a 68 y.o.-year-old male, initially referred by his PCP, Dr. Clent Ridges, 68 returning for follow-up for DM2, dx in 2012, non-insulin-dependent, now controlled, without long-term complications. He is the husband of Jermaine James, who is also my pt.  Last visit 6 months ago.  Interim history: No increased urination, blurry vision, nausea, chest pain. He is still drinking regular sodas - still 1-2 x a week. He is recently busy with his wife, who fractured her ankle and he was her caregiver.  She is recovering now.  Reviewed HbA1c level: Lab Results  Component Value Date   HGBA1C 5.6 05/10/2023   HGBA1C 5.8 (A) 11/07/2022   HGBA1C 6.1 (A) 06/03/2022   HGBA1C 6.7 (H) 12/07/2021   HGBA1C 6.4 (A) 07/27/2021   HGBA1C 6.1 (A) 02/16/2021   HGBA1C 6.5 (A) 09/29/2020   HGBA1C 6.7 (A) 05/28/2020   HGBA1C 6.2 (A) 01/22/2020   HGBA1C 7.6 (A) 10/18/2019   HGBA1C 8.3 (H) 07/18/2019   HGBA1C 7.6 (H) 01/30/2019   HGBA1C 6.8 (H) 02/01/2018   HGBA1C 7.0 (H) 09/02/2016   HGBA1C 6.5 04/20/2016   HGBA1C 6.1 05/12/2015   HGBA1C 6.5 01/16/2014   HGBA1C 7.0 (H) 08/20/2012   HGBA1C 6.5 (H) 07/02/2012   HGBA1C 6.6 (H) 07/05/2011   HGBA1C 6.4 06/23/2010   HGBA1C 6.4 10/30/2009   HGBA1C 6.4 07/24/2009   HGBA1C 6.3 (H) 04/20/2007   Pt is on a regimen of: - Metformin 500 mg 2x a day, with meals >> 1000 mg twice a day  He checks sugars 0 to 1x a day: - am: 106-128 >> 105-112 >> 102, 109-114, 130 >> 96-118, 120 - 2h after b'fast: 121-147 >> n/c>> 109-122 >> 112-120  >> 105-115 - before lunch: 107-128 >> 109-122 >> 109-125 >> 97-119 - 2h after lunch: 122 >> 121-140 >> 126-139 >> 112-129  - before dinner: 115-136 >> 115-128 >> 128-143 >> 118-130 - 2h after dinner: 149-161, 168 >> 125-138 >> 146-158 >> 118-140 - bedtime:  119-213, 239 >> 139-168, 217 >> n/c >> 138 - nighttime: n/c Lowest sugar was 98  ...>> 105 >> 102 Highest sugar was 239 (pizza, Soda) ...>> 162  Glucometer: none >> True Focus  Pt's meals are: - Breakfast: cheerios + 1-2% milk; pancake + waffle + bacon; - Lunch: sandwich - PB/turkey/nan - Dinner: meat + starch + veggies - Snacks: 1-2x a day -  -No CKD, last BUN/creatinine:  Lab Results  Component Value Date   BUN 21 06/23/2022   BUN 15 12/07/2021   CREATININE 1.25 06/23/2022   CREATININE 0.85 12/07/2021   No MAU: Lab Results  Component Value Date   MICRALBCREAT 1.3 05/10/2023   MICRALBCREAT 0.8 09/29/2020   MICRALBCREAT 1.7 01/22/2020   MICRALBCREAT 1.0 05/12/2015   MICRALBCREAT 0.7 08/20/2012   MICRALBCREAT 0.3 07/05/2011   MICRALBCREAT 4.3 07/24/2009   MICRALBCREAT 4.3 04/20/2007  On lisinopril 20.  -+ HL; last set of lipids: Lab Results  Component Value Date   CHOL 142 02/27/2023   HDL 53.40 02/27/2023   LDLCALC 66 02/27/2023   LDLDIRECT 86.0 02/01/2018   TRIG 111.0 02/27/2023   CHOLHDL 3 02/27/2023  On fenofibrate 160. He refused statins.  - last eye exam was 12/27/2022: No DR reportedly. Artificial eye OS.  + Cataract OD >> had sx. He had retinal tears >> saw Dr. Allyne Gee (retina) 04/26/2023.  - no  numbness and tingling in his feet. Last foot exam 11/07/2022.  He sees podiatry on an as-needed basis.  Has Achilles tendonitis >> improved. Wears shoe inserts.  Pt has no FH of DM.  He also has a history of OSA-on CPAP, gout, HTN. He also has a history of hypogonadism.  He was on testosterone replacement, but had side effects: rage, irritability, paranoia - stopped >> restarted by urology.   ROS: + see HPI  I reviewed pt's medications, allergies, PMH, social hx, family hx, and changes were documented in the history of present illness. Otherwise, unchanged from my initial visit note.  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 of post-op N/V   High triglycerides    no current med.   History of gout    History of kidney stones    Hypertension    states under control with med., has been on med. ~ 20 yr.   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 Rhea Belton, adenomatous polyps, repeat in 5 yrs   COLONOSCOPY WITH PROPOFOL N/A 08/25/2022   Procedure: COLONOSCOPY WITH PROPOFOL;  Surgeon: Napoleon Form, MD;  Location: [facility];  Service: Gastroenterology;  Laterality: N/A;   CYSTOSCOPY  2011   "related to trace blood in urine"   EYE SURGERY Left 1961   traumatic loss of eye   INGUINAL HERNIA REPAIR Left    KNEE ARTHROSCOPY Left 1990s?   KNEE ARTHROSCOPY WITH MEDIAL MENISECTOMY Right 11/08/2012   Procedure: RIGHT KNEE ARTHROSCOPY WITH PARTIAL MEDIAL AND LATERAL MENISECTOMIES, CHONDROPLASTY;  Surgeon: Loreta Ave, MD;  Location: [facility];  Service: Orthopedics;  Laterality: Right;  RIGHT KNEE SCOPE WITH PARTIAL MEDIAL AND LATERAL  MENISCECTOMIES  AND CHONDROPLASTY   KNEE CLOSED REDUCTION Left 08/18/2016   Procedure: LEFT  MANIPULATION KNEE;  Surgeon: Loreta Ave, MD;  Location:  SURGERY CENTER;  Service: Orthopedics;  Laterality: Left;   LIPOMA EXCISION Left    upper back   MOHS SURGERY Left 10/2015   "forehead"   RETINAL TEAR REPAIR CRYOTHERAPY Right 11/2017   SPINE SURGERY  02/07/2019   TOTAL KNEE ARTHROPLASTY Left 05/18/2016   Procedure: LEFT TOTAL KNEE ARTHROPLASTY;  Surgeon: Loreta Ave, MD;  Location: [facility] OR;  Service:  Orthopedics;  Laterality: Left;   TOTAL KNEE ARTHROPLASTY Right 09/14/2016   Procedure: TOTAL KNEE ARTHROPLASTY MANIPULATION LEFT KNEE;  Surgeon: Loreta Ave, MD;  Location: [facility] OR;  Service: Orthopedics;  Laterality: Right;   Social History   Socioeconomic History   Marital status: Married    Spouse name: Not on file   Number of children: 3   Years of education: Not on file   Highest education level: Not on file  Occupational History   Occupation: Psychologist, occupational -retired  Tobacco Use  Smoking status: Never Smoker   Smokeless tobacco: Never Used  Substance and Sexual Activity   Alcohol use: Yes    Alcohol/week: 3.0 - 4.0 standard drinks    Types: Beer, wine, mixed   Drug use: No   Sexual activity: Yes  Other Topics Concern   Not on file  Social History Narrative   Not on file   Social Determinants of Health   Financial Resource Strain:    Difficulty of Paying Living Expenses: Not on file  Food Insecurity:    Worried About Running Out of Food in the Last Year: Not on file   [employer] of Food in the Last Year: Not on file  Transportation Needs:    Lack of Transportation (Medical): Not on file   Lack of Transportation (Non-Medical): Not on file  Physical Activity:    Days of Exercise per Week: Not on file   Minutes of Exercise per Session: Not on file  Stress:    Feeling of Stress : Not on file  Social Connections:    Frequency of Communication with Friends and Family: Not on file   Frequency of Social Gatherings with Friends and Family: Not on file   Attends Religious Services: Not on file   Active Member of Clubs or Organizations: Not on file   Attends Banker Meetings: Not on file   Marital Status: Not on file  Intimate Partner Violence:    Fear of Current or Ex-Partner: Not on file   Emotionally Abused: Not on file   Physically Abused: Not on file   Sexually Abused: Not on file   Current Outpatient Medications on File Prior to Visit  Medication Sig  Dispense Refill   fenofibrate 160 MG tablet Take 1 tablet (160 mg total) by mouth daily. 90 tablet 3   JATENZO 237 MG CAPS Take 1 capsule by mouth 2 (two) times daily.     lisinopril-hydrochlorothiazide (ZESTORETIC) 20-25 MG tablet TAKE 1 TABLET BY MOUTH DAILY 90 tablet 3   metFORMIN (GLUCOPHAGE) 1000 MG tablet Take 1 tablet (1,000 mg total) by mouth 2 (two) times daily with a meal. 180 tablet 3   Current Facility-Administered Medications on File Prior to Visit  Medication Dose Route Frequency Provider Last Rate Last Admin   testosterone cypionate (DEPOTESTOSTERONE CYPIONATE) injection 200 mg  200 mg Intramuscular Weekly Nelwyn Salisbury, MD   200 mg at 09/07/22 1008   testosterone cypionate (DEPOTESTOSTERONE CYPIONATE) injection 200 mg  200 mg Intramuscular Q14 Days Nelwyn Salisbury, MD   200 mg at 08/31/22 1007   testosterone cypionate (DEPOTESTOSTERONE CYPIONATE) injection 200 mg  200 mg Intramuscular Once Nelwyn Salisbury, MD       testosterone cypionate (DEPOTESTOSTERONE CYPIONATE) injection 200 mg  200 mg Intramuscular Weekly Nelwyn Salisbury, MD   200 mg at 09/21/22 1145   testosterone cypionate (DEPOTESTOSTERONE CYPIONATE) injection 200 mg  200 mg Intramuscular Weekly Nelwyn Salisbury, MD       testosterone cypionate (DEPOTESTOSTERONE CYPIONATE) injection 200 mg  200 mg Intramuscular Weekly Deeann Saint, MD   200 mg at 10/12/22 1006   testosterone cypionate (DEPOTESTOSTERONE CYPIONATE) injection 200 mg  200 mg Intramuscular Weekly Nelwyn Salisbury, MD   200 mg at 10/26/22 1128   No Known Allergies Family History  Problem Relation Age of Onset   Anesthesia problems Father        post-op N/V   Colon cancer Neg Hx    Esophageal cancer Neg Hx  Rectal cancer Neg Hx    Stomach cancer Neg Hx    PE: BP 124/70   Pulse 77   Ht 5\' 11"  (1.803 m)   Wt 295 lb 9.6 oz (134.1 kg)   SpO2 95%   BMI 41.23 kg/m  Wt Readings from Last 10 Encounters:  11/07/23 295 lb 9.6 oz (134.1 kg)  05/12/23 299 lb  (135.6 kg)  05/10/23 299 lb 6.4 oz (135.8 kg)  02/27/23 291 lb (132 kg)  01/03/23 284 lb (128.8 kg)  12/09/22 284 lb 3.2 oz (128.9 kg)  11/07/22 297 lb (134.7 kg)  10/19/22 298 lb 4 oz (135.3 kg)  09/14/22 294 lb 3.2 oz (133.4 kg)  08/26/22 289 lb (131.1 kg)   Constitutional: overweight, in NAD Eyes:  no exophthalmos; L eye prosthetic ENT: no neck masses, no cervical lymphadenopathy Cardiovascular: RRR, No MRG Respiratory: CTA B Musculoskeletal: no deformities Skin:no rashes Neurological: no tremor with outstretched hands Diabetic Foot Exam - Simple   Simple Foot Form Diabetic Foot exam was performed with the following findings: Yes 11/07/2023 10:38 AM  Visual Inspection No deformities, no ulcerations, no other skin breakdown bilaterally: Yes Sensation Testing Intact to touch and monofilament testing bilaterally: Yes Pulse Check Posterior Tibialis and Dorsalis pulse intact bilaterally: Yes Comments    ASSESSMENT: 1. DM2, non-insulin-dependent, now controlled, without long-term complications, but with hyperglycemia  2. HL  3.  Obesity class III  PLAN:  1. Patient with longstanding, previously uncontrolled type 2 diabetes, with improvement in control on metformin only in the last 3 years.  HbA1c at last visit was lower, at 5.6%.  Sugars were well-controlled, only slightly higher before dinner, but otherwise at goal.  We again discussed about stopping sodas completely but did not have to change his regimen. -At today's visit, sugars remain at goal at all times of the day.  No changes are needed in his regimen.  I refilled his metformin today -I suggested to: Patient Instructions  Please continue: - Metformin 1000 mg 2x a day with meals  Please return in 6 months with your sugar log.   - we checked his HbA1c: 5.6% (stable) - advised to check sugars at different times of the day - 1x a day, rotating check times - advised for yearly eye exams >> he is UTD - will check a CMP  and a lipid panel today - we discussed at this visit about possibly continuing to follow-up with PCP for this problem but he would prefer to continue to see me for this - return to clinic in 6 months  2. HL -Lipid panel was at goal in 02/2023: Lab Results  Component Value Date   CHOL 142 02/27/2023   HDL 53.40 02/27/2023   LDLCALC 66 02/27/2023   LDLDIRECT 86.0 02/01/2018   TRIG 111.0 02/27/2023   CHOLHDL 3 02/27/2023  -He continues fenofibrate 160 mg daily.  He refused a statin despite a discussion about benefits beyond lowering cholesterol levels. -Will check a lipid panel today  3.  Obesity class III -He continues on metformin which has an appetite suppressant effect long-term -We discussed about stopping sweet drinks at the previous visit -He gained 5 pounds before last visit, previously lost 6 -He lost 4 pounds since last visit.  Orders Placed This Encounter  Procedures   COMPLETE METABOLIC PANEL WITH GFR   Lipid Panel w/reflex Direct LDL   Carlus Pavlov, MD PhD [facility] Endocrinology

## 2023-11-08 ENCOUNTER — Encounter: Payer: Self-pay | Admitting: Internal Medicine

## 2023-11-08 LAB — COMPREHENSIVE METABOLIC PANEL
AG Ratio: 1.5 (calc) (ref 1.0–2.5)
ALT: 30 U/L (ref 9–46)
AST: 28 U/L (ref 10–35)
Albumin: 4.7 g/dL (ref 3.6–5.1)
Alkaline phosphatase (APISO): 35 U/L (ref 35–144)
BUN: 17 mg/dL (ref 7–25)
CO2: 29 mmol/L (ref 20–32)
Calcium: 9.8 mg/dL (ref 8.6–10.3)
Chloride: 101 mmol/L (ref 98–110)
Creat: 0.98 mg/dL (ref 0.70–1.35)
Globulin: 3.1 g/dL (ref 1.9–3.7)
Glucose, Bld: 97 mg/dL (ref 65–99)
Potassium: 4.1 mmol/L (ref 3.5–5.3)
Sodium: 138 mmol/L (ref 135–146)
Total Bilirubin: 0.6 mg/dL (ref 0.2–1.2)
Total Protein: 7.8 g/dL (ref 6.1–8.1)
eGFR: 83 mL/min/{1.73_m2} (ref >=60)

## 2023-11-08 LAB — LIPID PANEL W/REFLEX DIRECT LDL
Cholesterol: 137 mg/dL (ref <200)
HDL: 36 mg/dL — ABNORMAL LOW (ref >=40)
LDL Cholesterol (Calc): 78 mg/dL
Non-HDL Cholesterol (Calc): 101 mg/dL (ref <130)
Total CHOL/HDL Ratio: 3.8 (calc) (ref <5.0)
Triglycerides: 135 mg/dL (ref <150)

## 2023-11-09 NOTE — Addendum Note (Signed)
 Addended by: Pollie Meyer on: 11/09/2023 09:31 AM   Modules accepted: Orders

## 2023-11-30 ENCOUNTER — Other Ambulatory Visit: Payer: Self-pay | Admitting: Family Medicine

## 2024-02-20 ENCOUNTER — Other Ambulatory Visit: Payer: Self-pay | Admitting: Family Medicine

## 2024-02-27 LAB — HM DIABETES EYE EXAM

## 2024-04-30 ENCOUNTER — Encounter: Payer: Self-pay | Admitting: Family Medicine

## 2024-04-30 ENCOUNTER — Ambulatory Visit (INDEPENDENT_AMBULATORY_CARE_PROVIDER_SITE_OTHER): Admitting: Family Medicine

## 2024-04-30 DIAGNOSIS — Z0000 Encounter for general adult medical examination without abnormal findings: Secondary | ICD-10-CM | POA: Diagnosis not present

## 2024-04-30 NOTE — Patient Instructions (Addendum)
 I really enjoyed getting to talk with you today! I am available on Tuesdays and Thursdays for virtual visits if you have any questions or concerns, or if I can be of any further assistance.   CHECKLIST FROM ANNUAL WELLNESS VISIT:  -Follow up (please call to schedule if not scheduled after visit):   -yearly for annual wellness visit with primary care office  Here is a list of your preventive care/health maintenance measures and the plan for each if any are due:  PLAN For any measures below that may be due:    1. Can get flu shot at the office or the pharmacy. Can get other vaccines at the pharmacy - please let us  know if you do so that we can update your record.   2. Can get labs at your next office visit.  Health Maintenance  Topic Date Due   Hepatitis C Screening  Never done   Diabetic kidney evaluation - Urine ACR  07/04/2012   DTaP/Tdap/Td (2 - Tdap) 10/29/2019   Influenza Vaccine  03/15/2024   COVID-19 Vaccine (4 - 2025-26 season) 05/16/2024 (Originally 04/15/2024)   Zoster Vaccines- Shingrix (1 of 2) 07/30/2024 (Originally 05/13/2004)   Pneumococcal Vaccine: 50+ Years (1 of 2 - PCV) 04/30/2025 (Originally 05/13/1973)   HEMOGLOBIN A1C  05/09/2024   Diabetic kidney evaluation - eGFR measurement  11/06/2024   FOOT EXAM  11/06/2024   OPHTHALMOLOGY EXAM  02/26/2025   Medicare Annual Wellness (AWV)  04/30/2025   Colonoscopy  08/26/2027   HPV VACCINES  Aged Out   Meningococcal B Vaccine  Aged Out    -See a dentist at least yearly  -Get your eyes checked and then per your eye specialist's recommendations  -Other issues addressed today:   -I have included below further information regarding a healthy whole foods based diet, physical activity guidelines for adults, stress management and opportunities for social connections. I hope you find this information useful.    -----------------------------------------------------------------------------------------------------------------------------------------------------------------------------------------------------------------------------------------------------------    NUTRITION: -eat real food: lots of colorful vegetables (half the plate) and fruits -5-7 servings of vegetables and fruits per day (fresh or steamed is best), exp. 2 servings of vegetables with lunch and dinner and 2 servings of fruit per day. Berries and greens such as kale and collards are great choices.  -consume on a regular basis:  fresh fruits, fresh veggies, fish, nuts, seeds, healthy oils (such as olive oil, avocado oil), whole grains (make sure for bread/pasta/crackers/etc., that the first ingredient on label contains the word whole), legumes. -can eat small amounts of dairy and lean meat (no larger than the palm of your hand), but avoid processed meats such as ham, bacon, lunch meat, etc. -drink water -try to avoid fast food and pre-packaged foods, processed meat, ultra processed foods/beverages (donuts, candy, etc.) -most experts advise limiting sodium to < 2300mg  per day, should limit further is any chronic conditions such as high blood pressure, heart disease, diabetes, etc. The American Heart Association advised that < 1500mg  is is ideal -try to avoid foods/beverages that contain any ingredients with names you do not recognize  -try to avoid foods/beverages  with added sugar or sweeteners/sweets  -try to avoid sweet drinks (including diet drinks): soda, juice, Gatorade, sweet tea, power drinks, diet drinks -try to avoid white rice, white bread, pasta (unless whole grain)  EXERCISE GUIDELINES FOR ADULTS: -if you wish to increase your physical activity, do so gradually and with the approval of your doctor -STOP and seek medical care immediately if you have  any chest pain, chest discomfort or trouble breathing when starting or  increasing exercise  -move and stretch your body, legs, feet and arms when sitting for long periods -Physical activity guidelines for optimal health in adults: -get at least 150 minutes per week of moderate exercise (can talk, but not sing); this is about 20-30 minutes of sustained activity 5-7 days per week or two 10-15 minute episodes of sustained activity 5-7 days per week -do some muscle building/resistance training/strength training at least 2 days per week  -balance exercises 3+ days per week:   Stand somewhere where you have something sturdy to hold onto if you lose balance    1) lift up on toes, then back down, start with 5x per day and work up to 20x   2) stand and lift one leg straight out to the side so that foot is a few inches of the floor, start with 5x each side and work up to 20x each side   3) stand on one foot, start with 5 seconds each side and work up to 20 seconds on each side  If you need ideas or help with getting more active:  -Silver sneakers https://tools.silversneakers.com  -Walk with a Doc: http://www.duncan-williams.com/  -try to include resistance (weight lifting/strength building) and balance exercises twice per week: or the following link for ideas: http://castillo-powell.com/  BuyDucts.dk  STRESS MANAGEMENT: -can try meditating, or just sitting quietly with deep breathing while intentionally relaxing all parts of your body for 5 minutes daily -if you need further help with stress, anxiety or depression please follow up with your primary doctor or contact the wonderful folks at [facility]: [phone]  SOCIAL CONNECTIONS: -options in [place] if you wish to engage in more social and exercise related activities:  -Silver sneakers https://tools.silversneakers.com  -Walk with a Doc: http://www.duncan-williams.com/  -Check out the [facility] Active Adults 50+  section on the [place] of [employer] (hiking clubs, book clubs, cards and games, chess, exercise classes, aquatic classes and much more) - see the website for details: https://www.[place].gov/departments/parks-recreation/active-adults50  -YouTube has lots of exercise videos for different ages and abilities as well  -[facility] (a variety of indoor and outdoor inperson activities for adults). [phone]. [address].  -Virtual Online Classes (a variety of topics): see seniorplanet.org or call [phone]  -consider volunteering at a school, hospice center, church, senior center or elsewhere

## 2024-04-30 NOTE — Progress Notes (Signed)
 PATIENT CHECK-IN and HEALTH RISK ASSESSMENT QUESTIONNAIRE:  -completed by phone/video for upcoming Medicare Preventive Visit  Pre-Visit Check-in: 1)Vitals (height, wt, BP, etc) - record in vitals section for visit on day of visit Request home vitals (wt, BP, etc.) and enter into vitals, THEN update Vital Signs SmartPhrase below at the top of the HPI. See below.  2)Review and Update Medications, Allergies PMH, Surgeries, Social history in Epic 3)Hospitalizations in the last year with date/reason? NO   4)Review and Update Care Team (patient's specialists) in Epic 5) Complete PHQ9 in Epic  6) Complete Fall Screening in Epic 7)Review all Health Maintenance Due and order if not done.  Medicare Wellness Patient Questionnaire:  Answer theses question about your habits: How often do you have a drink containing alcohol?once every 2 weeks  How many drinks containing alcohol do you have on a typical day when you are drinking?1-2 drinks  How often do you have six or more drinks on one occasion? No  Have you ever smoked?No  Quit date if applicable? Na   How many packs a day do/did you smoke? Na  Do you use smokeless tobacco?Na  Do you use an illicit drugs?No  On average, how many days per week do you engage in moderate to strenuous exercise (like a brisk walk)? 4 days a week  On average, how many minutes do you engage in exercise at this level?45- 50 Minutes, Belongs to [employer], does elliptical, weights and cycling  Are you sexually active? Yes Number of partners?one  Typical breakfast: toast, water  Typical lunch:Burger, Sub, chicken  Typical dinner:Protein, Vegetables  Typical snacks: Chips, cheese and crackers   Beverages: Water, ice tea   Answer theses question about your everyday activities: Can you perform most household chores?Yes  Are you deaf or have significant trouble hearing?No  Do you feel that you have a problem with memory?No  Do you feel safe at home?Yes  Last dentist  visit?2 weeks ago  8. Do you have any difficulty performing your everyday activities?No Are you having any difficulty walking, taking medications on your own, and or difficulty managing daily home needs?No  Do you have difficulty walking or climbing stairs?No  Do you have difficulty dressing or bathing?No  Do you have difficulty doing errands alone such as visiting a doctor's office or shopping?NO  Do you currently have any difficulty preparing food and eating?NO  Do you currently have any difficulty using the toilet?NO  Do you have any difficulty managing your finances?NO  Do you have any difficulties with housekeeping of managing your housekeeping?No    Do you have Advanced Directives in place (Living Will, Healthcare Power or Attorney)? Yes    Last eye Exam and location?July 2025, Dr. Lorrene Devonshire, [facility]    Do you currently use prescribed or non-prescribed narcotic or opioid pain medications?No   Do you have a history or close family history of breast, ovarian, tubal or peritoneal cancer or a family member with BRCA (breast cancer susceptibility 1 and 2) gene mutations? No   Nurse/Assistant Credentials/time stamp: Leah A.Wright CMA 11:21 am    ----------------------------------------------------------------------------------------------------------------------------------------------------------------------------------------------------------------------  Because this visit was a virtual/telehealth visit, some criteria may be missing or patient reported. Any vitals not documented were not able to be obtained and vitals that have been documented are patient reported.    MEDICARE ANNUAL PREVENTIVE CARE VISIT WITH PROVIDER (Welcome to Medicare, initial annual wellness or annual wellness exam)  Virtual Visit via Phone Note  I connected with  Jermaine James Jermaine James. on 04/30/24  by phone and verified that I am speaking with the correct person using two identifiers. Tried to do  video visit. He could see me but he could not get his video working and he preferred to finish by audio.   Location patient: home Location provider:work or home office Persons participating in the virtual visit: patient, provider  Concerns and/or follow up today: no concern, reports all is going well.    See HM section in Epic for other details of completed HM.    ROS: negative for report of fevers, unintentional weight loss, vision changes, vision loss, hearing loss or change, chest pain, sob, hemoptysis, melena, hematochezia, hematuria, falls, bleeding or bruising, thoughts of suicide or self harm, memory loss  Patient-completed extensive health risk assessment - reviewed and discussed with the patient: See Health Risk Assessment completed with patient prior to the visit either above or in recent phone note. This was reviewed in detailed with the patient today and appropriate recommendations, orders and referrals were placed as needed per Summary below and patient instructions.   Review of Medical History: -PMH, PSH, Family History and current specialty and care providers reviewed and updated and listed below   Patient Care Team: Johnny Garnette LABOR, MD as PCP - [facility]   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 of post-op N/V   High triglycerides    no current med.   History of gout    History of kidney stones    Hypertension    states under control with med., has been on med. ~ 20 yr.   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 Albertus, adenomatous polyps, repeat in 5 yrs   COLONOSCOPY WITH PROPOFOL  N/A 08/25/2022   Procedure: COLONOSCOPY WITH PROPOFOL ;  Surgeon: Shila Gustav GAILS, MD;  Location: [facility];  Service: Gastroenterology;  Laterality: N/A;   CYSTOSCOPY  2011   related to trace blood in urine   EYE SURGERY Left 1961   traumatic loss of eye   INGUINAL HERNIA REPAIR Left    KNEE ARTHROSCOPY Left 1990s?   KNEE ARTHROSCOPY WITH MEDIAL MENISECTOMY Right 11/08/2012   Procedure: RIGHT KNEE ARTHROSCOPY WITH PARTIAL MEDIAL AND LATERAL MENISECTOMIES, CHONDROPLASTY;  Surgeon: Toribio JULIANNA Chancy, MD;  Location: [facility];  Service: Orthopedics;  Laterality: Right;  RIGHT KNEE SCOPE WITH PARTIAL MEDIAL AND LATERAL  MENISCECTOMIES  AND CHONDROPLASTY   KNEE CLOSED REDUCTION Left 08/18/2016   Procedure: LEFT  MANIPULATION KNEE;  Surgeon: Toribio JULIANNA Chancy, MD;  Location: [facility];  Service: Orthopedics;  Laterality: Left;   LIPOMA EXCISION Left    upper back   MOHS SURGERY Left 10/2015   forehead   RETINAL TEAR REPAIR CRYOTHERAPY Right 11/2017  SPINE SURGERY  02/07/2019   TOTAL KNEE ARTHROPLASTY Left 05/18/2016   Procedure: LEFT TOTAL KNEE ARTHROPLASTY;  Surgeon: Toribio JULIANNA Chancy, MD;  Location[facility];  Service: Orthopedics;  Laterality: Left;   TOTAL KNEE ARTHROPLASTY Right 09/14/2016   Procedure: TOTAL KNEE ARTHROPLASTY MANIPULATION LEFT KNEE;  Surgeon: Toribio JULIANNA Chancy, MD;  Location: [facility] OR;  Service: Orthopedics;  Laterality: Right;    Social History   Socioeconomic History   Marital status: Married    Spouse name: Not on file   Number of children: Not on file   Years of education: Not on file   Highest education level: Bachelor's degree (e.g., BA, AB, BS)  Occupational History   Occupation: Psychologist, occupational    Comment: retired  Tobacco Use    Smoking status: Never   Smokeless tobacco: Never  Vaping Use   Vaping status: Never Used  Substance and Sexual Activity   Alcohol use: Yes    Alcohol/week: 3.0 - 4.0 standard drinks of alcohol    Types: 1 Glasses of wine, 1 Cans of beer, 1 - 2 Standard drinks or equivalent per week   Drug use: No   Sexual activity: Yes  Other Topics Concern   Not on file  Social History Narrative   Not on file   Social Drivers of Health   Financial Resource Strain: Low Risk  (04/29/2024)   Overall Financial Resource Strain (CARDIA)    Difficulty of Paying Living Expenses: Not hard at all  Food Insecurity: No Food Insecurity (04/29/2024)   Hunger Vital Sign    Worried About Running Out of Food in the Last Year: Never true    Ran Out of Food in the Last Year: Never true  Transportation Needs: No Transportation Needs (04/29/2024)   PRAPARE - Administrator, Civil Service (Medical): No    Lack of Transportation (Non-Medical): No  Physical Activity: Sufficiently Active (04/29/2024)   Exercise Vital Sign    Days of Exercise per Week: 4 days    Minutes of Exercise per Session: 50 min  Stress: No Stress Concern Present (04/29/2024)   [employer] of Occupational Health - Occupational Stress Questionnaire    Feeling of Stress: Not at all  Social Connections: Socially Integrated (04/29/2024)   Social Connection and Isolation Panel    Frequency of Communication with Friends and Family: More than three times a week    Frequency of Social Gatherings with Friends and Family: Once a week    Attends Religious Services: More than 4 times per year    Active Member of [employer] or Organizations: Yes    Attends Banker Meetings: 1 to 4 times per year    Marital Status: Married  Catering manager Violence: Not At Risk (12/09/2022)   Humiliation, Afraid, Rape, and Kick questionnaire    Fear of Current or Ex-Partner: No    Emotionally Abused: No    Physically Abused: No    Sexually Abused:  No    Family History  Problem Relation Age of Onset   Anesthesia problems Father        post-op N/V   Colon cancer Neg Hx    Esophageal cancer Neg Hx    Rectal cancer Neg Hx    Stomach cancer Neg Hx     Current Outpatient Medications on File Prior to Visit  Medication Sig Dispense Refill   fenofibrate  160 MG tablet TAKE 1 TABLET BY MOUTH DAILY 90 tablet 0   JATENZO 237 MG CAPS Take  1 capsule by mouth 2 (two) times daily.     lisinopril -hydrochlorothiazide  (ZESTORETIC ) 20-25 MG tablet TAKE 1 TABLET BY MOUTH DAILY 90 tablet 3   metFORMIN  (GLUCOPHAGE ) 1000 MG tablet Take 1 tablet (1,000 mg total) by mouth 2 (two) times daily with a meal. 180 tablet 3   Current Facility-Administered Medications on File Prior to Visit  Medication Dose Route Frequency Provider Last Rate Last Admin   testosterone  cypionate (DEPOTESTOSTERONE CYPIONATE) injection 200 mg  200 mg Intramuscular Weekly Johnny Garnette LABOR, MD   200 mg at 09/07/22 1008   testosterone  cypionate (DEPOTESTOSTERONE CYPIONATE) injection 200 mg  200 mg Intramuscular Q14 Days Johnny Garnette LABOR, MD   200 mg at 08/31/22 1007   testosterone  cypionate (DEPOTESTOSTERONE CYPIONATE) injection 200 mg  200 mg Intramuscular Once Fry, Stephen A, MD       testosterone  cypionate (DEPOTESTOSTERONE CYPIONATE) injection 200 mg  200 mg Intramuscular Weekly Johnny Garnette LABOR, MD   200 mg at 09/21/22 1145   testosterone  cypionate (DEPOTESTOSTERONE CYPIONATE) injection 200 mg  200 mg Intramuscular Weekly Johnny Garnette LABOR, MD       testosterone  cypionate (DEPOTESTOSTERONE CYPIONATE) injection 200 mg  200 mg Intramuscular Weekly Mercer Clotilda SAUNDERS, MD   200 mg at 10/12/22 1006   testosterone  cypionate (DEPOTESTOSTERONE CYPIONATE) injection 200 mg  200 mg Intramuscular Weekly Johnny Garnette LABOR, MD   200 mg at 10/26/22 1128    No Known Allergies     Physical Exam Vitals requested from patient and listed below if patient had equipment and was able to obtain at home for this  virtual visit: There were no vitals filed for this visit. Estimated body mass index is 41.23 kg/m as calculated from the following:   Height as of 11/07/23: 5' 11 (1.803 m).   Weight as of 11/07/23: 295 lb 9.6 oz (134.1 kg).  EKG (optional): deferred due to virtual visit  GENERAL: alert, oriented, no acute distress detected; full vision exam deferred due to pandemic and/or virtual encounter  PSYCH/NEURO: pleasant and cooperative, no obvious depression or anxiety, speech and thought processing grossly intact, Cognitive function grossly intact  Flowsheet Row Office Visit from [facility]/30/2024 in [facility]  PHQ-9 Total Score 0        04/30/2024   11:08 AM 02/27/2023   10:03 AM 01/03/2023    8:40 AM 12/09/2022   11:26 AM 12/07/2021   11:02 AM  Depression screen PHQ 2/9  Decreased Interest 0 0 0 0 0  Down, Depressed, Hopeless 0 0 0 0 0  PHQ - 2 Score 0 0 0 0 0  Altered sleeping 0 0 0  0  Tired, decreased energy 0 0 0  0  Change in appetite 0 0 0  0  Feeling bad or failure about yourself  0 0 0  0  Trouble concentrating 0 0 0  0  Moving slowly or fidgety/restless 0 0 0  0  Suicidal thoughts 0 0 0  0  PHQ-9 Score 0 0 0  0  Difficult doing work/chores  Not difficult at all Not difficult at all  Not difficult at all       01/03/2023    8:40 AM 02/27/2023   10:03 AM 12/11/2023   12:13 PM 04/29/2024   12:57 PM 04/30/2024   11:08 AM  Fall Risk  Falls in the past year? 0 0 0 0 0  Was there an injury with Fall? 0 0 0 0 0  Fall Risk  Category Calculator 0 0 0  0  0  Patient at Risk for Falls Due to No Fall Risks No Fall Risks     Fall risk Follow up Falls evaluation completed Falls evaluation completed   Falls evaluation completed     Patient-reported     SUMMARY AND PLAN:  Encounter for Medicare annual wellness exam  Discussed applicable health maintenance/preventive health measures and advised and referred or ordered per patient preferences: -discussed  vaccines due recs/risks, advised where can get, advised to let us  know if he does at the pharmacy so that we can update record.  -discussed other measures due and can get labs when seen for next in office visit Health Maintenance  Topic Date Due   Hepatitis C Screening  Never done   Diabetic kidney evaluation - Urine ACR  07/04/2012   DTaP/Tdap/Td (2 - Tdap) 10/29/2019   Influenza Vaccine  03/15/2024   COVID-19 Vaccine (4 - 2025-26 season) 05/16/2024 (Originally 04/15/2024)   Zoster Vaccines- Shingrix (1 of 2) 07/30/2024 (Originally 05/13/2004)   Pneumococcal Vaccine: 50+ Years (1 of 2 - PCV) 04/30/2025 (Originally 05/13/1973)   HEMOGLOBIN A1C  05/09/2024   Diabetic kidney evaluation - eGFR measurement  11/06/2024   FOOT EXAM  11/06/2024   OPHTHALMOLOGY EXAM  02/26/2025   Medicare Annual Wellness (AWV)  04/30/2025   Colonoscopy  08/26/2027   HPV VACCINES  Aged Out   Meningococcal B Vaccine  Aged [employer] and counseling on the following was provided based on the above review of health and a plan/checklist for the patient, along with additional information discussed, was provided for the patient in the patient instructions :   -Provided balance exercises that can be done at home to improve balance and discussed exercise guidelines for adults with include balance exercises at least 3 days per week.  -Advised and counseled on a healthy lifestyle -Reviewed patient's current diet. Advised and counseled on a whole foods based healthy diet. A summary of a healthy diet was provided in the Patient Instructions.  -reviewed patient's current physical activity level and discussed exercise guidelines for adults. Congratulated on healthy habits and further resources provided - see patient instructions.   -Advise yearly dental visits at minimum and regular eye exams   Follow up: see patient instructions   Patient Instructions  I really enjoyed getting to talk with you today! I am available on  Tuesdays and Thursdays for virtual visits if you have any questions or concerns, or if I can be of any further assistance.   CHECKLIST FROM ANNUAL WELLNESS VISIT:  -Follow up (please call to schedule if not scheduled after visit):   -yearly for annual wellness visit with primary care office  Here is a list of your preventive care/health maintenance measures and the plan for each if any are due:  PLAN For any measures below that may be due:    1. Can get flu shot at the office or the pharmacy. Can get other vaccines at the pharmacy - please let us  know if you do so that we can update your record.   2. Can get labs at your next office visit.  Health Maintenance  Topic Date Due   Hepatitis C Screening  Never done   Diabetic kidney evaluation - Urine ACR  07/04/2012   DTaP/Tdap/Td (2 - Tdap) 10/29/2019   Influenza Vaccine  03/15/2024   COVID-19 Vaccine (4 - 2025-26 season) 05/16/2024 (Originally 04/15/2024)   Zoster Vaccines- Shingrix (1 of  2) 07/30/2024 (Originally 05/13/2004)   Pneumococcal Vaccine: 50+ Years (1 of 2 - PCV) 04/30/2025 (Originally 05/13/1973)   HEMOGLOBIN A1C  05/09/2024   Diabetic kidney evaluation - eGFR measurement  11/06/2024   FOOT EXAM  11/06/2024   OPHTHALMOLOGY EXAM  02/26/2025   Medicare Annual Wellness (AWV)  04/30/2025   Colonoscopy  08/26/2027   HPV VACCINES  Aged Out   Meningococcal B Vaccine  Aged Out    -See a dentist at least yearly  -Get your eyes checked and then per your eye specialist's recommendations  -Other issues addressed today:   -I have included below further information regarding a healthy whole foods based diet, physical activity guidelines for adults, stress management and opportunities for social connections. I hope you find this information useful.    -----------------------------------------------------------------------------------------------------------------------------------------------------------------------------------------------------------------------------------------------------------    NUTRITION: -eat real food: lots of colorful vegetables (half the plate) and fruits -5-7 servings of vegetables and fruits per day (fresh or steamed is best), exp. 2 servings of vegetables with lunch and dinner and 2 servings of fruit per day. Berries and greens such as kale and collards are great choices.  -consume on a regular basis:  fresh fruits, fresh veggies, fish, nuts, seeds, healthy oils (such as olive oil, avocado oil), whole grains (make sure for bread/pasta/crackers/etc., that the first ingredient on label contains the word whole), legumes. -can eat small amounts of dairy and lean meat (no larger than the palm of your hand), but avoid processed meats such as ham, bacon, lunch meat, etc. -drink water -try to avoid fast food and pre-packaged foods, processed meat, ultra processed foods/beverages (donuts, candy, etc.) -most experts advise limiting sodium to < 2300mg  per day, should limit further is any chronic conditions such as high blood pressure, heart disease, diabetes, etc. The American Heart Association advised that < 1500mg  is is ideal -try to avoid foods/beverages that contain any ingredients with names you do not recognize  -try to avoid foods/beverages  with added sugar or sweeteners/sweets  -try to avoid sweet drinks (including diet drinks): soda, juice, Gatorade, sweet tea, power drinks, diet drinks -try to avoid white rice, white bread, pasta (unless whole grain)  EXERCISE GUIDELINES FOR ADULTS: -if you wish to increase your physical activity, do so gradually and with the approval of your doctor -STOP and seek medical care immediately if you have any chest pain, chest discomfort or trouble breathing when starting or  increasing exercise  -move and stretch your body, legs, feet and arms when sitting for long periods -Physical activity guidelines for optimal health in adults: -get at least 150 minutes per week of moderate exercise (can talk, but not sing); this is about 20-30 minutes of sustained activity 5-7 days per week or two 10-15 minute episodes of sustained activity 5-7 days per week -do some muscle building/resistance training/strength training at least 2 days per week  -balance exercises 3+ days per week:   Stand somewhere where you have something sturdy to hold onto if you lose balance    1) lift up on toes, then back down, start with 5x per day and work up to 20x   2) stand and lift one leg straight out to the side so that foot is a few inches of the floor, start with 5x each side and work up to 20x each side   3) stand on one foot, start with 5 seconds each side and work up to 20 seconds on each side  If you need ideas or help with getting  more active:  -Silver sneakers https://tools.silversneakers.com  -Walk with a Doc: http://www.duncan-williams.com/  -try to include resistance (weight lifting/strength building) and balance exercises twice per week: or the following link for ideas: http://castillo-powell.com/  BuyDucts.dk  STRESS MANAGEMENT: -can try meditating, or just sitting quietly with deep breathing while intentionally relaxing all parts of your body for 5 minutes daily -if you need further help with stress, anxiety or depression please follow up with your primary doctor or contact the wonderful folks at [facility]: [phone]  SOCIAL CONNECTIONS: -options in [place] if you wish to engage in more social and exercise related activities:  -Silver sneakers https://tools.silversneakers.com  -Walk with a Doc: http://www.duncan-williams.com/  -Check out the [facility] Active Adults 50+  section on the [place] of [employer] (hiking clubs, book clubs, cards and games, chess, exercise classes, aquatic classes and much more) - see the website for details: https://www.[place].gov/departments/parks-recreation/active-adults50  -YouTube has lots of exercise videos for different ages and abilities as well  -[facility] (a variety of indoor and outdoor inperson activities for adults). 703-526-4651. [address].  -Virtual Online Classes (a variety of topics): see seniorplanet.org or call [phone]  -consider volunteering at a school, hospice center, church, senior center or elsewhere            Chiquita JONELLE Cramp, DO

## 2024-05-06 ENCOUNTER — Ambulatory Visit: Admitting: Internal Medicine

## 2024-05-15 ENCOUNTER — Encounter: Payer: Self-pay | Admitting: Internal Medicine

## 2024-05-15 ENCOUNTER — Ambulatory Visit: Admitting: Internal Medicine

## 2024-05-15 VITALS — BP 134/68 | HR 87 | Resp 20 | Ht 71.0 in | Wt 298.2 lb

## 2024-05-15 DIAGNOSIS — E1165 Type 2 diabetes mellitus with hyperglycemia: Secondary | ICD-10-CM | POA: Diagnosis not present

## 2024-05-15 DIAGNOSIS — E785 Hyperlipidemia, unspecified: Secondary | ICD-10-CM | POA: Diagnosis not present

## 2024-05-15 DIAGNOSIS — E66813 Obesity, class 3: Secondary | ICD-10-CM

## 2024-05-15 DIAGNOSIS — Z7984 Long term (current) use of oral hypoglycemic drugs: Secondary | ICD-10-CM

## 2024-05-15 NOTE — Progress Notes (Signed)
 Patient ID: Jermaine James., male   DOB: Aug 27, 1955, 68 y.o.   MRN: [id number]   HPI: Jermaine James. is a 68 y.o.-year-old male, initially referred by his PCP, Dr. Johnny, returning for follow-up for DM2, dx in 2012, non-insulin -dependent, now controlled, without long-term complications. He is the husband of Jermaine James, who is also my pt.  Last visit 6 months ago.  Interim history: No increased urination, blurry vision, nausea, chest pain. He is still drinking regular sodas - still 1 a day.  Reviewed HbA1c level: Lab Results  Component Value Date   HGBA1C 5.6 11/07/2023   HGBA1C 5.6 05/10/2023   HGBA1C 5.8 (A) 11/07/2022   HGBA1C 6.1 (A) 06/03/2022   HGBA1C 6.7 (H) 12/07/2021   HGBA1C 6.4 (A) 07/27/2021   HGBA1C 6.1 (A) 02/16/2021   HGBA1C 6.5 (A) 09/29/2020   HGBA1C 6.7 (A) 05/28/2020   HGBA1C 6.2 (A) 01/22/2020   HGBA1C 7.6 (A) 10/18/2019   HGBA1C 8.3 (H) 07/18/2019   HGBA1C 7.6 (H) 01/30/2019   HGBA1C 6.8 (H) 02/01/2018   HGBA1C 7.0 (H) 09/02/2016   HGBA1C 6.5 04/20/2016   HGBA1C 6.1 05/12/2015   HGBA1C 6.5 01/16/2014   HGBA1C 7.0 (H) 08/20/2012   HGBA1C 6.5 (H) 07/02/2012   HGBA1C 6.6 (H) 07/05/2011   HGBA1C 6.4 06/23/2010   HGBA1C 6.4 10/30/2009   HGBA1C 6.4 07/24/2009   HGBA1C 6.3 (H) 04/20/2007   Pt is on a regimen of: - Metformin  500 mg 2x a day, with meals >> 1000 mg twice a day  He checks sugars 0 to 1x a day: - am: 105-112 >> 102, 109-114, 130 >> 96-118, 120 >> 105-118 - 2h after b'fast: 109-122 >> 112-120  >> 105-115 >> 112-130, 145 - before lunch: 109-122 >> 109-125 >> 97-119>> 110-128 - 2h after lunch: 121-140 >> 126-139 >> 112-129 >> 124-139, 145 - before dinner: 115-128 >> 128-143 >> 118-130 >> 115-142 - 2h after dinner: 1125-138 >> 146-158 >> 118-140 >> 131-151 - bedtime: 119-213, 239 >> 139-168, 217 >> n/c >> 138 >> 151 - nighttime: n/c Lowest sugar was 98 ...>> 105 >> 102 >> 98 Highest sugar was 239 (pizza, Soda) ...>> 162 >>  151  Glucometer: none >> True Focus  Pt's meals are: - Breakfast: cheerios + 1-2% milk; pancake + waffle + bacon; - Lunch: sandwich - PB/turkey/nan - Dinner: meat + starch + veggies - Snacks: 1-2x a day   -No CKD, last BUN/creatinine:  Lab Results  Component Value Date   BUN 17 11/07/2023   BUN 21 06/23/2022   CREATININE 0.98 11/07/2023   CREATININE 1.25 06/23/2022   No MAU: Lab Results  Component Value Date   MICRALBCREAT 0.3 07/05/2011   MICRALBCREAT 4.3 07/24/2009   MICRALBCREAT 4.3 04/20/2007  On lisinopril  20.  -+ HL; last set of lipids: Lab Results  Component Value Date   CHOL 137 11/07/2023   HDL 36 (L) 11/07/2023   LDLCALC 78 11/07/2023   LDLDIRECT 86.0 02/01/2018   TRIG 135 11/07/2023   CHOLHDL 3.8 11/07/2023  On fenofibrate  160. He refused statins.  - last eye exam was 02/28/2024: No DR. Artificial eye OS.  + Cataract OD >> had sx. He had retinal tears >> saw Dr. Jarold (retina) 04/26/2023.  - no numbness and tingling in his feet. Last foot exam 11/07/2023.  He sees podiatry on an as-needed basis.  Has Achilles tendonitis >> improved. Wears shoe inserts.  Pt has no FH of DM.  He also has a  history of OSA-on CPAP, gout, HTN. He also has a history of hypogonadism.  He was on testosterone  replacement, but had side effects: rage, irritability, paranoia - stopped >> restarted by urology.   ROS: + see HPI  I reviewed pt's medications, allergies, PMH, social hx, family hx, and changes were documented in the history of present illness. Otherwise, unchanged from my initial visit note.  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 of post-op N/V   High triglycerides    no current med.   History of gout    History of kidney stones    Hypertension     states under control with med., has been on med. ~ 20 yr.   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 Albertus, adenomatous polyps, repeat in 5 yrs   COLONOSCOPY WITH PROPOFOL  N/A 08/25/2022   Procedure: COLONOSCOPY WITH PROPOFOL ;  Surgeon: Shila Gustav GAILS, MD;  Location: [facility];  Service: Gastroenterology;  Laterality: N/A;   CYSTOSCOPY  2011   related to trace blood in urine   EYE SURGERY Left 1961   traumatic loss of eye   INGUINAL HERNIA REPAIR Left    KNEE ARTHROSCOPY Left 1990s?   KNEE ARTHROSCOPY WITH MEDIAL MENISECTOMY Right 11/08/2012   Procedure: RIGHT KNEE ARTHROSCOPY WITH PARTIAL MEDIAL AND LATERAL MENISECTOMIES, CHONDROPLASTY;  Surgeon: Toribio JULIANNA Chancy, MD;  Location: [facility];  Service: Orthopedics;  Laterality: Right;  RIGHT KNEE SCOPE WITH PARTIAL MEDIAL AND LATERAL  MENISCECTOMIES  AND CHONDROPLASTY   KNEE CLOSED REDUCTION Left 08/18/2016   Procedure: LEFT  MANIPULATION KNEE;  Surgeon: Toribio JULIANNA Chancy, MD;  Location:  SURGERY CENTER;  Service: Orthopedics;  Laterality: Left;   LIPOMA EXCISION Left    upper back   MOHS SURGERY Left 10/2015   forehead   RETINAL TEAR REPAIR CRYOTHERAPY Right 11/2017   SPINE SURGERY  02/07/2019   TOTAL KNEE ARTHROPLASTY Left 05/18/2016   Procedure: LEFT TOTAL KNEE ARTHROPLASTY;  Surgeon: Toribio JULIANNA Chancy, MD;  Location: [facility] OR;  Service: Orthopedics;  Laterality: Left;   TOTAL KNEE ARTHROPLASTY Right 09/14/2016   Procedure: TOTAL KNEE ARTHROPLASTY MANIPULATION LEFT KNEE;  Surgeon: Toribio JULIANNA Chancy, MD;  Location: [facility] OR;  Service: Orthopedics;  Laterality: Right;   Social  History   Socioeconomic History   Marital status: Married    Spouse name: Not on file   Number of children: 3   Years of education: Not on file   Highest education level: Not on file  Occupational History   Occupation: Psychologist, occupational -retired  Tobacco Use   Smoking status: Never Smoker   Smokeless tobacco: Never Used  Substance and Sexual Activity   Alcohol use: Yes    Alcohol/week: 3.0 - 4.0 standard drinks    Types: Beer, wine, mixed  Drug use: No   Sexual activity: Yes  Other Topics Concern   Not on file  Social History Narrative   Not on file   Social Determinants of Health   Financial Resource Strain:    Difficulty of Paying Living Expenses: Not on file  Food Insecurity:    Worried About Running Out of Food in the Last Year: Not on file   Ran Out of Food in the Last Year: Not on file  Transportation Needs:    Lack of Transportation (Medical): Not on file   Lack of Transportation (Non-Medical): Not on file  Physical Activity:    Days of Exercise per Week: Not on file   Minutes of Exercise per Session: Not on file  Stress:    Feeling of Stress : Not on file  Social Connections:    Frequency of Communication with Friends and Family: Not on file   Frequency of Social Gatherings with Friends and Family: Not on file   Attends Religious Services: Not on file   Active Member of Clubs or Organizations: Not on file   Attends Banker Meetings: Not on file   Marital Status: Not on file  Intimate Partner Violence:    Fear of Current or Ex-Partner: Not on file   Emotionally Abused: Not on file   Physically Abused: Not on file   Sexually Abused: Not on file   Current Outpatient Medications on File Prior to Visit  Medication Sig Dispense Refill   fenofibrate  160 MG tablet TAKE 1 TABLET BY MOUTH DAILY 90 tablet 0   JATENZO 237 MG CAPS Take 1 capsule by mouth 2 (two) times daily.     lisinopril -hydrochlorothiazide  (ZESTORETIC ) 20-25 MG tablet TAKE 1 TABLET BY MOUTH  DAILY 90 tablet 3   metFORMIN  (GLUCOPHAGE ) 1000 MG tablet Take 1 tablet (1,000 mg total) by mouth 2 (two) times daily with a meal. 180 tablet 3   Current Facility-Administered Medications on File Prior to Visit  Medication Dose Route Frequency Provider Last Rate Last Admin   testosterone  cypionate (DEPOTESTOSTERONE CYPIONATE) injection 200 mg  200 mg Intramuscular Weekly Johnny Garnette LABOR, MD   200 mg at 09/07/22 1008   testosterone  cypionate (DEPOTESTOSTERONE CYPIONATE) injection 200 mg  200 mg Intramuscular Q14 Days Johnny Garnette LABOR, MD   200 mg at 08/31/22 1007   testosterone  cypionate (DEPOTESTOSTERONE CYPIONATE) injection 200 mg  200 mg Intramuscular Once Fry, Stephen A, MD       testosterone  cypionate (DEPOTESTOSTERONE CYPIONATE) injection 200 mg  200 mg Intramuscular Weekly Johnny Garnette LABOR, MD   200 mg at 09/21/22 1145   testosterone  cypionate (DEPOTESTOSTERONE CYPIONATE) injection 200 mg  200 mg Intramuscular Weekly Johnny Garnette LABOR, MD       testosterone  cypionate (DEPOTESTOSTERONE CYPIONATE) injection 200 mg  200 mg Intramuscular Weekly Mercer Clotilda SAUNDERS, MD   200 mg at 10/12/22 1006   testosterone  cypionate (DEPOTESTOSTERONE CYPIONATE) injection 200 mg  200 mg Intramuscular Weekly Johnny Garnette LABOR, MD   200 mg at 10/26/22 1128   No Known Allergies Family History  Problem Relation Age of Onset   Anesthesia problems Father        post-op N/V   Colon cancer Neg Hx    Esophageal cancer Neg Hx    Rectal cancer Neg Hx    Stomach cancer Neg Hx    PE: BP 134/68   Pulse 87   Resp 20   Ht 5' 11 (1.803 m)   Wt 298 lb 3.2 oz (  135.3 kg)   SpO2 96%   BMI 41.59 kg/m  Wt Readings from Last 10 Encounters:  05/15/24 298 lb 3.2 oz (135.3 kg)  11/07/23 295 lb 9.6 oz (134.1 kg)  05/12/23 299 lb (135.6 kg)  05/10/23 299 lb 6.4 oz (135.8 kg)  02/27/23 291 lb (132 kg)  01/03/23 284 lb (128.8 kg)  12/09/22 284 lb 3.2 oz (128.9 kg)  11/07/22 297 lb (134.7 kg)  10/19/22 298 lb 4 oz (135.3 kg)   09/14/22 294 lb 3.2 oz (133.4 kg)   Constitutional: overweight, in NAD Eyes:  no exophthalmos; L eye prosthetic ENT: no neck masses, no cervical lymphadenopathy Cardiovascular: RRR, No MRG Respiratory: CTA B Musculoskeletal: no deformities Skin:no rashes Neurological: no tremor with outstretched hands  ASSESSMENT: 1. DM2, non-insulin -dependent, now controlled, without long-term complications, but with hyperglycemia  2. HL  3.  Obesity class III  PLAN:  1. Patient with longstanding, previously uncontrolled, type 2 diabetes, with improvement in control on metformin  in the last approximately 4 years.  HbA1c at last visit was excellent, stable, at 5.6%.  We did not change his regimen.  Sugars remain at goal at all times of the day.  I refilled his metformin  at last visit. -Today's visit, sugars appear to be very slightly higher than before, with still the majority of the blood sugars at goal.  Upon questioning, he increased his soda intake, drinking 1 soda a day now.  We discussed about the importance of stopping this, but for now, I do not feel we need to intensify his regimen.  -I suggested to: Patient Instructions  Please continue: - Metformin  1000 mg 2x a day with meals  Please return in 6 months with your sugar log.   - we checked his HbA1c: 6.2% (higher) - advised to check sugars at different times of the day - 1x a day, rotating check times - advised for yearly eye exams >> he is UTD -At last visit, we discussed about possibly continuing to follow-up with PCP for this problem but he would prefer to continue to see me for this - return to clinic in 6 months  2. HL - Latest lipid panel was reviewed from 10/2023: LDL and triglycerides at goal, HDL slightly low: Lab Results  Component Value Date   CHOL 137 11/07/2023   HDL 36 (L) 11/07/2023   LDLCALC 78 11/07/2023   LDLDIRECT 86.0 02/01/2018   TRIG 135 11/07/2023   CHOLHDL 3.8 11/07/2023  - He continues fenofibrate  160 mg  daily.  He refused a statin despite a discussion about benefits beyond lowering cholesterol levels.  3.  Obesity class III - He continues on metformin  which has an appetite suppressant effect long-term - We previously discussed about stopping sweet drinks - He lost 4 pounds before last visit and gained 3 pounds since then  Lela Fendt, MD PhD [facility]

## 2024-05-15 NOTE — Patient Instructions (Addendum)
 Please continue: - Metformin  1000 mg 2x a day with meals  STOP SODAS!  Please return in 6 months with your sugar log (before 11/06/2024).

## 2024-05-16 ENCOUNTER — Ambulatory Visit: Payer: Self-pay | Admitting: Internal Medicine

## 2024-05-16 ENCOUNTER — Other Ambulatory Visit: Payer: Self-pay | Admitting: Family Medicine

## 2024-05-16 LAB — MICROALBUMIN / CREATININE URINE RATIO
Creatinine, Urine: 188 mg/dL (ref 20–320)
Microalb Creat Ratio: 5 mg/g{creat} (ref <30)
Microalb, Ur: 1 mg/dL

## 2024-05-16 LAB — POCT GLYCOSYLATED HEMOGLOBIN (HGB A1C): Hemoglobin A1C: 6.2 % — AB (ref 4.0–5.6)

## 2024-05-16 NOTE — Addendum Note (Signed)
 Addended by: ARELIA DIETRICH SAILOR on: 05/16/2024 04:54 PM   Modules accepted: Orders

## 2024-08-11 ENCOUNTER — Other Ambulatory Visit: Payer: Self-pay | Admitting: Family Medicine

## 2024-08-13 ENCOUNTER — Encounter: Payer: Self-pay | Admitting: Family Medicine

## 2024-08-14 NOTE — Telephone Encounter (Signed)
 We have not seen him in a year and a half. Schedule a well exam with labs for next week

## 2024-08-14 NOTE — Telephone Encounter (Signed)
 Patient called in to schedule, provider doesn't have any availability for a wellness exam/ physical til 04/2025. Patient would also like to know also if labs will be fasting. Patient scheduled for regular OV 08/21/23 due to provider availability. Please make applicable changes to appointment if appt type doesn't suffice for provider.

## 2024-08-16 NOTE — Telephone Encounter (Signed)
 Keep this appt time. Have him come in fasting

## 2024-08-16 NOTE — Telephone Encounter (Signed)
 FYI Ok to have pt come in on 08/20/24 for CPE, E2C2 scheduled a CPE on that day since there are no open slots open for Physical.

## 2024-08-20 ENCOUNTER — Ambulatory Visit (INDEPENDENT_AMBULATORY_CARE_PROVIDER_SITE_OTHER): Admitting: Family Medicine

## 2024-08-20 ENCOUNTER — Encounter: Payer: Self-pay | Admitting: Family Medicine

## 2024-08-20 VITALS — BP 130/68 | HR 77 | Temp 98.7°F | Wt 304.0 lb

## 2024-08-20 DIAGNOSIS — E119 Type 2 diabetes mellitus without complications: Secondary | ICD-10-CM | POA: Diagnosis not present

## 2024-08-20 DIAGNOSIS — E785 Hyperlipidemia, unspecified: Secondary | ICD-10-CM

## 2024-08-20 DIAGNOSIS — Z7984 Long term (current) use of oral hypoglycemic drugs: Secondary | ICD-10-CM

## 2024-08-20 DIAGNOSIS — I10 Essential (primary) hypertension: Secondary | ICD-10-CM

## 2024-08-20 DIAGNOSIS — N529 Male erectile dysfunction, unspecified: Secondary | ICD-10-CM

## 2024-08-20 MED ORDER — LISINOPRIL-HYDROCHLOROTHIAZIDE 20-25 MG PO TABS: 1.0000 | ORAL_TABLET | Freq: Every day | ORAL | 3 refills | Status: AC

## 2024-08-20 MED ORDER — FENOFIBRATE 160 MG PO TABS: 160.0000 mg | ORAL_TABLET | Freq: Every day | ORAL | 3 refills | Status: AC

## 2024-08-20 NOTE — Progress Notes (Signed)
" ° °  Subjective:    Patient ID: Jermaine James., male    DOB: Dec 28, 1955, 69 y.o.   MRN: [id number]  HPI Here to follow up on issues. He feels well. His BP has been stable. He sees Dr. Trixie for his type 2 diabetes, and this has been stable. His A1c in October was 6.2%. He had labs in March showing creatinine 0.98, GFR 83, TG 135, and LDL 78. He sees Dr. Lovie for ED, and he is doing well on Jatenzo. He works out at [employer] 3 days a week and he walks on the other days .   Review of Systems  Constitutional: Negative.   Respiratory: Negative.    Cardiovascular: Negative.        Objective:   Physical Exam Constitutional:      Appearance: He is obese.  Cardiovascular:     Rate and Rhythm: Normal rate and regular rhythm.     Pulses: Normal pulses.     Heart sounds: Normal heart sounds.  Pulmonary:     Effort: Pulmonary effort is normal.     Breath sounds: Normal breath sounds.  Musculoskeletal:     Right lower leg: No edema.     Left lower leg: No edema.  Neurological:     Mental Status: He is alert.           Assessment & Plan:  His HTN is well controlled on Lisinopril  hydrochlorothiazide . He will follow up with Dr. Trixie for type 2 diabetes.  He will follow up with Dr. Lovie for ED. His dyslipidemia is stable.  Garnette Olmsted, MD   "

## 2024-11-04 ENCOUNTER — Ambulatory Visit: Admitting: Internal Medicine
